# Patient Record
Sex: Female | Born: 1945 | Race: White | Hispanic: No | Marital: Single | Smoking: Former smoker
Health system: Southern US, Community
[De-identification: ages and names within clinical notes are randomized; demographics above are authoritative.]

## PROBLEM LIST (undated history)

## (undated) DIAGNOSIS — E119 Type 2 diabetes mellitus without complications: Secondary | ICD-10-CM

## (undated) DIAGNOSIS — M1990 Unspecified osteoarthritis, unspecified site: Secondary | ICD-10-CM

## (undated) DIAGNOSIS — D259 Leiomyoma of uterus, unspecified: Secondary | ICD-10-CM

## (undated) DIAGNOSIS — Z86010 Personal history of colonic polyps: Secondary | ICD-10-CM

## (undated) DIAGNOSIS — M810 Age-related osteoporosis without current pathological fracture: Secondary | ICD-10-CM

## (undated) DIAGNOSIS — D049 Carcinoma in situ of skin, unspecified: Secondary | ICD-10-CM

## (undated) DIAGNOSIS — F329 Major depressive disorder, single episode, unspecified: Secondary | ICD-10-CM

## (undated) DIAGNOSIS — Z8742 Personal history of other diseases of the female genital tract: Secondary | ICD-10-CM

## (undated) DIAGNOSIS — Z87412 Personal history of vulvar dysplasia: Secondary | ICD-10-CM

## (undated) DIAGNOSIS — N9489 Other specified conditions associated with female genital organs and menstrual cycle: Secondary | ICD-10-CM

## (undated) DIAGNOSIS — Z86007 Personal history of in-situ neoplasm of skin: Secondary | ICD-10-CM

## (undated) DIAGNOSIS — E538 Deficiency of other specified B group vitamins: Secondary | ICD-10-CM

## (undated) DIAGNOSIS — K219 Gastro-esophageal reflux disease without esophagitis: Secondary | ICD-10-CM

## (undated) DIAGNOSIS — D509 Iron deficiency anemia, unspecified: Secondary | ICD-10-CM

## (undated) DIAGNOSIS — Z9889 Other specified postprocedural states: Secondary | ICD-10-CM

## (undated) DIAGNOSIS — F909 Attention-deficit hyperactivity disorder, unspecified type: Secondary | ICD-10-CM

## (undated) DIAGNOSIS — E785 Hyperlipidemia, unspecified: Secondary | ICD-10-CM

## (undated) DIAGNOSIS — F411 Generalized anxiety disorder: Secondary | ICD-10-CM

## (undated) DIAGNOSIS — L719 Rosacea, unspecified: Secondary | ICD-10-CM

## (undated) DIAGNOSIS — Z87410 Personal history of cervical dysplasia: Secondary | ICD-10-CM

## (undated) DIAGNOSIS — K2270 Barrett's esophagus without dysplasia: Secondary | ICD-10-CM

## (undated) HISTORY — PX: COLONOSCOPY: SHX174

## (undated) HISTORY — PX: CATARACT EXTRACTION W/ INTRAOCULAR LENS  IMPLANT, BILATERAL: SHX1307

## (undated) HISTORY — DX: Carcinoma in situ of skin, unspecified: D04.9

## (undated) HISTORY — DX: Generalized anxiety disorder: F41.1

## (undated) HISTORY — PX: OVARIAN CYST REMOVAL: SHX89

## (undated) HISTORY — PX: VULVECTOMY PARTIAL: SHX6187

## (undated) HISTORY — DX: Hyperlipidemia, unspecified: E78.5

## (undated) HISTORY — PX: BREAST REDUCTION SURGERY: SHX8

## (undated) HISTORY — DX: Gastro-esophageal reflux disease without esophagitis: K21.9

## (undated) HISTORY — DX: Barrett's esophagus without dysplasia: K22.70

## (undated) HISTORY — PX: ANTERIOR CERVICAL DECOMP/DISCECTOMY FUSION: SHX1161

## (undated) HISTORY — DX: Attention-deficit hyperactivity disorder, unspecified type: F90.9

## (undated) HISTORY — DX: Major depressive disorder, single episode, unspecified: F32.9

## (undated) HISTORY — PX: LEEP: SHX91

---

## 1998-03-07 ENCOUNTER — Other Ambulatory Visit: Admission: RE | Admit: 1998-03-07 | Discharge: 1998-03-07 | Payer: Self-pay | Admitting: *Deleted

## 1999-03-14 ENCOUNTER — Other Ambulatory Visit: Admission: RE | Admit: 1999-03-14 | Discharge: 1999-03-14 | Payer: Self-pay | Admitting: *Deleted

## 1999-04-07 ENCOUNTER — Encounter (INDEPENDENT_AMBULATORY_CARE_PROVIDER_SITE_OTHER): Payer: Self-pay | Admitting: Specialist

## 1999-04-07 ENCOUNTER — Other Ambulatory Visit: Admission: RE | Admit: 1999-04-07 | Discharge: 1999-04-07 | Payer: Self-pay | Admitting: *Deleted

## 1999-05-16 ENCOUNTER — Encounter: Admission: RE | Admit: 1999-05-16 | Discharge: 1999-05-16 | Payer: Self-pay | Admitting: Family Medicine

## 1999-05-16 ENCOUNTER — Encounter: Payer: Self-pay | Admitting: Family Medicine

## 1999-09-12 ENCOUNTER — Other Ambulatory Visit: Admission: RE | Admit: 1999-09-12 | Discharge: 1999-09-12 | Payer: Self-pay | Admitting: *Deleted

## 1999-10-28 ENCOUNTER — Ambulatory Visit (HOSPITAL_COMMUNITY): Admission: RE | Admit: 1999-10-28 | Discharge: 1999-10-28 | Payer: Self-pay | Admitting: Gastroenterology

## 1999-10-28 ENCOUNTER — Encounter (INDEPENDENT_AMBULATORY_CARE_PROVIDER_SITE_OTHER): Payer: Self-pay | Admitting: *Deleted

## 2000-03-16 ENCOUNTER — Other Ambulatory Visit: Admission: RE | Admit: 2000-03-16 | Discharge: 2000-03-16 | Payer: Self-pay | Admitting: *Deleted

## 2000-03-18 ENCOUNTER — Other Ambulatory Visit: Admission: RE | Admit: 2000-03-18 | Discharge: 2000-03-18 | Payer: Self-pay | Admitting: *Deleted

## 2000-03-18 ENCOUNTER — Encounter (INDEPENDENT_AMBULATORY_CARE_PROVIDER_SITE_OTHER): Payer: Self-pay

## 2000-08-20 ENCOUNTER — Other Ambulatory Visit: Admission: RE | Admit: 2000-08-20 | Discharge: 2000-08-20 | Payer: Self-pay | Admitting: *Deleted

## 2000-08-20 ENCOUNTER — Encounter (INDEPENDENT_AMBULATORY_CARE_PROVIDER_SITE_OTHER): Payer: Self-pay | Admitting: Specialist

## 2001-03-01 ENCOUNTER — Other Ambulatory Visit: Admission: RE | Admit: 2001-03-01 | Discharge: 2001-03-01 | Payer: Self-pay | Admitting: *Deleted

## 2001-11-26 ENCOUNTER — Encounter: Admission: RE | Admit: 2001-11-26 | Discharge: 2001-11-26 | Payer: Self-pay

## 2002-03-29 ENCOUNTER — Other Ambulatory Visit: Admission: RE | Admit: 2002-03-29 | Discharge: 2002-03-29 | Payer: Self-pay | Admitting: *Deleted

## 2002-09-05 ENCOUNTER — Encounter: Admission: RE | Admit: 2002-09-05 | Discharge: 2002-09-05 | Payer: Self-pay | Admitting: Family Medicine

## 2002-09-05 ENCOUNTER — Encounter: Payer: Self-pay | Admitting: Family Medicine

## 2003-01-12 ENCOUNTER — Encounter: Admission: RE | Admit: 2003-01-12 | Discharge: 2003-01-12 | Payer: Self-pay | Admitting: Neurology

## 2003-05-29 ENCOUNTER — Other Ambulatory Visit: Admission: RE | Admit: 2003-05-29 | Discharge: 2003-05-29 | Payer: Self-pay | Admitting: *Deleted

## 2003-06-03 ENCOUNTER — Encounter: Admission: RE | Admit: 2003-06-03 | Discharge: 2003-06-03 | Payer: Self-pay | Admitting: Neurology

## 2003-06-28 ENCOUNTER — Encounter: Admission: RE | Admit: 2003-06-28 | Discharge: 2003-06-28 | Payer: Self-pay | Admitting: Neurology

## 2003-11-08 ENCOUNTER — Encounter: Admission: RE | Admit: 2003-11-08 | Discharge: 2003-11-08 | Payer: Self-pay | Admitting: Neurology

## 2004-11-24 ENCOUNTER — Ambulatory Visit (HOSPITAL_COMMUNITY): Admission: RE | Admit: 2004-11-24 | Discharge: 2004-11-24 | Payer: Self-pay | Admitting: Specialist

## 2004-11-24 ENCOUNTER — Encounter (INDEPENDENT_AMBULATORY_CARE_PROVIDER_SITE_OTHER): Payer: Self-pay | Admitting: *Deleted

## 2004-11-24 ENCOUNTER — Ambulatory Visit (HOSPITAL_BASED_OUTPATIENT_CLINIC_OR_DEPARTMENT_OTHER): Admission: RE | Admit: 2004-11-24 | Discharge: 2004-11-25 | Payer: Self-pay | Admitting: Specialist

## 2005-02-19 ENCOUNTER — Encounter: Admission: RE | Admit: 2005-02-19 | Discharge: 2005-02-19 | Payer: Self-pay | Admitting: Neurology

## 2005-03-01 ENCOUNTER — Encounter: Admission: RE | Admit: 2005-03-01 | Discharge: 2005-03-01 | Payer: Self-pay | Admitting: Neurology

## 2005-07-10 ENCOUNTER — Ambulatory Visit (HOSPITAL_COMMUNITY): Admission: RE | Admit: 2005-07-10 | Discharge: 2005-07-10 | Payer: Self-pay | Admitting: Emergency Medicine

## 2005-07-22 ENCOUNTER — Encounter: Admission: RE | Admit: 2005-07-22 | Discharge: 2005-07-22 | Payer: Self-pay | Admitting: *Deleted

## 2005-08-20 ENCOUNTER — Encounter: Admission: RE | Admit: 2005-08-20 | Discharge: 2005-08-20 | Payer: Self-pay | Admitting: Family Medicine

## 2005-10-13 ENCOUNTER — Encounter: Admission: RE | Admit: 2005-10-13 | Discharge: 2005-10-13 | Payer: Self-pay | Admitting: Neurology

## 2005-11-12 ENCOUNTER — Ambulatory Visit (HOSPITAL_COMMUNITY): Admission: RE | Admit: 2005-11-12 | Discharge: 2005-11-13 | Payer: Self-pay | Admitting: Neurosurgery

## 2006-06-14 ENCOUNTER — Encounter (INDEPENDENT_AMBULATORY_CARE_PROVIDER_SITE_OTHER): Payer: Self-pay | Admitting: Gastroenterology

## 2006-06-14 ENCOUNTER — Ambulatory Visit (HOSPITAL_COMMUNITY): Admission: RE | Admit: 2006-06-14 | Discharge: 2006-06-14 | Payer: Self-pay | Admitting: Gastroenterology

## 2006-06-14 DIAGNOSIS — Z860100 Personal history of colon polyps, unspecified: Secondary | ICD-10-CM | POA: Insufficient documentation

## 2008-03-02 ENCOUNTER — Emergency Department (HOSPITAL_COMMUNITY): Admission: EM | Admit: 2008-03-02 | Discharge: 2008-03-02 | Payer: Self-pay | Admitting: Emergency Medicine

## 2008-04-11 ENCOUNTER — Ambulatory Visit (HOSPITAL_COMMUNITY): Admission: RE | Admit: 2008-04-11 | Discharge: 2008-04-11 | Payer: Self-pay | Admitting: Interventional Cardiology

## 2008-04-11 ENCOUNTER — Encounter (INDEPENDENT_AMBULATORY_CARE_PROVIDER_SITE_OTHER): Payer: Self-pay | Admitting: Interventional Cardiology

## 2008-04-11 HISTORY — PX: TRANSTHORACIC ECHOCARDIOGRAM: SHX275

## 2008-05-04 ENCOUNTER — Ambulatory Visit (HOSPITAL_COMMUNITY): Admission: RE | Admit: 2008-05-04 | Discharge: 2008-05-04 | Payer: Self-pay | Admitting: Family Medicine

## 2009-02-25 ENCOUNTER — Encounter: Payer: Self-pay | Admitting: Internal Medicine

## 2009-03-08 ENCOUNTER — Ambulatory Visit: Payer: Self-pay | Admitting: Internal Medicine

## 2009-03-08 DIAGNOSIS — R51 Headache: Secondary | ICD-10-CM | POA: Insufficient documentation

## 2009-03-08 DIAGNOSIS — Z860100 Personal history of colon polyps, unspecified: Secondary | ICD-10-CM | POA: Insufficient documentation

## 2009-03-08 DIAGNOSIS — D509 Iron deficiency anemia, unspecified: Secondary | ICD-10-CM | POA: Insufficient documentation

## 2009-03-08 DIAGNOSIS — E785 Hyperlipidemia, unspecified: Secondary | ICD-10-CM

## 2009-03-08 DIAGNOSIS — R519 Headache, unspecified: Secondary | ICD-10-CM | POA: Insufficient documentation

## 2009-03-08 DIAGNOSIS — K219 Gastro-esophageal reflux disease without esophagitis: Secondary | ICD-10-CM | POA: Insufficient documentation

## 2009-03-08 DIAGNOSIS — F329 Major depressive disorder, single episode, unspecified: Secondary | ICD-10-CM

## 2009-03-08 HISTORY — DX: Hyperlipidemia, unspecified: E78.5

## 2009-03-08 LAB — CONVERTED CEMR LAB
ALT: 24 units/L (ref 0–35)
AST: 24 units/L (ref 0–37)
Albumin: 3.9 g/dL (ref 3.5–5.2)
Alkaline Phosphatase: 74 units/L (ref 39–117)
BUN: 9 mg/dL (ref 6–23)
Basophils Absolute: 0 10*3/uL (ref 0.0–0.1)
Basophils Relative: 0.8 % (ref 0.0–3.0)
CO2: 29 meq/L (ref 19–32)
Cholesterol, target level: 200 mg/dL
Creatinine, Ser: 0.8 mg/dL (ref 0.4–1.2)
Eosinophils Absolute: 0.2 10*3/uL (ref 0.0–0.7)
Folate: >20 ng/mL
HCT: 37.3 % (ref 36.0–46.0)
HDL goal, serum: 40 mg/dL
Hemoglobin: 12 g/dL (ref 12.0–15.0)
Iron: 36 ug/dL — ABNORMAL LOW (ref 42–145)
Ketones, ur: NEGATIVE mg/dL
Lymphocytes Relative: 29.6 % (ref 12.0–46.0)
Neutro Abs: 2.7 10*3/uL (ref 1.4–7.7)
Nitrite: NEGATIVE
Platelets: 241 10*3/uL (ref 150.0–400.0)
Potassium: 4 meq/L (ref 3.5–5.1)
RBC: 4.9 M/uL (ref 3.87–5.11)
Saturation Ratios: 8.7 % — ABNORMAL LOW (ref 20.0–50.0)
Sodium: 139 meq/L (ref 135–145)
Specific Gravity, Urine: 1.025 (ref 1.000–1.030)
Total Bilirubin: 0.5 mg/dL (ref 0.3–1.2)
Total Protein: 6.6 g/dL (ref 6.0–8.3)
Urine Glucose: NEGATIVE mg/dL
Urobilinogen, UA: 0.2 (ref 0.0–1.0)
Vitamin B-12: 828 pg/mL (ref 211–911)
pH: 5.5 (ref 5.0–8.0)

## 2009-10-07 ENCOUNTER — Encounter: Payer: Self-pay | Admitting: Internal Medicine

## 2009-10-10 ENCOUNTER — Encounter: Payer: Self-pay | Admitting: Internal Medicine

## 2009-10-10 ENCOUNTER — Ambulatory Visit: Payer: Self-pay | Admitting: Internal Medicine

## 2009-10-10 DIAGNOSIS — B029 Zoster without complications: Secondary | ICD-10-CM | POA: Insufficient documentation

## 2009-10-10 DIAGNOSIS — R1012 Left upper quadrant pain: Secondary | ICD-10-CM | POA: Insufficient documentation

## 2009-10-10 DIAGNOSIS — R079 Chest pain, unspecified: Secondary | ICD-10-CM | POA: Insufficient documentation

## 2009-10-10 LAB — CONVERTED CEMR LAB
ALT: 35 units/L (ref 0–35)
AST: 30 units/L (ref 0–37)
Albumin: 4.3 g/dL (ref 3.5–5.2)
Alkaline Phosphatase: 76 units/L (ref 39–117)
Basophils Relative: 0.4 % (ref 0.0–3.0)
Bilirubin Urine: NEGATIVE
CO2: 32 meq/L (ref 19–32)
Calcium: 10 mg/dL (ref 8.4–10.5)
Eosinophils Absolute: 0.1 10*3/uL (ref 0.0–0.7)
Eosinophils Relative: 1.3 % (ref 0.0–5.0)
GFR calc non Af Amer: 83.2 mL/min (ref >60)
Glucose, Bld: 96 mg/dL (ref 70–99)
Hemoglobin, Urine: NEGATIVE
Leukocytes, UA: NEGATIVE
Lymphocytes Relative: 28.9 % (ref 12.0–46.0)
Monocytes Relative: 9.1 % (ref 3.0–12.0)
Neutrophils Relative %: 60.3 % (ref 43.0–77.0)
Platelets: 275 10*3/uL (ref 150.0–400.0)
RDW: 13.4 % (ref 11.5–14.6)
Total Protein, Urine: NEGATIVE mg/dL
Total Protein: 7 g/dL (ref 6.0–8.3)
Urobilinogen, UA: 0.2 (ref 0.0–1.0)
WBC: 6.3 10*3/uL (ref 4.5–10.5)
pH: 8 (ref 5.0–8.0)

## 2009-11-09 ENCOUNTER — Encounter: Payer: Self-pay | Admitting: Internal Medicine

## 2009-11-09 LAB — HM MAMMOGRAPHY: HM Mammogram: NORMAL

## 2009-11-11 ENCOUNTER — Telehealth: Payer: Self-pay | Admitting: Internal Medicine

## 2009-11-20 ENCOUNTER — Encounter: Payer: Self-pay | Admitting: Internal Medicine

## 2009-12-25 ENCOUNTER — Telehealth: Payer: Self-pay | Admitting: Internal Medicine

## 2010-01-03 ENCOUNTER — Telehealth: Payer: Self-pay | Admitting: Internal Medicine

## 2010-01-03 ENCOUNTER — Ambulatory Visit: Payer: Self-pay | Admitting: Internal Medicine

## 2010-02-16 ENCOUNTER — Encounter: Payer: Self-pay | Admitting: Neurosurgery

## 2010-02-16 ENCOUNTER — Encounter: Payer: Self-pay | Admitting: Family Medicine

## 2010-02-27 NOTE — Progress Notes (Signed)
Summary: SHINGLES  Phone Note Call from Patient Call back at Home Phone [phone]   Summary of Call: Patient's insurance requires rx to get at pharm foro zostavax. Ok to provide?  Initial call taken by: Lamar Sprinkles, CMA,  December 25, 2009 2:24 PM  Follow-up for Phone Call        yes Follow-up by: Etta Grandchild MD,  December 25, 2009 2:31 PM  Additional Follow-up for Phone Call Additional follow up Details #1::        Left detailed vm for pt Additional Follow-up by: Lamar Sprinkles, CMA,  December 25, 2009 3:17 PM    New/Updated Medications: ZOSTAVAX 09811 UNT/0.65ML SOLR (ZOSTER VACCINE LIVE) as directed - administered by pharmacy or Md's office Prescriptions: ZOSTAVAX 91478 UNT/0.65ML SOLR (ZOSTER VACCINE LIVE) as directed - administered by pharmacy or Md's office  #1 x 0   Entered by:   Lamar Sprinkles, CMA   Authorized by:   Etta Grandchild MD   Signed by:   Lamar Sprinkles, CMA on 12/25/2009   Method used:   Electronically to        [facility]* (retail)       [address]       [address] Shipping/mailing       [place], [place]  [address]       Ph: [phone]       Fax: [phone]   RxID:   [id number]

## 2010-02-27 NOTE — Progress Notes (Signed)
  Phone Note Refill Request Message from:  Fax from Pharmacy on January 03, 2010 11:10 AM  Refills Requested: Medication #1:  SIMVASTATIN 40 MG TABS Take 1 tablet by mouth once a day    Prescriptions: SIMVASTATIN 40 MG TABS (SIMVASTATIN) Take 1 tablet by mouth once a day  #90 x 3   Entered by:   Rock Nephew CMA   Authorized by:   Etta Grandchild MD   Signed by:   Rock Nephew CMA on 01/03/2010   Method used:   Electronically to        [facility]* (retail)       [address]       [address] Shipping/mailing       [place], [place]  [address]       Ph: [phone]       Fax: [phone]   RxID:   [id number]

## 2010-02-27 NOTE — Letter (Signed)
Summary: Results Follow-up Letter  [facility]  [address] [place], [place] [address]   Phone: [phone]  Fax: [phone]    03/08/2009  [address] [place], [place]  [address]  Dear Ms. Fauble,   The following are the results of your recent test(s):  Test     Result     B12       normal Iron       slightly low CBC       normal Urine       normal Blood sugar     high Liver/kidney   normal Thyroid     normal   _________________________________________________________  Please call for an appointment soon for further blood sugar testing _________________________________________________________ _________________________________________________________ _________________________________________________________  Sincerely,  Sanda Linger MD [facility]

## 2010-02-27 NOTE — Progress Notes (Signed)
    PAP Screening:    Last PAP smear:  10/02/2009  Mammogram Screening:    Last Mammogram:  11/09/2009  Mammogram Results:    Date of Exam:  11/09/2009    Results:  Normal Bilateral  Osteoporosis Risk Assessment:  Risk Factors for Fracture or Low Bone Density:   Race (White or Asian):     yes   Hx of Fractures:       no   FH of Osteoporosis:     no   Hx of falls:       no   Physically inactive:     no   Smoking status:       never   High alcohol use:     no   Low calcium/Vit. D intake:   no   Corticosteroid use:     no   Heparin use:       no   Thyroid disease:     no   Rheumatoid Arthritis:     no  Immunization & Chemoprophylaxis:    Tetanus vaccine: Tdap  (10/02/2009)

## 2010-02-27 NOTE — Assessment & Plan Note (Signed)
Summary: ZOSTERVAX/INS WILL COVER PER PT-LB  Nurse Visit   Allergies: No Known Drug Allergies  Immunizations Administered:  Zostavax # 1:    Vaccine Type: Zostavax    Site: Left arm     Mfr: Merck    Dose: 0.5 ml    Route: IM    Given by: Lamar Sprinkles, CMA    Exp. Date: 08/30/2010    Lot #: [id number]    VIS given: 11/07/04 given January 03, 2010.  Orders Added: 1)  Zoster (Shingles) Vaccine Live [90736] 2)  Admin 1st Vaccine [id number]

## 2010-02-27 NOTE — Assessment & Plan Note (Signed)
Summary: NEW/ UMR / LOW IRON / NWS #   Vital Signs:  Patient profile:   65 year old female Height:      62 inches Weight:      139.50 pounds BMI:     25.61 O2 Sat:      97 % on Room air Temp:     97.9 degrees F oral Pulse rate:   88 / minute Pulse rhythm:   regular Resp:     16 per minute BP sitting:   134 / 80  (left arm) Cuff size:   large  Vitals Entered By: Rock Nephew CMA (March 08, 2009 9:16 AM)  O2 Flow:  Room air  Primary Care Provider:  Etta Grandchild MD   History of Present Illness: New to me she complains of a one year history of fatigue, anemia, and tingling in her fingers. She is a vegetarian. Her previous PCP Dr. Uvaldo Rising has done an extensive cardiac work-up but those records are not available to me. 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 She feels like it takes a long time for minor cuts to stop bleeding.  Dyspepsia History:      She has no alarm features of dyspepsia including no history of melena, hematochezia, dysphagia, persistent vomiting, or involuntary weight loss > 5%.  There is a prior history of GERD.  The patient does not have a prior history of documented ulcer disease.  The dominant symptom is heartburn or acid reflux.  An H-2 blocker medication is currently being taken.  She notes that the symptoms have improved with the H-2 blocker therapy.  Symptoms have not persisted after 4 weeks of H-2 blocker treatment.    Lipid Management History:      Positive NCEP/ATP III risk factors include female age 58 years old or older.  Negative NCEP/ATP III risk factors include no history of early menopause without estrogen hormone replacement, non-diabetic, no family history for ischemic heart disease, non-tobacco-user status, non-hypertensive, no ASHD (atherosclerotic heart disease), no prior stroke/TIA, no peripheral vascular disease, and no history of aortic aneurysm.        The patient states that she knows about the  "Therapeutic Lifestyle Change" diet.  Her compliance with the TLC diet is excellent.  The patient expresses understanding of adjunctive measures for cholesterol lowering.  Adjunctive measures started by the patient include aerobic exercise, fiber, limit alcohol consumpton, and weight reduction.  She expresses no side effects from her lipid-lowering medication.  The patient denies any symptoms to suggest myopathy or liver disease.      Preventive Screening-Counseling & Management  Alcohol-Tobacco     Smoking Status: never  Caffeine-Diet-Exercise     Does Patient Exercise: yes      Drug Use:  no.    Current Medications (verified): 1)  Simvastatin 40 Mg Tabs (Simvastatin) .... Take 1 Tablet By Mouth Once A Day 2)  Trazodone Hcl 100 Mg Tabs (Trazodone Hcl) .Marland Kitchen.. 1-1/2 Tabs 3)  Etodolac 500 Mg Tabs (Etodolac) .... As Needed 4)  Fluoxetine Hcl 40 Mg Caps (Fluoxetine Hcl) 5)  Deplin 7.5 Mg Tabs (L-Methylfolate) .... Take 1 Tablet By Mouth Once A Day 6)  Adderall 20 Mg Tabs (Amphetamine-Dextroamphetamine) .... 2 Once Daily 7)  Calcium D 8)  Vitamin D 9)  Fish Oil 10)  Methocarbamol 750 Mg Tabs (Methocarbamol) .... As Needed  Allergies (verified): No Known Drug Allergies  Past History:  Past Medical History: Anemia-NOS Colonic polyps, hx of  Depression GERD Hyperlipidemia ADHD Headache  Past Surgical History: Cervical fusion Cervical laminectomy  Family History: Family History of Alcoholism/Addiction Family History High cholesterol Family History Hypertension  Social History: Occupation: Airline pilot Divorced Never Smoked Alcohol use-no Drug use-no Regular exercise-yes Smoking Status:  never Drug Use:  no Does Patient Exercise:  yes  Review of Systems  The patient denies anorexia, fever, weight loss, weight gain, chest pain, dyspnea on exertion, peripheral edema, prolonged cough, headaches, hemoptysis, abdominal pain, melena, hematochezia, severe  indigestion/heartburn, hematuria, suspicious skin lesions, depression, unusual weight change, abnormal bleeding, enlarged lymph nodes, angioedema, and breast masses.   Heme:  Complains of bleeding; denies abnormal bruising, enlarge lymph nodes, fevers, pallor, and skin discoloration.  Physical Exam  General:  Well-developed,well-nourished,in no acute distress; alert,appropriate and cooperative throughout examination Eyes:  No corneal or conjunctival inflammation noted. EOMI. Perrla. Funduscopic exam benign, without hemorrhages, exudates or papilledema. Vision grossly normal. Mouth:  Oral mucosa and oropharynx without lesions or exudates.  Teeth in good repair. Neck:  No deformities, masses, or tenderness noted. Lungs:  Normal respiratory effort, chest expands symmetrically. Lungs are clear to auscultation, no crackles or wheezes. Heart:  Normal rate and regular rhythm. S1 and S2 normal without gallop, murmur, click, rub or other extra sounds. Abdomen:  Bowel sounds positive,abdomen soft and non-tender without masses, organomegaly or hernias noted. Rectal:  No external abnormalities noted. Normal sphincter tone. No rectal masses or tenderness. heme negative stool. Msk:  normal ROM, no joint tenderness, no joint swelling, no joint warmth, no redness over joints, no joint deformities, no joint instability, and no crepitation.   Pulses:  R and L carotid,radial,femoral,dorsalis pedis and posterior tibial pulses are full and equal bilaterally Extremities:  No clubbing, cyanosis, edema, or deformity noted with normal full range of motion of all joints.   Neurologic:  No cranial nerve deficits noted. Station and gait are normal. Plantar reflexes are down-going bilaterally. DTRs are symmetrical throughout. Sensory, motor and coordinative functions appear intact. Skin:  Intact without suspicious lesions or rashes Cervical Nodes:  no anterior cervical adenopathy and no posterior cervical adenopathy.     Axillary Nodes:  no R axillary adenopathy and no L axillary adenopathy.   Inguinal Nodes:  no R inguinal adenopathy and no L inguinal adenopathy.   Psych:  Cognition and judgment appear intact. Alert and cooperative with normal attention span and concentration. No apparent delusions, illusions, hallucinations   Impression & Recommendations:  Problem # 1:  ANEMIA-NOS (ICD-285.9) Assessment New  Orders: Venipuncture ([address]) TLB-B12 + Folate Pnl (60454_09811-B14/NWG) TLB-IBC Pnl (Iron/FE;Transferrin) (83550-IBC) TLB-BMP (Basic Metabolic Panel-BMET) (80048-METABOL) TLB-CBC Platelet - w/Differential (85025-CBCD) TLB-Hepatic/Liver Function Pnl (80076-HEPATIC) TLB-TSH (Thyroid Stimulating Hormone) (84443-TSH) TLB-Udip w/ Micro (81001-URINE) Hemoccult Guaiac-1 spec.(in office) ([address]70)  Problem # 2:  GERD (ICD-530.81) Assessment: Improved  Her updated medication list for this problem includes:    Prilosec 20 Mg Cpdr (Omeprazole) ..... Once daily  Orders: Venipuncture ([address]) TLB-B12 + Folate Pnl (30865_78469-G29/BMW) TLB-IBC Pnl (Iron/FE;Transferrin) (83550-IBC) TLB-BMP (Basic Metabolic Panel-BMET) (80048-METABOL) TLB-CBC Platelet - w/Differential (85025-CBCD) TLB-Hepatic/Liver Function Pnl (80076-HEPATIC) TLB-TSH (Thyroid Stimulating Hormone) (84443-TSH) TLB-Udip w/ Micro (81001-URINE) Hemoccult Guaiac-1 spec.(in office) (82270)  Complete Medication List: 1)  Simvastatin 40 Mg Tabs (Simvastatin) .... Take 1 tablet by mouth once a day 2)  Trazodone Hcl 100 Mg Tabs (Trazodone hcl) .Marland Kitchen.. 1-1/2 tabs 3)  Etodolac 500 Mg Tabs (Etodolac) .... As needed 4)  Fluoxetine Hcl 40 Mg Caps (Fluoxetine hcl) 5)  Deplin 7.5 Mg Tabs (L-methylfolate) .... Take 1 tablet  by mouth once a day 6)  Adderall 20 Mg Tabs (Amphetamine-dextroamphetamine) .... 2 once daily 7)  Calcium D  8)  Vitamin D  9)  Fish Oil  10)  Methocarbamol 750 Mg Tabs (Methocarbamol) .... As needed 11)  Prilosec 20 Mg Cpdr  (Omeprazole) .... Once daily  Lipid Assessment/Plan:      Based on NCEP/ATP III, the patient's risk factor category is "0-1 risk factors".  The patient's lipid goals are as follows: Total cholesterol goal is 200; LDL cholesterol goal is 160; HDL cholesterol goal is 40; Triglyceride goal is 150.    Colorectal Screening:  Colonoscopy Results:    Date of Exam: 05/09/2008    Results: Adenomatous Polyp  PAP Screening:    Hx Cervical Dysplasia in last 5 yrs? No    3 normal PAP smears in last 5 yrs? Yes    Last PAP smear:  07/11/2008  PAP Smear Results:    Date of Exam:  07/11/2008    Results:  Normal  Mammogram Screening:    Last Mammogram:  06/06/2008  Mammogram Results:    Date of Exam:  06/06/2008    Results:  Normal Bilateral  Osteoporosis Risk Assessment:  Risk Factors for Fracture or Low Bone Density:   Race (White or Asian):     yes   Hx of Fractures:       no   FH of Osteoporosis:     no   Hx of falls:       no   Physically inactive:     no   Smoking status:       never   High alcohol use:     no   High caffeine use:     no   Low calcium/Vit. D intake:   no   Corticosteroid use:     no   Thyroid replacement:     no   Dilantin use:       no   Heparin use:       no   Thyroid disease:     no   Rheumatoid Arthritis:     no   Parathyroid disease:     no  Patient Instructions: 1)  Please schedule a follow-up appointment in 2 months. 2)  Schedule your mammogram. 3)  Schedule a colonoscopy/sigmoidoscopy to help detect colon cancer. 4)  You need to have a Pap Smear to prevent cervical cancer.

## 2010-02-27 NOTE — Letter (Signed)
Summary: New Patient letter  [facility]  [address] [place], [place] [address]   Phone: [phone]  Fax: [phone]       10/07/2009 MRN: [id number]  [facility] [address] [place], [place]  [address]  Dear Ms. Brooke Dare,  Welcome to the Gastroenterology Division at [facility].    You are scheduled to see Dr.  Stan Head on Sept 19, 2011 at 1:45pm on the 3rd floor at [facility], [address]  We ask that you try to arrive at our office 15 minutes prior to your appointment time to allow for check-in.  We would like you to complete the enclosed self-administered evaluation form prior to your visit and bring it with you on the day of your appointment.  We will review it with you.  Also, please bring a complete list of all your medications or, if you prefer, bring the medication bottles and we will list them.  Please bring your insurance card so that we may make a copy of it.  If your insurance requires a referral to see a specialist, please bring your referral form from your primary care physician.  Co-payments are due at the time of your visit and may be paid by cash, check or credit card.     Your office visit will consist of a consult with your physician (includes a physical exam), any laboratory testing he/she may order, scheduling of any necessary diagnostic testing (e.g. x-ray, ultrasound, CT-scan), and scheduling of a procedure (e.g. Endoscopy, Colonoscopy) if required.  Please allow enough time on your schedule to allow for any/all of these possibilities.    If you cannot keep your appointment, please call [phone] to cancel or reschedule prior to your appointment date.  This allows [place] the opportunity to schedule an appointment for another patient in need of care.  If you do not cancel or reschedule by 5 p.m. the business day prior to your appointment date, you will be charged a $50.00 late cancellation/no-show fee.    Thank you for choosing  [facility] for your medical needs.  We appreciate the opportunity to care for you.  Please visit [place] at our website  to learn more about our practice.                     Sincerely,                                                             The Gastroenterology Division

## 2010-02-27 NOTE — Assessment & Plan Note (Signed)
Summary: stomach issues,offered sda requested 9/15/cd   Vital Signs:  Patient profile:   65 year old female Height:      62 inches Weight:      137.50 pounds O2 Sat:      96 % on Room air Temp:     98.1 degrees F oral Pulse rate:   94 / minute Pulse rhythm:   regular Resp:     16 per minute BP sitting:   118 / 82  (left arm) Cuff size:   large  Vitals Entered By: Rock Nephew CMA (October 10, 2009 9:54 AM)  O2 Flow:  Room air CC: Pt c/o Left side rib pain, Preventive Care   Primary Care Provider:  Etta Grandchild MD  CC:  Pt c/o Left side rib pain and Preventive Care.  History of Present Illness: She returns c/o a 2 week hx of left lower rib cage pain with rash and itching in that area.  Preventive Screening-Counseling & Management  Alcohol-Tobacco     Alcohol drinks/day: 0     Alcohol Counseling: not indicated; patient does not drink     Smoking Status: never     Tobacco Counseling: not indicated; no tobacco use  Hep-HIV-STD-Contraception     Hepatitis Risk: no risk noted     HIV Risk: no risk noted     STD Risk: no risk noted      Sexual History:  currently monogamous.        Drug Use:  no.        Blood Transfusions:  no.    Medications Prior to Update: 1)  Simvastatin 40 Mg Tabs (Simvastatin) .... Take 1 Tablet By Mouth Once A Day 2)  Trazodone Hcl 100 Mg Tabs (Trazodone Hcl) .Marland Kitchen.. 1-1/2 Tabs 3)  Etodolac 500 Mg Tabs (Etodolac) .... As Needed 4)  Fluoxetine Hcl 40 Mg Caps (Fluoxetine Hcl) 5)  Deplin 7.5 Mg Tabs (L-Methylfolate) .... Take 1 Tablet By Mouth Once A Day 6)  Adderall 20 Mg Tabs (Amphetamine-Dextroamphetamine) .... 2 Once Daily 7)  Calcium D 8)  Vitamin D 9)  Fish Oil 10)  Methocarbamol 750 Mg Tabs (Methocarbamol) .... As Needed 11)  Prilosec 20 Mg Cpdr (Omeprazole) .... Once Daily  Current Medications (verified): 1)  Simvastatin 40 Mg Tabs (Simvastatin) .... Take 1 Tablet By Mouth Once A Day 2)  Trazodone Hcl 100 Mg Tabs (Trazodone Hcl)  .Marland Kitchen.. 1-1/2 Tabs 3)  Etodolac 500 Mg Tabs (Etodolac) .... As Needed 4)  Fluoxetine Hcl 40 Mg Caps (Fluoxetine Hcl) 5)  Deplin 7.5 Mg Tabs (L-Methylfolate) .... Take 1 Tablet By Mouth Once A Day 6)  Adderall 20 Mg Tabs (Amphetamine-Dextroamphetamine) .... 2 Once Daily 7)  Calcium D 8)  Vitamin D 9)  Fish Oil 10)  Methocarbamol 750 Mg Tabs (Methocarbamol) .... As Needed 11)  Prilosec 20 Mg Cpdr (Omeprazole) .... Once Daily 12)  Acyclovir 800 Mg Tabs (Acyclovir) .... One By Mouth Three Times A Day For 10 Days  Allergies (verified): No Known Drug Allergies  Past History:  Past Medical History: Last updated: 03/08/2009 Anemia-NOS Colonic polyps, hx of Depression GERD Hyperlipidemia ADHD Headache  Past Surgical History: Last updated: 03/08/2009 Cervical fusion Cervical laminectomy  Family History: Last updated: 03/08/2009 Family History of Alcoholism/Addiction Family History High cholesterol Family History Hypertension  Social History: Last updated: 03/08/2009 Occupation: Psychotherapist Divorced Never Smoked Alcohol use-no Drug use-no Regular exercise-yes  Risk Factors: Alcohol Use: 0 (10/10/2009) Exercise: yes (03/08/2009)  Risk Factors: Smoking Status: never (  10/10/2009)  Family History: Reviewed history from 03/08/2009 and no changes required. Family History of Alcoholism/Addiction Family History High cholesterol Family History Hypertension  Social History: Reviewed history from 03/08/2009 and no changes required. Occupation: Psychotherapist Divorced Never Smoked Alcohol use-no Drug use-no Regular exercise-yes Hepatitis Risk:  no risk noted HIV Risk:  no risk noted STD Risk:  no risk noted Sexual History:  currently monogamous Blood Transfusions:  no  Review of Systems       The patient complains of chest pain and suspicious skin lesions.  The patient denies anorexia, fever, weight loss, weight gain, syncope, dyspnea on exertion, peripheral  edema, prolonged cough, headaches, hemoptysis, abdominal pain, melena, hematochezia, severe indigestion/heartburn, hematuria, genital sores, muscle weakness, enlarged lymph nodes, angioedema, and breast masses.   CV:  Complains of chest pain or discomfort; denies difficulty breathing at night, fainting, fatigue, leg cramps with exertion, lightheadness, near fainting, palpitations, shortness of breath with exertion, swelling of feet, swelling of hands, and weight gain. Derm:  Complains of itching and rash; denies changes in nail beds, dryness, excessive perspiration, flushing, hair loss, insect bite(s), lesion(s), and poor wound healing.  Physical Exam  General:  alert, well-developed, well-nourished, well-hydrated, appropriate dress, normal appearance, healthy-appearing, cooperative to examination, and good hygiene.   Head:  normocephalic and atraumatic.   Eyes:  vision grossly intact, pupils equal, pupils round, and pupils reactive to light.   Ears:  R ear normal and L ear normal.   Mouth:  Oral mucosa and oropharynx without lesions or exudates.  Teeth in good repair. Neck:  supple, full ROM, no masses, no thyromegaly, no thyroid nodules or tenderness, no JVD, normal carotid upstroke, no carotid bruits, no cervical lymphadenopathy, and no neck tenderness.   Chest Wall:  no deformities, no tenderness, and no mass.   Lungs:  normal respiratory effort, no intercostal retractions, no accessory muscle use, normal breath sounds, no dullness, no fremitus, no crackles, and no wheezes.   Heart:  normal rate, regular rhythm, no murmur, no gallop, no rub, and no JVD.   Abdomen:  soft, non-tender, normal bowel sounds, no distention, no masses, no guarding, no rigidity, no rebound tenderness, no abdominal hernia, no inguinal hernia, no hepatomegaly, and no splenomegaly.   Rectal:  No external abnormalities noted. Normal sphincter tone. No rectal masses or tenderness. Msk:  normal ROM, no joint tenderness, no  joint swelling, no joint warmth, no redness over joints, no joint deformities, no joint instability, no crepitation, and no muscle atrophy.   Pulses:  R and L carotid,radial,femoral,dorsalis pedis and posterior tibial pulses are full and equal bilaterally Extremities:  No clubbing, cyanosis, edema, or deformity noted with normal full range of motion of all joints.   Neurologic:  No cranial nerve deficits noted. Station and gait are normal. Plantar reflexes are down-going bilaterally. DTRs are symmetrical throughout. Sensory, motor and coordinative functions appear intact. Skin:  excessive tan.  over the left lower rib cage in a dermatomal pattern there are faint/rare/scattered excoriations and tiny scabbed papules. there are no vesicles, pustules, induration, erythema, Cervical Nodes:  no anterior cervical adenopathy and no posterior cervical adenopathy.   Axillary Nodes:  no R axillary adenopathy and no L axillary adenopathy.   Inguinal Nodes:  no R inguinal adenopathy and no L inguinal adenopathy.   Psych:  Oriented X3, memory intact for recent and remote, normally interactive, good eye contact, not depressed appearing, not agitated, not suicidal, not homicidal, and slightly anxious.   Additional Exam:  EKG is  normal.   Impression & Recommendations:  Problem # 1:  SHINGLES (ICD-053.9) Assessment New will start Acyclovir  Problem # 2:  CHEST PAIN (ICD-786.50) Assessment: New will check a Chest Xray and screen for PE. Orders: T-D-Dimer Fibrin Derivatives Quantitive [id number]) T-2 View CXR (71020TC) EKG w/ Interpretation (93000)  Problem # 3:  ABDOMINAL PAIN, LEFT UPPER QUADRANT (ICD-789.02) Assessment: New  Orders: Venipuncture (09811) TLB-BMP (Basic Metabolic Panel-BMET) (80048-METABOL) TLB-CBC Platelet - w/Differential (85025-CBCD) TLB-Hepatic/Liver Function Pnl (80076-HEPATIC) TLB-TSH (Thyroid Stimulating Hormone) (84443-TSH) TLB-Udip w/ Micro (81001-URINE) Hemoccult  Guaiac-1 spec.(in office) (82270)  Complete Medication List: 1)  Simvastatin 40 Mg Tabs (Simvastatin) .... Take 1 tablet by mouth once a day 2)  Trazodone Hcl 100 Mg Tabs (Trazodone hcl) .Marland Kitchen.. 1-1/2 tabs 3)  Etodolac 500 Mg Tabs (Etodolac) .... As needed 4)  Fluoxetine Hcl 40 Mg Caps (Fluoxetine hcl) 5)  Deplin 7.5 Mg Tabs (L-methylfolate) .... Take 1 tablet by mouth once a day 6)  Adderall 20 Mg Tabs (Amphetamine-dextroamphetamine) .... 2 once daily 7)  Calcium D  8)  Vitamin D  9)  Fish Oil  10)  Methocarbamol 750 Mg Tabs (Methocarbamol) .... As needed 11)  Prilosec 20 Mg Cpdr (Omeprazole) .... Once daily 12)  Acyclovir 800 Mg Tabs (Acyclovir) .... One by mouth three times a day for 10 days  Colorectal Screening:  Current Recommendations:    Hemoccult: NEG X 1 today  PAP Screening:    Hx Cervical Dysplasia in last 5 yrs? No    3 normal PAP smears in last 5 yrs? Yes    Last PAP smear:  10/02/2009    Reviewed PAP smear recommendations:  patient defers to GYN provider  PAP Smear Results:    Date of Exam:  10/02/2009    Results:  Normal  Mammogram Screening:    Last Mammogram:  10/01/2009    Reviewed Mammogram recommendations:  mammogram not due yet  Mammogram Results:    Date of Exam:  10/01/2009    Results:  Normal Bilateral  Osteoporosis Risk Assessment:  Risk Factors for Fracture or Low Bone Density:   Race (White or Asian):     yes   Hx of Fractures:       no   FH of Osteoporosis:     no   Hx of falls:       no   Physically inactive:     no   Smoking status:       never   High alcohol use:     no   Low calcium/Vit. D intake:   no   Corticosteroid use:     no   Heparin use:       no   Thyroid disease:     no   Rheumatoid Arthritis:     no  Immunization & Chemoprophylaxis:    Tetanus vaccine: Tdap  (10/02/2009)  Patient Instructions: 1)  Please schedule a follow-up appointment in 2 weeks. 2)  Take 650-1000mg  of Tylenol every 4-6 hours as needed for relief  of pain or comfort of fever AVOID taking more than 4000mg   in a 24 hour period (can cause liver damage in higher doses). Prescriptions: ACYCLOVIR 800 MG TABS (ACYCLOVIR) One by mouth three times a day for 10 days  #30 x 1   Entered and Authorized by:   Etta Grandchild MD   Signed by:   Etta Grandchild MD on 10/10/2009   Method used:   Electronically to  [facility]* (retail)       [address]       [address] Shipping/mailing       [place], [place]  [address]       Ph: [phone]       Fax: [phone]   RxID:   [id number]    Tetanus/Td Immunization History:    Tetanus/Td # 1:  Tdap (10/02/2009)

## 2010-02-27 NOTE — Letter (Signed)
Summary: [facility] No Show Letter  [facility]  [address] [place], [place] [address]   Phone: [phone]  Fax: [phone]      November 20, 2009 MRN: [id number] [facility] [address] [place], [place]  [address]   [facility] [address] [place], [place] [address]     You were scheduled for an Consultation appointment with Dr.Gessner on 10-14-09 at  [facility] but you did not keep the appointment.    Your provider recommended this procedure for the benefit of your health.  It is very important that you reschedule it.  Failure to do so may be to the detriment of your health.  Please call us at [phone] and we will be happy to assist you with rescheduling.    If you were referred for this procedure by another physician/provider, we will notify him/her that you did not keep your appointment.   Sincerely,  [facility]

## 2010-06-10 NOTE — Op Note (Signed)
NAME:  Jill Chandler, Jill Chandler                  ACCOUNT NO.:  [id number]   MEDICAL RECORD NO.:  [id number]          PATIENT TYPE:  AMB   LOCATION:  ENDO                         FACILITY:  [facility]   PHYSICIAN:  Anselmo Rod, M.D.  DATE OF BIRTH:  15-Nov-1945   DATE OF PROCEDURE:  06/14/2006  DATE OF DISCHARGE:                               OPERATIVE REPORT   PROCEDURE PERFORMED:  Colonoscopy with cold biopsies x3.   ENDOSCOPIST:  Anselmo Rod, M.D.   INSTRUMENT USED:  Pentax video colonoscope.   INDICATIONS FOR PROCEDURE:  A 65 year old white female undergoing  screening colonoscopy to rule out colonic polyps, mass, etc.   PREPROCEDURE PREPARATION:  Informed consent was procured from the  patient.  The patient fasted for 4 hours prior to the procedure and  prepped with 20 Osmoprep pills the night of and 12 Osmoprep pills the  morning of the procedure.  Risks and benefits of the procedure including  a 10% miss rate of cancer and polyps were discussed with the patient as  well.   PREPROCEDURE PHYSICAL:  VITAL SIGNS:  The patient had stable vital  signs.  NECK:  Supple.  CHEST:  Clear to auscultation.  HEART:  S1 and S2 regular.  ABDOMEN:  Soft with normal bowel sounds.   DESCRIPTION OF PROCEDURE:  The patient was placed in the left lateral  decubitus position and sedated with a 125 mcg of Fentanyl and 10 mg of  Versed given intravenously in slow incremental doses.  Once the patient  was adequately sedated and maintained on low-flow oxygen and continuous  cardiac monitoring, the Pentax video colonoscope was advanced from the  rectum to the cecum.  There was evidence of diverticulosis with more  prominent changes in the left colon and small internal hemorrhoids were  seen on retroflexion.  Two polyps were biopsied from the proximal right  colon, one close to the cecum and one from the mid right colon.  The  terminal ileum appeared healthy without lesions.  Retroflexion in the  rectum  revealed internal hemorrhoids as mentioned above.  The patient  tolerated the procedure well without immediate complications.   IMPRESSION:  1. Scattered diverticulosis, more prominent changes in the sigmoid      colon.  2. Two the small sessile polyps biopsied from the proximal right colon      and one from the mid right colon.  3. Small internal hemorrhoids seen on retroflexion.  4. Normal terminal ileum.   RECOMMENDATIONS:  1. Await pathology results.  2. Avoid all nonsteroidals including aspirin for the next 2 weeks.  3. Repeat colonoscopy depending on pathology results.  4. Continue high fiber diet with liberal fluid intake.  5. Outpatient follow-up as need arises in the future.      Anselmo Rod, M.D.  Electronically Signed     JNM/MEDQ  D:  06/14/2006  T:  06/15/2006  Job:  [id number]   cc:   Pam Drown, M.D.

## 2010-06-13 NOTE — Op Note (Signed)
NAME:  Jill Chandler, Jill Chandler                  ACCOUNT NO.:  [id number]   MEDICAL RECORD NO.:  [id number]          PATIENT TYPE:  OIB   LOCATION:  3016                         FACILITY:  [facility]   PHYSICIAN:  Danae Orleans. Venetia Maxon, M.D.  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   PROCEDURE:  Anterior cervical decompression and fusion, C3-4, with PEEK  interbody cage, morcellized bone autograft and anterior cervical plate.   SURGEON:  Danae Orleans. Venetia Maxon, M.D.   ASSISTANTS:  Cristi Loron, M.D.; Poteat, RN.   ANESTHESIA:  General endotracheal anesthesia.   ESTIMATED BLOOD LOSS:  Minimal.   COMPLICATIONS:  None.   DISPOSITION:  To recovery.   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  She has a mobile spondylolisthesis of C3 on C4, with  significant right foraminal stenosis.  We have elected to take her to  surgery for anterior cervical decompression and fusion at the C3-4 level.   DESCRIPTION OF PROCEDURE:  Jill Chandler was brought to the operating room.  Following the satisfactory and uncomplicated induction of general  endotracheal anesthesia and placement of an intravenous line, she was placed  in the supine position on the operating room table.  Her neck was placed in  slight extension.  She was placed in 5 pounds of halter traction and her  anterior neck was then prepped and draped in the usual sterile fashion.  The  area of the planned incision was infiltrated with 0.25% Marcaine and 0.5%  lidocaine with 1:200,000 epinephrine.  Incision was made from the midline to  the anterior border of the sternocleidomastoid on the  left side of the  midline.  This was carried through the platysma layer, and then, using blunt  dissection, the carotid sheath was kept lateral, and the trachea and  esophagus kept medial, exposing the anterior cervical spine.  A bent spinal  needle was placed at what was felt to be the C3-4 level, and this was  confirmed on intraoperative x-ray.  Subsequently, the longus colli muscles  were taken down from the anterior cervical spine using the electrocautery,  key elevator and the self-retaining Shadowline retractors, placed to  facilitate exposure.  The interspace at C3-4 was then incised with a 15  blade and disk material was removed in a piecemeal fashion.  Endplates were  stripped of residual disk material.  Disk space spreaders were placed, and  under loupe magnification using a high-speed drill, the endplates were  decorticated a C3 and C4 and uncinate spurs were drilled down, particularly  on the right side of the midline.  This bone was saved for later use in bone  grafting.  Subsequently, a microscope was brought into the field.  The  posterior longitudinal ligament was then incised with an arachnoid  knife and  removed in piecemeal fashion, and the spinal cord dura and both C4 nerve  roots were decompressed as they extended through the neural foramina.  Hemostasis was assured with Gelfoam soaked in thrombin.  After trial sizing,  a 7-mm PEEK interbody cage was selected, packed with morcellized bone  autograft, inserted in the interspace and countersunk appropriately.  Subsequently, the traction weight was removed and a 12-mm Trestle anterior  cervical plate was affixed to the anterior cervical spine using 12-mm  variable-angle screws, 2 at C3 and 2 at C4.  All screws had excellent  purchase.  Locking mechanisms were engaged.  Final x-ray demonstrated a well-  positioned interbody graft and anterior cervical plate.  The soft tissues  were inspected and found to be in good repair,  and the wound was irrigated  with Bacitracin and saline.  The platysma layer was consulted with 3-0  Vicryl suture.  The skin edges were approximated with 3-0 Vicryl  interrupted, inverted sutures.  The wound was dressed with Dermabond.  The  patient was extubated in the operating room and taken to the recovery room  in stable, satisfactory condition, having tolerated the operation well.  All  counts were correct at the end of the case.      Danae Orleans. Venetia Maxon, M.D.  Electronically Signed     JDS/MEDQ  D:  11/12/2005  T:  11/13/2005  Job:  [id number]   cc:   Cristi Loron, M.D.

## 2010-06-13 NOTE — Op Note (Signed)
[place]  Patient:    Jill Chandler, Jill Chandler                         MRN: [id number] Proc. Date: 10/29/99 Adm. Date:  [id number] Attending:  Charna Elizabeth CC:         Pershing Cox, M.D.   Operative Report  DATE OF BIRTH:  [birth year].  PROCEDURE PERFORMED:  Colonoscopy with hot biopsy x 1.  ENDOSCOPIST:  Anselmo Rod, M.D.  INSTRUMENT:  Olympus video colonoscope.  INDICATION FOR PROCEDURE:  Blood in stool and left lower quadrant pain in a 65 year old female.  Rule out colonic polyps, masses, hemorrhoids, etc.  PREPROCEDURE PREPARATION:  Informed consent was procured from the patient. The patient was fasting for eight hours prior to the procedure and prepped with a bottle of magnesium citrate and a gallon of NuLYTELY the night prior to the procedure.  PREPROCEDURE PHYSICAL:  VITAL SIGNS:  Stable.  NECK:  Supple.  CHEST:  Clear to auscultation.  HEART:  S1, S2 regular.  ABDOMEN:  Soft with normal abdominal bowel sounds.  DESCRIPTION OF THE PROCEDURE:  The patient was placed in the left lateral decubitus position and sedated with 75 mg of Demerol and 5 mg of Versed intravenously.  Once the patient was adequately sedated and maintained on low-flow oxygen and continuous cardiac monitoring, the Olympus video colonoscope was advanced from the rectum to the cecum without difficulty.  A small sessile polyp was removed by hot biopsy at 10 cm.  There were a few left-sided diverticula present.  No other abnormalities were seen up to the cecum.  IMPRESSION: 1. One small sessile polyp removed by hot biopsy at 10 cm. 2. Few left-sided diverticulum. 3. Normal-appearing transverse colon, right colon, and cecum.  RECOMMENDATIONS: 1. Await pathology results. 1. Increase the fluid and fiber in diet. 2. Outpatient followup in the next two weeks. DD:  10/29/99 TD:  10/29/99 Job: [id number]

## 2010-06-13 NOTE — Op Note (Signed)
NAME:  LYVONNE, CASSELL                  ACCOUNT NO.:  [id number]   MEDICAL RECORD NO.:  [id number]          PATIENT TYPE:  AMB   LOCATION:  DSC                          FACILITY:  [facility]   PHYSICIAN:  Earvin Hansen L. Shon Hough, M.D.DATE OF BIRTH:  Oct 06, 1945   DATE OF PROCEDURE:  11/24/2004  DATE OF DISCHARGE:                                 OPERATIVE REPORT   INDICATIONS FOR PROCEDURE:  This is a 65 year old lady who has severe  macromastia, back and shoulder pain secondary to large pendulous breasts,  history of intertriginous changes as well as pain increased on the neck and  back areas.  All alternative conservative treatments have been attempted and  failed.   PROCEDURE:  Bilateral breast reductions using the inferior pedicle  technique.   ANESTHESIA:  General.   DESCRIPTION OF PROCEDURE:  Preoperatively, the patient was sat up and drawn  for the inferior pedicle reduction mammoplasty.  She will be marked at 20 cm  from the suprasternal notch.  She then underwent general anesthesia and was  intubated orally.  Prep was done to the chest, breasts, neck, and abdominal  areas using Hibiclens soap and solution and walled off with sterile towels  and drapes so as to make a sterile field.   Xylocaine 4% with epinephrine was injected locally for vasoconstriction,  1:400,000 concentration, a total of 150 cc per side.  We allowed this to  vasoconstrict.  The incisions were the made with #15 blades.  The skin over  the inferior pedicle was deepithelialized with #20 blades.  Medial and  lateral fatty dermal pedicles were excised down the underlying fascia.  After hemostasis, the new keyhole area was also debulked out laterally.  More accessory breast tissue was taken to improve symmetry.  After  hemostasis and irrigation, the flaps were transposed and stayed with 3-0  Prolene.  Subcutaneous closure was done with 3-0 Monocryl x2 layers and then  a running subcuticular stitch of 3-0 Monocryl and 5-0  Monocryl throughout  the inverted T.  The wounds were drained with a #10 fully fluted JP drain  which was placed in the depths of the wound and brought out through the  lateral-most portion of the incision and secured with 3-0 Prolene.  The  wounds were cleansed.  Steri-Strips and soft dressings were applied,  including Xeroform, 4 x 4s, ABD, and Hypafix tape which was taped and cut.  At the end of the procedure, the nipple-areolar complexes were examined,  showing excellent blood supply with pink coloration.   She was then taken to recovery in excellent condition.      Yaakov Guthrie. Shon Hough, M.D.  Electronically Signed     GLT/MEDQ  D:  11/24/2004  T:  11/24/2004  Job:  [id number]

## 2010-12-16 LAB — HM DIABETES EYE EXAM

## 2011-01-13 ENCOUNTER — Encounter: Payer: Self-pay | Admitting: Internal Medicine

## 2011-01-13 ENCOUNTER — Other Ambulatory Visit (INDEPENDENT_AMBULATORY_CARE_PROVIDER_SITE_OTHER): Payer: PRIVATE HEALTH INSURANCE

## 2011-01-13 ENCOUNTER — Ambulatory Visit (INDEPENDENT_AMBULATORY_CARE_PROVIDER_SITE_OTHER): Payer: PRIVATE HEALTH INSURANCE | Admitting: Internal Medicine

## 2011-01-13 ENCOUNTER — Ambulatory Visit (INDEPENDENT_AMBULATORY_CARE_PROVIDER_SITE_OTHER)
Admission: RE | Admit: 2011-01-13 | Discharge: 2011-01-13 | Disposition: A | Discharge status: Home / Self Care | Payer: PRIVATE HEALTH INSURANCE | Source: Ambulatory Visit | Attending: Internal Medicine | Admitting: Internal Medicine

## 2011-01-13 VITALS — BP 138/82 | HR 90 | Temp 98.7°F | Resp 16 | Wt 137.0 lb

## 2011-01-13 DIAGNOSIS — R059 Cough, unspecified: Secondary | ICD-10-CM | POA: Insufficient documentation

## 2011-01-13 DIAGNOSIS — D539 Nutritional anemia, unspecified: Secondary | ICD-10-CM | POA: Insufficient documentation

## 2011-01-13 DIAGNOSIS — R05 Cough: Secondary | ICD-10-CM

## 2011-01-13 DIAGNOSIS — Z23 Encounter for immunization: Secondary | ICD-10-CM

## 2011-01-13 DIAGNOSIS — D649 Anemia, unspecified: Secondary | ICD-10-CM

## 2011-01-13 DIAGNOSIS — E785 Hyperlipidemia, unspecified: Secondary | ICD-10-CM

## 2011-01-13 DIAGNOSIS — J209 Acute bronchitis, unspecified: Secondary | ICD-10-CM | POA: Insufficient documentation

## 2011-01-13 LAB — IBC PANEL
Iron: 31 ug/dL — ABNORMAL LOW (ref 42–145)
Saturation Ratios: 8.5 % — ABNORMAL LOW (ref 20.0–50.0)
Transferrin: 259.5 mg/dL (ref 212.0–360.0)

## 2011-01-13 LAB — CBC WITH DIFFERENTIAL/PLATELET
Basophils Absolute: 0 10*3/uL (ref 0.0–0.1)
Eosinophils Absolute: 0.1 10*3/uL (ref 0.0–0.7)
HCT: 42.3 % (ref 36.0–46.0)
Hemoglobin: 14.5 g/dL (ref 12.0–15.0)
Lymphs Abs: 0.9 10*3/uL (ref 0.7–4.0)
MCHC: 34.3 g/dL (ref 30.0–36.0)
Monocytes Relative: 12.4 % — ABNORMAL HIGH (ref 3.0–12.0)
Neutro Abs: 4 10*3/uL (ref 1.4–7.7)
RDW: 13.5 % (ref 11.5–14.6)

## 2011-01-13 LAB — COMPREHENSIVE METABOLIC PANEL
ALT: 33 U/L (ref 0–35)
BUN: 7 mg/dL (ref 6–23)
CO2: 28 mEq/L (ref 19–32)
Creatinine, Ser: 0.8 mg/dL (ref 0.4–1.2)
GFR: 79.2 mL/min (ref >60.00)
Total Bilirubin: 0.5 mg/dL (ref 0.3–1.2)

## 2011-01-13 LAB — TSH: TSH: 1.57 u[IU]/mL (ref 0.35–5.50)

## 2011-01-13 MED ORDER — PSEUDOEPH-CHLORPHEN-HYDROCOD 60-4-5 MG/5ML PO SOLN: 5.0000 mL | Freq: Four times a day (QID) | ORAL | Status: DC | PRN

## 2011-01-13 MED ORDER — MOXIFLOXACIN HCL 400 MG PO TABS: 400.0000 mg | ORAL_TABLET | Freq: Every day | ORAL | Status: AC

## 2011-01-13 NOTE — Progress Notes (Signed)
  Subjective:    Patient ID: Jill Chandler, female    DOB: 1945-04-27, 65 y.o.   MRN: [id number]  Anemia Presents for follow-up visit. Symptoms include malaise/fatigue. There has been no abdominal pain, anorexia, bruising/bleeding easily, confusion, fever, leg swelling, light-headedness, pallor, palpitations, paresthesias, pica or weight loss. Signs of blood loss that are not present include hematemesis, hematochezia, melena and vaginal bleeding. There are no compliance problems.   Cough This is a new problem. Episode onset: for 4 weeks. The problem has been gradually worsening. The problem occurs every few minutes. The cough is productive of purulent sputum. Associated symptoms include chills, myalgias, shortness of breath and sweats. Pertinent negatives include no chest pain, ear congestion, ear pain, fever, headaches, heartburn, hemoptysis, nasal congestion, postnasal drip, rash, rhinorrhea, sore throat, weight loss or wheezing. The symptoms are aggravated by nothing. She has tried OTC cough suppressant for the symptoms. The treatment provided no relief.      Review of Systems  Constitutional: Positive for chills, malaise/fatigue and fatigue. Negative for fever, weight loss, diaphoresis, activity change, appetite change and unexpected weight change.  HENT: Negative for ear pain, congestion, sore throat, facial swelling, rhinorrhea, sneezing, trouble swallowing, neck pain, neck stiffness, voice change, postnasal drip and sinus pressure.   Eyes: Negative.   Respiratory: Positive for cough and shortness of breath. Negative for hemoptysis and wheezing.   Cardiovascular: Negative for chest pain, palpitations and leg swelling.  Gastrointestinal: Negative for heartburn, nausea, vomiting, abdominal pain, diarrhea, constipation, melena, hematochezia, anorexia and hematemesis.  Genitourinary: Negative for dysuria, urgency, frequency, hematuria, decreased urine volume, vaginal bleeding, enuresis, difficulty  urinating and dyspareunia.  Musculoskeletal: Positive for myalgias. Negative for back pain, joint swelling, arthralgias and gait problem.  Skin: Negative for color change, pallor, rash and wound.  Neurological: Negative for dizziness, tremors, seizures, syncope, facial asymmetry, speech difficulty, weakness, light-headedness, numbness, headaches and paresthesias.  Hematological: Negative for adenopathy. Does not bruise/bleed easily.  Psychiatric/Behavioral: Negative.  Negative for confusion.       Objective:   Physical Exam  Vitals reviewed. Constitutional: She is oriented to person, place, and time. She appears well-developed and well-nourished. No distress.  HENT:  Head: Normocephalic and atraumatic.  Mouth/Throat: Oropharynx is clear and moist. No oropharyngeal exudate.  Eyes: Conjunctivae are normal. Right eye exhibits no discharge. Left eye exhibits no discharge. No scleral icterus.  Neck: Normal range of motion. Neck supple. No JVD present. No tracheal deviation present. No thyromegaly present.  Cardiovascular: Normal rate, regular rhythm, normal heart sounds and intact distal pulses.  Exam reveals no gallop and no friction rub.   No murmur heard. Pulmonary/Chest: Effort normal and breath sounds normal. No stridor. No respiratory distress. She has no wheezes. She has no rales. She exhibits no tenderness.  Abdominal: Soft. Bowel sounds are normal. She exhibits no distension and no mass. There is no tenderness. There is no rebound and no guarding.  Musculoskeletal: Normal range of motion. She exhibits no edema and no tenderness.  Lymphadenopathy:    She has no cervical adenopathy.  Neurological: She is oriented to person, place, and time.  Skin: Skin is warm and dry. No rash noted. She is not diaphoretic. No erythema. No pallor.  Psychiatric: She has a normal mood and affect. Her behavior is normal. Judgment and thought content normal.          Assessment & Plan:

## 2011-01-13 NOTE — Assessment & Plan Note (Signed)
Start avelox for the infection and zutripro for the cough

## 2011-01-13 NOTE — Patient Instructions (Signed)
Acute Bronchitis You have acute bronchitis. This means you have a chest cold. The airways in your lungs are red and sore (inflamed). Acute means it is sudden onset.  CAUSES Bronchitis is most often caused by the same virus that causes a cold. SYMPTOMS   Body aches.   Chest congestion.   Chills.   Cough.   Fever.   Shortness of breath.   Sore throat.  TREATMENT  Acute bronchitis is usually treated with rest, fluids, and medicines for relief of fever or cough. Most symptoms should go away after a few days or a week. Increased fluids may help thin your secretions and will prevent dehydration. Your caregiver may give you an inhaler to improve your symptoms. The inhaler reduces shortness of breath and helps control cough. You can take over-the-counter pain relievers or cough medicine to decrease coughing, pain, or fever. A cool-air vaporizer may help thin bronchial secretions and make it easier to clear your chest. Antibiotics are usually not needed but can be prescribed if you smoke, are seriously ill, have chronic lung problems, are elderly, or you are at higher risk for developing complications.Allergies and asthma can make bronchitis worse. Repeated episodes of bronchitis may cause longstanding lung problems. Avoid smoking and secondhand smoke.Exposure to cigarette smoke or irritating chemicals will make bronchitis worse. If you are a cigarette smoker, consider using nicotine gum or skin patches to help control withdrawal symptoms. Quitting smoking will help your lungs heal faster. Recovery from bronchitis is often slow, but you should start feeling better after 2 to 3 days. Cough from bronchitis frequently lasts for 3 to 4 weeks. To prevent another bout of acute bronchitis:  Quit smoking.   Wash your hands frequently to get rid of viruses or use a hand sanitizer.   Avoid other people with cold or virus symptoms.   Try not to touch your hands to your mouth, nose, or eyes.  SEEK  IMMEDIATE MEDICAL CARE IF:  You develop increased fever, chills, or chest pain.   You have severe shortness of breath or bloody sputum.   You develop dehydration, fainting, repeated vomiting, or a severe headache.   You have no improvement after 1 week of treatment or you get worse.  MAKE SURE YOU:   Understand these instructions.   Will watch your condition.   Will get help right away if you are not doing well or get worse.  Document Released: 02/20/2004 Document Revised: 09/24/2010 Document Reviewed: 05/07/2010 [facility] Patient Information 2012 [place], [place].Anemia, Nonspecific Your exam and blood tests show you are anemic. This means your blood (hemoglobin) level is low. Normal hemoglobin values are 12 to 15 g/dL for females and 14 to 17 g/dL for males. Make a note of your hemoglobin level today. The hematocrit percent is also used to measure anemia. A normal hematocrit is 38% to 46% in females and 42% to 49% in males. Make a note of your hematocrit level today. CAUSES  Anemia can be due to many different causes.  Excessive bleeding from periods (in women).   Intestinal bleeding.   Poor nutrition.   Kidney, thyroid, liver, and bone marrow diseases.  SYMPTOMS  Anemia can come on suddenly (acute). It can also come on slowly. Symptoms can include:  Minor weakness.   Dizziness.   Palpitations.   Shortness of breath.  Symptoms may be absent until half your hemoglobin is missing if it comes on slowly. Anemia due to acute blood loss from an injury or internal bleeding may require  blood transfusion if the loss is severe. Hospital care is needed if you are anemic and there is significant continual blood loss. TREATMENT   Stool tests for blood (Hemoccult) and additional lab tests are often needed. This determines the best treatment.   Further checking on your condition and your response to treatment is very important. It often takes many weeks to correct anemia.  Depending on  the cause, treatment can include:  Supplements of iron.   Vitamins B12 and folic acid.   Hormone medicines.If your anemia is due to bleeding, finding the cause of the blood loss is very important. This will help avoid further problems.  SEEK IMMEDIATE MEDICAL CARE IF:   You develop fainting, extreme weakness, shortness of breath, or chest pain.   You develop heavy vaginal bleeding.   You develop bloody or black, tarry stools or vomit up blood.   You develop a high fever, rash, repeated vomiting, or dehydration.  Document Released: 02/20/2004 Document Revised: 09/24/2010 Document Reviewed: 11/27/2008 [facility] Patient Information 2012 [place], [place].

## 2011-01-13 NOTE — Assessment & Plan Note (Signed)
I will check a CXR to look for pna, mass, edema, etc. 

## 2011-01-13 NOTE — Assessment & Plan Note (Signed)
She is doing well on simvastatin 

## 2011-01-13 NOTE — Assessment & Plan Note (Signed)
I will check her CBC and will look at her vitamin levels as well

## 2011-03-12 ENCOUNTER — Ambulatory Visit (INDEPENDENT_AMBULATORY_CARE_PROVIDER_SITE_OTHER): Payer: PRIVATE HEALTH INSURANCE | Admitting: Internal Medicine

## 2011-03-12 ENCOUNTER — Other Ambulatory Visit (INDEPENDENT_AMBULATORY_CARE_PROVIDER_SITE_OTHER): Payer: PRIVATE HEALTH INSURANCE

## 2011-03-12 ENCOUNTER — Encounter: Payer: Self-pay | Admitting: Internal Medicine

## 2011-03-12 VITALS — BP 148/90 | HR 94 | Temp 98.2°F | Resp 16 | Wt 138.5 lb

## 2011-03-12 DIAGNOSIS — R9431 Abnormal electrocardiogram [ECG] [EKG]: Secondary | ICD-10-CM | POA: Insufficient documentation

## 2011-03-12 DIAGNOSIS — D649 Anemia, unspecified: Secondary | ICD-10-CM

## 2011-03-12 DIAGNOSIS — R079 Chest pain, unspecified: Secondary | ICD-10-CM

## 2011-03-12 LAB — COMPREHENSIVE METABOLIC PANEL
AST: 25 U/L (ref 0–37)
Alkaline Phosphatase: 72 U/L (ref 39–117)
BUN: 15 mg/dL (ref 6–23)
Creatinine, Ser: 0.8 mg/dL (ref 0.4–1.2)
Total Bilirubin: 0.5 mg/dL (ref 0.3–1.2)

## 2011-03-12 LAB — CBC WITH DIFFERENTIAL/PLATELET
Basophils Relative: 0.4 % (ref 0.0–3.0)
Eosinophils Absolute: 0.1 10*3/uL (ref 0.0–0.7)
Hemoglobin: 14.6 g/dL (ref 12.0–15.0)
MCHC: 33.6 g/dL (ref 30.0–36.0)
MCV: 83 fl (ref 78.0–100.0)
Monocytes Absolute: 0.6 10*3/uL (ref 0.1–1.0)
Neutro Abs: 4.5 10*3/uL (ref 1.4–7.7)
RBC: 5.23 Mil/uL — ABNORMAL HIGH (ref 3.87–5.11)
RDW: 14.6 % (ref 11.5–14.6)

## 2011-03-12 LAB — CARDIAC PANEL
CK-MB: 1.2 ng/mL (ref 0.3–4.0)
Relative Index: 1.5 calc (ref 0.0–2.5)
Total CK: 82 U/L (ref 7–177)

## 2011-03-12 LAB — BRAIN NATRIURETIC PEPTIDE: Pro B Natriuretic peptide (BNP): 7 pg/mL (ref 0.0–100.0)

## 2011-03-12 LAB — LIPID PANEL
Cholesterol: 174 mg/dL (ref 0–200)
HDL: 51.6 mg/dL (ref >39.00)

## 2011-03-12 NOTE — Assessment & Plan Note (Signed)
I will recheck her CBC 

## 2011-03-12 NOTE — Patient Instructions (Signed)
Chest Pain (Nonspecific) It is often hard to give a specific diagnosis for the cause of chest pain. There is always a chance that your pain could be related to something serious, such as a heart attack or a blood clot in the lungs. You need to follow up with your caregiver for further evaluation. CAUSES   Heartburn.   Pneumonia or bronchitis.   Anxiety and stress.   Inflammation around your heart (pericarditis) or lung (pleuritis or pleurisy).   A blood clot in the lung.   A collapsed lung (pneumothorax). It can develop suddenly on its own (spontaneous pneumothorax) or from injury (trauma) to the chest.  The chest wall is composed of bones, muscles, and cartilage. Any of these can be the source of the pain.  The bones can be bruised by injury.   The muscles or cartilage can be strained by coughing or overwork.   The cartilage can be affected by inflammation and become sore (costochondritis).  DIAGNOSIS  Lab tests or other studies, such as X-rays, an EKG, stress testing, or cardiac imaging, may be needed to find the cause of your pain.  TREATMENT   Treatment depends on what may be causing your chest pain. Treatment may include:   Acid blockers for heartburn.   Anti-inflammatory medicine.   Pain medicine for inflammatory conditions.   Antibiotics if an infection is present.   You may be advised to change lifestyle habits. This includes stopping smoking and avoiding caffeine and chocolate.   You may be advised to keep your head raised (elevated) when sleeping. This reduces the chance of acid going backward from your stomach into your esophagus.   Most of the time, nonspecific chest pain will improve within 2 to 3 days with rest and mild pain medicine.  HOME CARE INSTRUCTIONS   If antibiotics were prescribed, take the full amount even if you start to feel better.   For the next few days, avoid physical activities that bring on chest pain. Continue physical activities as  directed.   Do not smoke cigarettes or drink alcohol until your symptoms are gone.   Only take over-the-counter or prescription medicine for pain, discomfort, or fever as directed by your caregiver.   Follow your caregiver's suggestions for further testing if your chest pain does not go away.   Keep any follow-up appointments you made. If you do not go to an appointment, you could develop lasting (chronic) problems with pain. If there is any problem keeping an appointment, you must call to reschedule.  SEEK MEDICAL CARE IF:   You think you are having problems from the medicine you are taking. Read your medicine instructions carefully.   Your chest pain does not go away, even after treatment.   You develop a rash with blisters on your chest.  SEEK IMMEDIATE MEDICAL CARE IF:   You have increased chest pain or pain that spreads to your arm, neck, jaw, back, or belly (abdomen).   You develop shortness of breath, an increasing cough, or you are coughing up blood.   You have severe back or abdominal pain, feel sick to your stomach (nauseous) or throw up (vomit).   You develop severe weakness, fainting, or chills.   You have an oral temperature above 102 F (38.9 C), not controlled by medicine.  THIS IS AN EMERGENCY. Do not wait to see if the pain will go away. Get medical help at once. Call your local emergency services (911 in [place] Do not drive yourself to   the hospital. MAKE SURE YOU:   Understand these instructions.   Will watch your condition.   Will get help right away if you are not doing well or get worse.  Document Released: 10/22/2004 Document Revised: 09/24/2010 Document Reviewed: 08/18/2007 [employer] Patient Information 2012 [employer]. 

## 2011-03-12 NOTE — Progress Notes (Signed)
  Subjective:    Patient ID: Jill Chandler, female    DOB: [birth year], 66 y.o.   MRN: [id number]  Chest Pain  This is a chronic problem. The current episode started more than 1 year ago. The onset quality is gradual. The problem occurs intermittently. The problem has been unchanged. The pain is present in the lateral region (under her breastbone and to the right side of her chest). The pain is at a severity of 1/10. The pain is mild. The quality of the pain is described as squeezing. The pain radiates to the right jaw. Pertinent negatives include no abdominal pain, back pain, claudication, cough, diaphoresis, dizziness, exertional chest pressure, fever, headaches, hemoptysis, irregular heartbeat, leg pain, lower extremity edema, malaise/fatigue, nausea, near-syncope, numbness, orthopnea, palpitations, PND, shortness of breath, sputum production, syncope, vomiting or weakness. She has tried antacids for the symptoms. The treatment provided mild relief.  Pertinent negatives for past medical history include no seizures.      Review of Systems  Constitutional: Negative for fever, chills, malaise/fatigue, diaphoresis, activity change, appetite change, fatigue and unexpected weight change.  HENT: Negative.   Eyes: Negative.   Respiratory: Negative for apnea, cough, hemoptysis, sputum production, choking, chest tightness, shortness of breath, wheezing and stridor.   Cardiovascular: Positive for chest pain. Negative for palpitations, orthopnea, claudication, leg swelling, syncope, PND and near-syncope.  Gastrointestinal: Negative for nausea, vomiting, abdominal pain, diarrhea, constipation, blood in stool, abdominal distention, anal bleeding and rectal pain.  Genitourinary: Negative.   Musculoskeletal: Negative for myalgias, back pain, joint swelling, arthralgias and gait problem.  Skin: Negative for color change, pallor, rash and wound.  Neurological: Negative for dizziness, tremors, seizures, syncope,  facial asymmetry, speech difficulty, weakness, light-headedness, numbness and headaches.  Hematological: Negative for adenopathy. Does not bruise/bleed easily.  Psychiatric/Behavioral: Negative.        Objective:   Physical Exam  Vitals reviewed. Constitutional: She is oriented to person, place, and time. She appears well-developed and well-nourished. No distress.  HENT:  Head: Normocephalic and atraumatic.  Mouth/Throat: Oropharynx is clear and moist. No oropharyngeal exudate.  Eyes: Conjunctivae are normal. Right eye exhibits no discharge. Left eye exhibits no discharge. No scleral icterus.  Neck: Normal range of motion. Neck supple. No JVD present. No tracheal deviation present. No thyromegaly present.  Cardiovascular: Normal rate, regular rhythm, normal heart sounds and intact distal pulses.  Exam reveals no gallop and no friction rub.   No murmur heard. Pulmonary/Chest: Effort normal and breath sounds normal. No stridor. No respiratory distress. She has no wheezes. She has no rales. She exhibits no tenderness.  Abdominal: Soft. Bowel sounds are normal. She exhibits no distension and no mass. There is no tenderness. There is no rebound and no guarding.  Musculoskeletal: Normal range of motion. She exhibits no edema and no tenderness.  Lymphadenopathy:    She has no cervical adenopathy.  Neurological: She is oriented to person, place, and time.  Skin: Skin is warm and dry. No rash noted. She is not diaphoretic. No erythema. No pallor.  Psychiatric: She has a normal mood and affect. Her behavior is normal. Judgment and thought content normal.          Assessment & Plan:

## 2011-03-12 NOTE — Assessment & Plan Note (Addendum)
[facility] CMP TSH today 

## 2011-03-12 NOTE — Assessment & Plan Note (Signed)
EKG shows some artifact but there is also a loss of voltage in V1 and V2 but there is no evidence of acute ischemis, her pain is atypical so I will check labs today to look for ischemia, PE, CHF, abnormal lytes. Also will ask her to see cardiology

## 2011-03-13 ENCOUNTER — Institutional Professional Consult (permissible substitution): Payer: PRIVATE HEALTH INSURANCE | Admitting: Cardiology

## 2011-03-13 LAB — D-DIMER, QUANTITATIVE: D-Dimer, Quant: 0.28 ug/mL-FEU (ref 0.00–0.48)

## 2011-03-18 ENCOUNTER — Encounter: Payer: Self-pay | Admitting: Internal Medicine

## 2011-04-02 ENCOUNTER — Telehealth: Payer: Self-pay | Admitting: *Deleted

## 2011-04-02 NOTE — Telephone Encounter (Signed)
I agree, I asked her to return for further testing in a letter after her recent labs

## 2011-04-02 NOTE — Telephone Encounter (Signed)
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 They suggest she has A1C. Please advise.

## 2011-04-06 NOTE — Telephone Encounter (Signed)
Called pt, she stated she would call back when she knew her schedule for the next little while.  Thanks!

## 2011-04-06 NOTE — Telephone Encounter (Signed)
Please set up appt for pt

## 2011-04-10 ENCOUNTER — Encounter: Payer: Self-pay | Admitting: Internal Medicine

## 2011-04-10 ENCOUNTER — Other Ambulatory Visit (INDEPENDENT_AMBULATORY_CARE_PROVIDER_SITE_OTHER): Payer: PRIVATE HEALTH INSURANCE

## 2011-04-10 ENCOUNTER — Ambulatory Visit (INDEPENDENT_AMBULATORY_CARE_PROVIDER_SITE_OTHER): Payer: PRIVATE HEALTH INSURANCE | Admitting: Internal Medicine

## 2011-04-10 VITALS — BP 120/84 | HR 85 | Temp 97.6°F | Resp 16 | Wt 139.0 lb

## 2011-04-10 DIAGNOSIS — E118 Type 2 diabetes mellitus with unspecified complications: Secondary | ICD-10-CM

## 2011-04-10 DIAGNOSIS — R7309 Other abnormal glucose: Secondary | ICD-10-CM

## 2011-04-10 DIAGNOSIS — D649 Anemia, unspecified: Secondary | ICD-10-CM

## 2011-04-10 DIAGNOSIS — E785 Hyperlipidemia, unspecified: Secondary | ICD-10-CM

## 2011-04-10 HISTORY — DX: Type 2 diabetes mellitus with unspecified complications: E11.8

## 2011-04-10 LAB — TRIGLYCERIDES: Triglycerides: 255 mg/dL — ABNORMAL HIGH (ref 0.0–149.0)

## 2011-04-10 LAB — BASIC METABOLIC PANEL
BUN: 10 mg/dL (ref 6–23)
Calcium: 9.8 mg/dL (ref 8.4–10.5)
Creatinine, Ser: 0.8 mg/dL (ref 0.4–1.2)
GFR: 75.76 mL/min (ref >60.00)

## 2011-04-10 LAB — HM DIABETES FOOT EXAM: HM Diabetic Foot Exam: NORMAL

## 2011-04-10 NOTE — Assessment & Plan Note (Signed)
This has improved with her iron replacement therapy

## 2011-04-10 NOTE — Assessment & Plan Note (Signed)
Diabetic/nutrition referral. a1c today, pt ed material today

## 2011-04-10 NOTE — Patient Instructions (Signed)
Hypertriglyceridemia  Diet for High blood levels of Triglycerides Most fats in food are triglycerides. Triglycerides in your blood are stored as fat in your body. High levels of triglycerides in your blood may put you at a greater risk for heart disease and stroke.  Normal triglyceride levels are less than 150 mg/dL. Borderline high levels are 150-199 mg/dl. High levels are 200 - 499 mg/dL, and very high triglyceride levels are greater than 500 mg/dL. The decision to treat high triglycerides is generally based on the level. For people with borderline or high triglyceride levels, treatment includes weight loss and exercise. Drugs are recommended for people with very high triglyceride levels. Many people who need treatment for high triglyceride levels have metabolic syndrome. This syndrome is a collection of disorders that often include: insulin resistance, high blood pressure, blood clotting problems, high cholesterol and triglycerides. TESTING PROCEDURE FOR TRIGLYCERIDES  You should not eat 4 hours before getting your triglycerides measured. The normal range of triglycerides is between 10 and 250 milligrams per deciliter (mg/dl). Some people may have extreme levels (1000 or above), but your triglyceride level may be too high if it is above 150 mg/dl, depending on what other risk factors you have for heart disease.   People with high blood triglycerides may also have high blood cholesterol levels. If you have high blood cholesterol as well as high blood triglycerides, your risk for heart disease is probably greater than if you only had high triglycerides. High blood cholesterol is one of the main risk factors for heart disease.  CHANGING YOUR DIET  Your weight can affect your blood triglyceride level. If you are more than 20% above your ideal body weight, you may be able to lower your blood triglycerides by losing weight. Eating less and exercising regularly is the best way to combat this. Fat provides  more calories than any other food. The best way to lose weight is to eat less fat. Only 30% of your total calories should come from fat. Less than 7% of your diet should come from saturated fat. A diet low in fat and saturated fat is the same as a diet to decrease blood cholesterol. By eating a diet lower in fat, you may lose weight, lower your blood cholesterol, and lower your blood triglyceride level.  Eating a diet low in fat, especially saturated fat, may also help you lower your blood triglyceride level. Ask your dietitian to help you figure how much fat you can eat based on the number of calories your caregiver has prescribed for you.  Exercise, in addition to helping with weight loss may also help lower triglyceride levels.   Alcohol can increase blood triglycerides. You may need to stop drinking alcoholic beverages.   Too much carbohydrate in your diet may also increase your blood triglycerides. Some complex carbohydrates are necessary in your diet. These may include bread, rice, potatoes, other starchy vegetables and cereals.   Reduce "simple" carbohydrates. These may include pure sugars, candy, honey, and jelly without losing other nutrients. If you have the kind of high blood triglycerides that is affected by the amount of carbohydrates in your diet, you will need to eat less sugar and less high-sugar foods. Your caregiver can help you with this.   Adding 2-4 grams of fish oil (EPA+ DHA) may also help lower triglycerides. Speak with your caregiver before adding any supplements to your regimen.  Following the Diet  Maintain your ideal weight. Your caregivers can help you with a diet. Generally,   eating less food and getting more exercise will help you lose weight. Joining a weight control group may also help. Ask your caregivers for a good weight control group in your area.  Eat low-fat foods instead of high-fat foods. This can help you lose weight too.  These foods are lower in fat. Eat MORE  of these:   Dried beans, peas, and lentils.   Egg whites.   Low-fat cottage cheese.   Fish.   Lean cuts of meat, such as round, sirloin, rump, and flank (cut extra fat off meat you fix).   Whole grain breads, cereals and pasta.   Skim and nonfat dry milk.   Low-fat yogurt.   Poultry without the skin.   Cheese made with skim or part-skim milk, such as mozzarella, parmesan, farmers', ricotta, or pot cheese.  These are higher fat foods. Eat LESS of these:   Whole milk and foods made from whole milk, such as American, blue, cheddar, monterey jack, and swiss cheese   High-fat meats, such as luncheon meats, sausages, knockwurst, bratwurst, hot dogs, ribs, corned beef, ground pork, and regular ground beef.   Fried foods.  Limit saturated fats in your diet. Substituting unsaturated fat for saturated fat may decrease your blood triglyceride level. You will need to read package labels to know which products contain saturated fats.  These foods are high in saturated fat. Eat LESS of these:   Fried pork skins.   Whole milk.   Skin and fat from poultry.   Palm oil.   Butter.   Shortening.   Cream cheese.   Bacon.   Margarines and baked goods made from listed oils.   Vegetable shortenings.   Chitterlings.   Fat from meats.   Coconut oil.   Palm kernel oil.   Lard.   Cream.   Sour cream.   Fatback.   Coffee whiteners and non-dairy creamers made with these oils.   Cheese made from whole milk.  Use unsaturated fats (both polyunsaturated and monounsaturated) moderately. Remember, even though unsaturated fats are better than saturated fats; you still want a diet low in total fat.  These foods are high in unsaturated fat:   Canola oil.   Sunflower oil.   Mayonnaise.   Almonds.   Peanuts.   Pine nuts.   Margarines made with these oils.   Safflower oil.   Olive oil.   Avocados.   Cashews.   Peanut butter.   Sunflower seeds.   Soybean oil.     Peanut oil.   Olives.   Pecans.   Walnuts.   Pumpkin seeds.  Avoid sugar and other high-sugar foods. This will decrease carbohydrates without decreasing other nutrients. Sugar in your food goes rapidly to your blood. When there is excess sugar in your blood, your liver may use it to make more triglycerides. Sugar also contains calories without other important nutrients.  Eat LESS of these:   Sugar, brown sugar, powdered sugar, jam, jelly, preserves, honey, syrup, molasses, pies, candy, cakes, cookies, frosting, pastries, colas, soft drinks, punches, fruit drinks, and regular gelatin.   Avoid alcohol. Alcohol, even more than sugar, may increase blood triglycerides. In addition, alcohol is high in calories and low in nutrients. Ask for sparkling water, or a diet soft drink instead of an alcoholic beverage.  Suggestions for planning and preparing meals   Bake, broil, grill or roast meats instead of frying.   Remove fat from meats and skin from poultry before cooking.   Add spices,   herbs, lemon juice or vinegar to vegetables instead of salt, rich sauces or gravies.   Use a non-stick skillet without fat or use no-stick sprays.   Cool and refrigerate stews and broth. Then remove the hardened fat floating on the surface before serving.   Refrigerate meat drippings and skim off fat to make low-fat gravies.   Serve more fish.   Use less butter, margarine and other high-fat spreads on bread or vegetables.   Use skim or reconstituted non-fat dry milk for cooking.   Cook with low-fat cheeses.   Substitute low-fat yogurt or cottage cheese for all or part of the sour cream in recipes for sauces, dips or congealed salads.   Use half yogurt/half mayonnaise in salad recipes.   Substitute evaporated skim milk for cream. Evaporated skim milk or reconstituted non-fat dry milk can be whipped and substituted for whipped cream in certain recipes.   Choose fresh fruits for dessert instead of  high-fat foods such as pies or cakes. Fruits are naturally low in fat.  When Dining Out   Order low-fat appetizers such as fruit or vegetable juice, pasta with vegetables or tomato sauce.   Select clear, rather than cream soups.   Ask that dressings and gravies be served on the side. Then use less of them.   Order foods that are baked, broiled, poached, steamed, stir-fried, or roasted.   Ask for margarine instead of butter, and use only a small amount.   Drink sparkling water, unsweetened tea or coffee, or diet soft drinks instead of alcohol or other sweet beverages.  QUESTIONS AND ANSWERS ABOUT OTHER FATS IN THE BLOOD: SATURATED FAT, TRANS FAT, AND CHOLESTEROL What is trans fat? Trans fat is a type of fat that is formed when vegetable oil is hardened through a process called hydrogenation. This process helps makes foods more solid, gives them shape, and prolongs their shelf life. Trans fats are also called hydrogenated or partially hydrogenated oils.  What do saturated fat, trans fat, and cholesterol in foods have to do with heart disease? Saturated fat, trans fat, and cholesterol in the diet all raise the level of LDL "bad" cholesterol in the blood. The higher the LDL cholesterol, the greater the risk for coronary heart disease (CHD). Saturated fat and trans fat raise LDL similarly.  What foods contain saturated fat, trans fat, and cholesterol? High amounts of saturated fat are found in animal products, such as fatty cuts of meat, chicken skin, and full-fat dairy products like butter, whole milk, cream, and cheese, and in tropical vegetable oils such as palm, palm kernel, and coconut oil. Trans fat is found in some of the same foods as saturated fat, such as vegetable shortening, some margarines (especially hard or stick margarine), crackers, cookies, baked goods, fried foods, salad dressings, and other processed foods made with partially hydrogenated vegetable oils. Small amounts of trans fat  also occur naturally in some animal products, such as milk products, beef, and lamb. Foods high in cholesterol include liver, other organ meats, egg yolks, shrimp, and full-fat dairy products. How can I use the new food label to make heart-healthy food choices? Check the Nutrition Facts panel of the food label. Choose foods lower in saturated fat, trans fat, and cholesterol. For saturated fat and cholesterol, you can also use the Percent Daily Value (%DV): 5% DV or less is low, and 20% DV or more is high. (There is no %DV for trans fat.) Use the Nutrition Facts panel to choose foods low in   saturated fat and cholesterol, and if the trans fat is not listed, read the ingredients and limit products that list shortening or hydrogenated or partially hydrogenated vegetable oil, which tend to be high in trans fat. POINTS TO REMEMBER: YOU NEED A LITTLE TLC (THERAPEUTIC LIFESTYLE CHANGES)  Discuss your risk for heart disease with your caregivers, and take steps to reduce risk factors.   Change your diet. Choose foods that are low in saturated fat, trans fat, and cholesterol.   Add exercise to your daily routine if it is not already being done. Participate in physical activity of moderate intensity, like brisk walking, for at least 30 minutes on most, and preferably all days of the week. No time? Break the 30 minutes into three, 10-minute segments during the day.   Stop smoking. If you do smoke, contact your caregiver to discuss ways in which they can help you quit.   Do not use street drugs.   Maintain a normal weight.   Maintain a healthy blood pressure.   Keep up with your blood work for checking the fats in your blood as directed by your caregiver.  Document Released: 10/31/2003 Document Revised: 01/01/2011 Document Reviewed: 05/28/2008 [employer] Patient Information 2012 [place], [place].A1c, Hemoglobin A1c The A1c (hemoglobin A1c, glycosylated hemoglobin) test checks the average amount of sugar  (glucose) in the blood over the last 2 to 3 months. It does this by measuring the concentration of glycosylated hemoglobin. As glucose circulates in the blood, some of it binds to hemoglobin A. This is the main form of hemoglobin in adults. Hemoglobin is a red protein that carries oxygen in the red blood cells (RBCs). Once the glucose is bound to the hemoglobin A, it remains there for the life of the red blood cell (about 120 days). This combination of glucose and hemoglobin A is called A1c. Increased glucose in the blood, increases the hemoglobin A1c. A1c levels do not change quickly but will shift as RBCs are replaced. A1c is a valuable test because it enables you to know how your glucose has been controlled over the past 3 months.  5% A1c   Estimated Average Glucose mg/dL: 97   6% Z6X   Estimated Average Glucose mg/dL: 096   7% E4V   Estimated Average Glucose mg/dL: 409   8% W1X   Estimated Average Glucose mg/dL: 914   9% N8G   Estimated Average Glucose mg/dL: 956   21% H0Q   Estimated Average Glucose mg/dL: 657   84% O9G   Estimated Average Glucose mg/dL: 295   28% U1L   Estimated Average Glucose mg/dL: 244  The American Diabetes Association (ADA) recommends testing your A1c level 4 times each year if you have type 1 or type 2 diabetes and use insulin; or 2 times each year if you have type 2 diabetes and do not use insulin. When someone is first diagnosed with diabetes or if control is not good, A1c may be ordered more frequently. PREPARATION FOR TEST No preparation or fasting is necessary for this blood sample. NORMAL FINDINGS   Adults without diabetes: 2.2 to 4.8%   Children without diabetes: 1.8 to 4.0%   Good diabetic control: 2.5 to 5.9%   Fair diabetic control: 6 to 8%   Poor diabetic control: greater than 8%  The values of A1c may be falsely low in pregnancy, in disorders with shortened red blood cell lives, or in sickle cell disease or trait (carrier). The  values may be falsely high in disorders  with longer red cell lives, or in people with Thallassemia, kidney failure, or iron deficiency anemia. Ranges for normal findings may vary among different laboratories and hospitals. You should always check with your doctor after having lab work or other tests done to discuss the meaning of your test results and whether your values are considered within normal limits. MEANING OF TEST  Your caregiver will go over the test results with you and discuss the importance and meaning of your results, as well as treatment options and the need for additional tests if necessary. If your A1c is greater than 7%, discuss treatment options with your caregiver. OBTAINING THE TEST RESULTS  It is your responsibility to obtain your test results. Ask the lab or department performing the test when and how you will get your results. Document Released: 02/04/2004 Document Revised: 01/01/2011 Document Reviewed: 12/17/2007 [facility] Patient Information 2012 [place], [place].Diabetes, Type 2 Diabetes is a long-lasting (chronic) disease. In type 2 diabetes, the pancreas does not make enough insulin (a hormone), and the body does not respond normally to the insulin that is made. This type of diabetes was also previously called adult-onset diabetes. It usually occurs after the age of 72, but it can occur at any age.  CAUSES  Type 2 diabetes happens because the pancreasis not making enough insulin or your body has trouble using the insulin that your pancreas does make properly. SYMPTOMS   Drinking more than usual.   Urinating more than usual.   Blurred vision.   Dry, itchy skin.   Frequent infections.   Feeling more tired than usual (fatigue).  DIAGNOSIS The diagnosis of type 2 diabetes is usually made by one of the following tests:  Fasting blood glucose test. You will not eat for at least 8 hours and then take a blood test.   Random blood glucose test. Your blood glucose  (sugar) is checked at any time of the day regardless of when you ate.   Oral glucose tolerance test (OGTT). Your blood glucose is measured after you have not eaten (fasted) and then after you drink a glucose containing beverage.  TREATMENT   Healthy eating.   Exercise.   Medicine, if needed.   Monitoring blood glucose.   Seeing your caregiver regularly.  HOME CARE INSTRUCTIONS   Check your blood glucose at least once a day. More frequent monitoring may be necessary, depending on your medicines and on how well your diabetes is controlled. Your caregiver will advise you.   Take your medicine as directed by your caregiver.   Do not smoke.   Make wise food choices. Ask your caregiver for information. Weight loss can improve your diabetes.   Learn about low blood glucose (hypoglycemia) and how to treat it.   Get your eyes checked regularly.   Have a yearly physical exam. Have your blood pressure checked and your blood and urine tested.   Wear a pendant or bracelet saying that you have diabetes.   Check your feet every night for cuts, sores, blisters, and redness. Let your caregiver know if you have any problems.  SEEK MEDICAL CARE IF:   You have problems keeping your blood glucose in target range.   You have problems with your medicines.   You have symptoms of an illness that do not improve after 24 hours.   You have a sore or wound that is not healing.   You notice a change in vision or a new problem with your vision.   You have  a fever.  MAKE SURE YOU:  Understand these instructions.   Will watch your condition.   Will get help right away if you are not doing well or get worse.  Document Released: 01/12/2005 Document Revised: 01/01/2011 Document Reviewed: 06/30/2010 [facility] Patient Information 2012 [place], [place].

## 2011-04-10 NOTE — Assessment & Plan Note (Signed)
Nutrition referral, pt ed material today, recheck trigs today

## 2011-04-10 NOTE — Progress Notes (Signed)
  Subjective:    Patient ID: Jill Chandler, female    DOB: [birth year], 66 y.o.   MRN: [id number]  Hyperlipidemia This is a new problem. The current episode started more than 1 month ago. The problem is uncontrolled. Recent lipid tests were reviewed and are variable. Exacerbating diseases include diabetes. She has no history of chronic renal disease, hypothyroidism, liver disease, obesity or nephrotic syndrome. Factors aggravating her hyperlipidemia include fatty foods. Pertinent negatives include no chest pain, focal sensory loss, focal weakness, leg pain, myalgias or shortness of breath. Treatments tried: omega 3's. The current treatment provides moderate improvement of lipids. Compliance problems include adherence to exercise and adherence to diet.       Review of Systems  Constitutional: Positive for fatigue. Negative for fever, chills, diaphoresis, activity change, appetite change and unexpected weight change.  HENT: Negative.   Eyes: Negative.   Respiratory: Negative for apnea, cough, choking, chest tightness, shortness of breath, wheezing and stridor.   Cardiovascular: Negative for chest pain, palpitations and leg swelling.  Gastrointestinal: Negative for nausea, vomiting, abdominal pain, diarrhea, constipation, abdominal distention and rectal pain.  Genitourinary: Negative.   Musculoskeletal: Negative for myalgias, back pain, joint swelling, arthralgias and gait problem.  Skin: Negative for color change, pallor, rash and wound.  Neurological: Negative for dizziness, tremors, focal weakness, seizures, syncope, facial asymmetry, speech difficulty, weakness, light-headedness, numbness and headaches.  Hematological: Negative for adenopathy. Does not bruise/bleed easily.  Psychiatric/Behavioral: Negative.        Objective:   Physical Exam  Vitals reviewed. Constitutional: She is oriented to person, place, and time. She appears well-developed and well-nourished. No distress.  HENT:    Head: Normocephalic and atraumatic.  Mouth/Throat: Oropharynx is clear and moist. No oropharyngeal exudate.  Eyes: Conjunctivae are normal. Right eye exhibits no discharge. Left eye exhibits no discharge. No scleral icterus.  Neck: Normal range of motion. Neck supple. No JVD present. No tracheal deviation present. No thyromegaly present.  Cardiovascular: Normal rate, regular rhythm, normal heart sounds and intact distal pulses.  Exam reveals no gallop and no friction rub.   No murmur heard. Pulmonary/Chest: Effort normal and breath sounds normal. No stridor. No respiratory distress. She has no wheezes. She has no rales. She exhibits no tenderness.  Abdominal: Soft. Bowel sounds are normal. She exhibits no distension and no mass. There is no tenderness. There is no rebound and no guarding.  Musculoskeletal: Normal range of motion. She exhibits no edema and no tenderness.  Lymphadenopathy:    She has no cervical adenopathy.  Neurological: She is oriented to person, place, and time.  Skin: Skin is warm and dry. No rash noted. She is not diaphoretic. No erythema. No pallor.  Psychiatric: She has a normal mood and affect. Her behavior is normal. Judgment and thought content normal.      Lab Results  Component Value Date   WBC 7.1 03/12/2011   HGB 14.6 03/12/2011   HCT 43.4 03/12/2011   PLT 224.0 03/12/2011   GLUCOSE 138* 03/12/2011   CHOL 174 03/12/2011   TRIG 284.0* 03/12/2011   HDL 51.60 03/12/2011   LDLDIRECT 87.1 03/12/2011   ALT 29 03/12/2011   AST 25 03/12/2011   NA 140 03/12/2011   K 4.4 03/12/2011   CL 103 03/12/2011   CREATININE 0.8 03/12/2011   BUN 15 03/12/2011   CO2 29 03/12/2011   TSH 1.29 03/12/2011      Assessment & Plan:

## 2011-05-06 ENCOUNTER — Ambulatory Visit: Payer: PRIVATE HEALTH INSURANCE | Admitting: *Deleted

## 2011-07-13 ENCOUNTER — Encounter: Payer: Self-pay | Admitting: Internal Medicine

## 2011-10-29 ENCOUNTER — Ambulatory Visit (INDEPENDENT_AMBULATORY_CARE_PROVIDER_SITE_OTHER): Payer: PRIVATE HEALTH INSURANCE | Admitting: *Deleted

## 2011-10-29 DIAGNOSIS — Z23 Encounter for immunization: Secondary | ICD-10-CM

## 2011-11-26 ENCOUNTER — Encounter: Payer: Self-pay | Admitting: Internal Medicine

## 2011-11-27 ENCOUNTER — Encounter: Payer: Self-pay | Admitting: Internal Medicine

## 2012-02-08 ENCOUNTER — Other Ambulatory Visit: Payer: Self-pay

## 2012-02-08 MED ORDER — SIMVASTATIN 40 MG PO TABS: 40.0000 mg | ORAL_TABLET | Freq: Every day | ORAL | Status: DC

## 2012-02-29 ENCOUNTER — Encounter: Payer: Self-pay | Admitting: Internal Medicine

## 2012-02-29 ENCOUNTER — Ambulatory Visit (INDEPENDENT_AMBULATORY_CARE_PROVIDER_SITE_OTHER): Payer: BC Managed Care – PPO | Admitting: Internal Medicine

## 2012-02-29 ENCOUNTER — Other Ambulatory Visit (INDEPENDENT_AMBULATORY_CARE_PROVIDER_SITE_OTHER): Payer: BC Managed Care – PPO

## 2012-02-29 VITALS — BP 112/72 | HR 80 | Temp 98.4°F | Resp 16 | Ht 62.0 in | Wt 147.0 lb

## 2012-02-29 DIAGNOSIS — E785 Hyperlipidemia, unspecified: Secondary | ICD-10-CM

## 2012-02-29 DIAGNOSIS — IMO0001 Reserved for inherently not codable concepts without codable children: Secondary | ICD-10-CM

## 2012-02-29 LAB — MICROALBUMIN / CREATININE URINE RATIO
Creatinine,U: 189.4 mg/dL
Microalb, Ur: 0.3 mg/dL (ref 0.0–1.9)

## 2012-02-29 LAB — COMPREHENSIVE METABOLIC PANEL
ALT: 38 U/L — ABNORMAL HIGH (ref 0–35)
Albumin: 4 g/dL (ref 3.5–5.2)
CO2: 27 mEq/L (ref 19–32)
Calcium: 9.5 mg/dL (ref 8.4–10.5)
Chloride: 102 mEq/L (ref 96–112)
Creatinine, Ser: 0.8 mg/dL (ref 0.4–1.2)
GFR: 77.76 mL/min (ref >60.00)
Potassium: 4.1 mEq/L (ref 3.5–5.1)
Sodium: 138 mEq/L (ref 135–145)
Total Protein: 6.8 g/dL (ref 6.0–8.3)

## 2012-02-29 LAB — CBC WITH DIFFERENTIAL/PLATELET
Basophils Absolute: 0 10*3/uL (ref 0.0–0.1)
Hemoglobin: 14 g/dL (ref 12.0–15.0)
Lymphocytes Relative: 24.2 % (ref 12.0–46.0)
Monocytes Relative: 9.7 % (ref 3.0–12.0)
Neutro Abs: 4.2 10*3/uL (ref 1.4–7.7)
Neutrophils Relative %: 63.3 % (ref 43.0–77.0)
RDW: 14 % (ref 11.5–14.6)

## 2012-02-29 LAB — URINALYSIS, ROUTINE W REFLEX MICROSCOPIC
Hgb urine dipstick: NEGATIVE
Nitrite: NEGATIVE
Urobilinogen, UA: 1 (ref 0.0–1.0)

## 2012-02-29 LAB — TSH: TSH: 1.66 u[IU]/mL (ref 0.35–5.50)

## 2012-02-29 LAB — LDL CHOLESTEROL, DIRECT: Direct LDL: 93.3 mg/dL

## 2012-02-29 MED ORDER — SIMVASTATIN 40 MG PO TABS: 40.0000 mg | ORAL_TABLET | Freq: Every day | ORAL | Status: DC

## 2012-02-29 NOTE — Progress Notes (Signed)
Subjective:    Patient ID: Jill Chandler, female    DOB: 1945-03-07, 67 y.o.   MRN: [id number]  Diabetes She presents for her follow-up diabetic visit. She has type 2 diabetes mellitus. Her disease course has been stable. There are no hypoglycemic associated symptoms. Pertinent negatives for hypoglycemia include no pallor. Associated symptoms include polydipsia, polyphagia and polyuria. Pertinent negatives for diabetes include no blurred vision, no chest pain, no fatigue, no foot paresthesias, no foot ulcerations, no visual change, no weakness and no weight loss. There are no hypoglycemic complications. Symptoms are stable. There are no diabetic complications. Current diabetic treatment includes diet. She is compliant with treatment most of the time. Her weight is increasing steadily. She is following a generally unhealthy diet. When asked about meal planning, she reported none. She has not had a previous visit with a dietician. She never participates in exercise. There is no change in her home blood glucose trend. An ACE inhibitor/angiotensin II receptor blocker is not being taken. She does not see a podiatrist.Eye exam is current.      Review of Systems  Constitutional: Positive for unexpected weight change (some weight gain). Negative for fever, chills, weight loss, diaphoresis, activity change, appetite change and fatigue.  HENT: Negative.   Eyes: Negative.  Negative for blurred vision.  Respiratory: Negative for apnea, cough, choking, chest tightness, shortness of breath, wheezing and stridor.   Cardiovascular: Negative for chest pain, palpitations and leg swelling.  Gastrointestinal: Negative for vomiting, abdominal pain, diarrhea, constipation, blood in stool and rectal pain.  Genitourinary: Positive for polyuria.  Musculoskeletal: Negative for myalgias, back pain, joint swelling, arthralgias and gait problem.  Skin: Negative for color change, pallor, rash and wound.  Neurological:  Negative.  Negative for weakness.  Hematological: Positive for polydipsia and polyphagia. Negative for adenopathy. Does not bruise/bleed easily.  Psychiatric/Behavioral: Negative.        Objective:   Physical Exam  Vitals reviewed. Constitutional: She is oriented to person, place, and time. She appears well-developed and well-nourished. No distress.  HENT:  Head: Normocephalic and atraumatic.  Mouth/Throat: Oropharynx is clear and moist. No oropharyngeal exudate.  Eyes: Conjunctivae normal are normal. Right eye exhibits no discharge. Left eye exhibits no discharge. No scleral icterus.  Neck: Normal range of motion. Neck supple. No JVD present. No tracheal deviation present. No thyromegaly present.  Cardiovascular: Normal rate, regular rhythm, normal heart sounds and intact distal pulses.  Exam reveals no gallop and no friction rub.   No murmur heard. Pulmonary/Chest: Effort normal and breath sounds normal. No stridor. No respiratory distress. She has no wheezes. She has no rales. She exhibits no tenderness.  Abdominal: Soft. Bowel sounds are normal. She exhibits no distension and no mass. There is no tenderness. There is no rebound and no guarding.  Musculoskeletal: Normal range of motion. She exhibits no edema and no tenderness.  Lymphadenopathy:    She has no cervical adenopathy.  Neurological: She is oriented to person, place, and time.  Skin: Skin is warm and dry. No rash noted. She is not diaphoretic. No erythema. No pallor.  Psychiatric: She has a normal mood and affect. Her behavior is normal. Judgment and thought content normal.     Lab Results  Component Value Date   WBC 7.1 03/12/2011   HGB 14.6 03/12/2011   HCT 43.4 03/12/2011   PLT 224.0 03/12/2011   GLUCOSE 111* 04/10/2011   CHOL 174 03/12/2011   TRIG 255.0* 04/10/2011   HDL 51.60 03/12/2011  LDLDIRECT 87.1 03/12/2011   ALT 29 03/12/2011   AST 25 03/12/2011   NA 139 04/10/2011   K 4.4 04/10/2011   CL 104 04/10/2011    CREATININE 0.8 04/10/2011   BUN 10 04/10/2011   CO2 29 04/10/2011   TSH 1.29 03/12/2011   HGBA1C 6.7* 04/10/2011       Assessment & Plan:

## 2012-02-29 NOTE — Assessment & Plan Note (Signed)
She is doing well on simvastatin FLP TSH CMP today

## 2012-02-29 NOTE — Patient Instructions (Addendum)

## 2012-02-29 NOTE — Assessment & Plan Note (Signed)
I will recheck her A1C today and will monitor her renal function 

## 2012-03-01 ENCOUNTER — Encounter: Payer: Self-pay | Admitting: Internal Medicine

## 2012-03-02 ENCOUNTER — Other Ambulatory Visit: Payer: Self-pay

## 2012-03-02 DIAGNOSIS — E785 Hyperlipidemia, unspecified: Secondary | ICD-10-CM

## 2012-03-02 DIAGNOSIS — IMO0001 Reserved for inherently not codable concepts without codable children: Secondary | ICD-10-CM

## 2012-03-02 MED ORDER — SIMVASTATIN 40 MG PO TABS: 40.0000 mg | ORAL_TABLET | Freq: Every day | ORAL | Status: DC

## 2012-03-07 ENCOUNTER — Ambulatory Visit (INDEPENDENT_AMBULATORY_CARE_PROVIDER_SITE_OTHER): Payer: BC Managed Care – PPO | Admitting: Internal Medicine

## 2012-03-07 ENCOUNTER — Encounter: Payer: Self-pay | Admitting: Internal Medicine

## 2012-03-07 VITALS — BP 130/80 | HR 86 | Temp 97.4°F | Resp 16 | Wt 140.0 lb

## 2012-03-07 DIAGNOSIS — IMO0001 Reserved for inherently not codable concepts without codable children: Secondary | ICD-10-CM

## 2012-03-07 MED ORDER — SITAGLIP PHOS-METFORMIN HCL ER 50-1000 MG PO TB24: 1.0000 | ORAL_TABLET | Freq: Every day | ORAL | Status: DC

## 2012-03-07 NOTE — Progress Notes (Signed)
Subjective:    Jill Chandler is a 67 y.o. female who presents for an initial evaluation of Type 2 diabetes mellitus.  Current symptoms/problems include none and have been worsening. Symptoms have been present for 1 month.  The patient was initially diagnosed with Type 2 diabetes mellitus based on the following criteria:  today.  Known diabetic complications: nephropathy Cardiovascular risk factors: none Current diabetic medications include none.   Eye exam current (within one year): no Weight trend: increasing steadily Prior visit with dietician: no Current diet: in general, an "unhealthy" diet Current exercise: none  Current monitoring regimen: none Home blood sugar records: n/a Any episodes of hypoglycemia? no  Is She on ACE inhibitor or angiotensin II receptor blocker?  No   none  The following portions of the patient's history were reviewed and updated as appropriate: allergies, current medications, past family history, past medical history, past social history, past surgical history and problem list.  Review of Systems Pertinent items are noted in HPI.    Objective:    BP 130/80  Pulse 86  Temp(Src) 97.4 F (36.3 C) (Oral)  Resp 16  Wt 140 lb (63.504 kg)  BMI 25.6 kg/m2  SpO2 97%  General:  alert, cooperative and appears stated age  Oropharynx: lips, mucosa, and tongue normal; teeth and gums normal   Eyes:  conjunctivae/corneas clear. PERRL, EOM's intact. Fundi benign.   Ears:  normal TM's and external ear canals both ears  Neck: no adenopathy, no carotid bruit, no JVD, supple, symmetrical, trachea midline and thyroid not enlarged, symmetric, no tenderness/mass/nodules  Thyroid:  no palpable nodule  Lung: clear to auscultation bilaterally  Heart:  regular rate and rhythm, S1, S2 normal, no murmur, click, rub or gallop  Abdomen: soft, non-tender; bowel sounds normal; no masses,  no organomegaly  Extremities: extremities normal, atraumatic, no cyanosis or edema   Skin: warm and dry, no hyperpigmentation, vitiligo, or suspicious lesions  Pulses: 2+ and symmetric  Neuro: normal without focal findings, mental status, speech normal, alert and oriented x3, PERLA and reflexes normal and symmetric   Lab Results  Component Value Date   WBC 6.7 02/29/2012   HGB 14.0 02/29/2012   HCT 41.8 02/29/2012   PLT 225.0 02/29/2012   GLUCOSE 189* 02/29/2012   CHOL 187 02/29/2012   TRIG 301.0* 02/29/2012   HDL 43.90 02/29/2012   LDLDIRECT 93.3 02/29/2012   ALT 38* 02/29/2012   AST 30 02/29/2012   NA 138 02/29/2012   K 4.1 02/29/2012   CL 102 02/29/2012   CREATININE 0.8 02/29/2012   BUN 15 02/29/2012   CO2 27 02/29/2012   TSH 1.66 02/29/2012   HGBA1C 8.6* 02/29/2012   MICROALBUR 0.3 02/29/2012   Lab Review Glucose, Bld (mg/dL)  Date Value  08/26/1912 189*  04/10/2011 111*  03/12/2011 138*     CO2 (mEq/L)  Date Value  02/29/2012 27   04/10/2011 29   03/12/2011 29      BUN (mg/dL)  Date Value  08/03/2954 15   04/10/2011 10   03/12/2011 15      Creatinine, Ser (mg/dL)  Date Value  02/27/3084 0.8   04/10/2011 0.8   03/12/2011 0.8    none    Assessment:    Diabetes Mellitus type II, under poor control.    Plan:    1.  Rx changes: start janumet-xr 2.  Education: Reviewed 'ABCs' of diabetes management (respective goals in parentheses):  A1C (<7), blood pressure (<130/80), and cholesterol (LDL <100). 3.  Compliance at present is estimated to be fair. Efforts to improve compliance (if necessary) will be directed at dietary modifications: reduce carbs, increased exercise and regular blood sugar monitoring: daily. 4. Follow up: 4 months

## 2012-03-07 NOTE — Patient Instructions (Signed)

## 2012-03-12 ENCOUNTER — Ambulatory Visit: Payer: BC Managed Care – PPO | Admitting: *Deleted

## 2012-03-23 ENCOUNTER — Encounter: Payer: Self-pay | Admitting: Internal Medicine

## 2012-03-28 ENCOUNTER — Ambulatory Visit: Payer: BC Managed Care – PPO | Admitting: *Deleted

## 2012-04-15 ENCOUNTER — Other Ambulatory Visit: Payer: Self-pay | Admitting: Internal Medicine

## 2012-04-15 ENCOUNTER — Encounter: Payer: Self-pay | Admitting: Internal Medicine

## 2012-04-15 DIAGNOSIS — IMO0001 Reserved for inherently not codable concepts without codable children: Secondary | ICD-10-CM

## 2012-04-15 MED ORDER — GLUCOSE BLOOD VI STRP: ORAL_STRIP | Status: DC

## 2012-04-15 MED ORDER — ONETOUCH VERIO SYNC SYSTEM W/DEVICE KIT: 1.0000 | PACK | Freq: Two times a day (BID) | Status: DC

## 2012-06-02 ENCOUNTER — Other Ambulatory Visit: Payer: Self-pay

## 2012-06-02 DIAGNOSIS — IMO0001 Reserved for inherently not codable concepts without codable children: Secondary | ICD-10-CM

## 2012-06-02 DIAGNOSIS — E785 Hyperlipidemia, unspecified: Secondary | ICD-10-CM

## 2012-06-02 MED ORDER — SIMVASTATIN 40 MG PO TABS: 40.0000 mg | ORAL_TABLET | Freq: Every day | ORAL | Status: DC

## 2012-06-09 ENCOUNTER — Encounter: Payer: Self-pay | Admitting: Internal Medicine

## 2012-06-09 ENCOUNTER — Other Ambulatory Visit (INDEPENDENT_AMBULATORY_CARE_PROVIDER_SITE_OTHER): Payer: BC Managed Care – PPO

## 2012-06-09 ENCOUNTER — Ambulatory Visit (INDEPENDENT_AMBULATORY_CARE_PROVIDER_SITE_OTHER): Payer: BC Managed Care – PPO | Admitting: Internal Medicine

## 2012-06-09 VITALS — BP 124/76 | HR 80 | Temp 98.1°F | Resp 16 | Wt 132.0 lb

## 2012-06-09 DIAGNOSIS — R9431 Abnormal electrocardiogram [ECG] [EKG]: Secondary | ICD-10-CM

## 2012-06-09 DIAGNOSIS — IMO0001 Reserved for inherently not codable concepts without codable children: Secondary | ICD-10-CM

## 2012-06-09 DIAGNOSIS — E785 Hyperlipidemia, unspecified: Secondary | ICD-10-CM

## 2012-06-09 LAB — COMPREHENSIVE METABOLIC PANEL
ALT: 20 U/L (ref 0–35)
Albumin: 3.9 g/dL (ref 3.5–5.2)
CO2: 25 mEq/L (ref 19–32)
GFR: 83.78 mL/min (ref >60.00)
Glucose, Bld: 104 mg/dL — ABNORMAL HIGH (ref 70–99)
Potassium: 4.3 mEq/L (ref 3.5–5.1)
Sodium: 135 mEq/L (ref 135–145)
Total Protein: 6.8 g/dL (ref 6.0–8.3)

## 2012-06-09 LAB — LIPID PANEL: Total CHOL/HDL Ratio: 3

## 2012-06-09 LAB — HEMOGLOBIN A1C: Hgb A1c MFr Bld: 6.4 % (ref 4.6–6.5)

## 2012-06-09 NOTE — Progress Notes (Signed)
Subjective:    Patient ID: Jill Chandler, female    DOB: April 21, 1945, 67 y.o.   MRN: [id number]  Diabetes She presents for her follow-up diabetic visit. She has type 2 diabetes mellitus. Her disease course has been stable. There are no hypoglycemic associated symptoms. Pertinent negatives for hypoglycemia include no dizziness. Associated symptoms include fatigue. Pertinent negatives for diabetes include no blurred vision, no chest pain, no foot paresthesias, no foot ulcerations, no polydipsia, no polyphagia, no polyuria, no visual change, no weakness and no weight loss. There are no hypoglycemic complications. There are no diabetic complications. Current diabetic treatment includes oral agent (dual therapy). She is compliant with treatment all of the time. Her weight is decreasing steadily. She is following a generally healthy diet. Meal planning includes avoidance of concentrated sweets. She participates in exercise intermittently. There is no change in her home blood glucose trend. Her breakfast blood glucose range is generally 90-110 mg/dl. Her lunch blood glucose range is generally 110-130 mg/dl. Her dinner blood glucose range is generally 130-140 mg/dl. Her highest blood glucose is 130-140 mg/dl. Her overall blood glucose range is 110-130 mg/dl. An ACE inhibitor/angiotensin II receptor blocker is not being taken. She does not see a podiatrist.Eye exam is current.      Review of Systems  Constitutional: Positive for fatigue. Negative for fever, chills, weight loss, diaphoresis, activity change, appetite change and unexpected weight change.  HENT: Negative.   Eyes: Negative.  Negative for blurred vision.  Respiratory: Negative.  Negative for cough, chest tightness, shortness of breath, wheezing and stridor.   Cardiovascular: Negative.  Negative for chest pain, palpitations and leg swelling.  Gastrointestinal: Negative.  Negative for nausea, vomiting, abdominal pain and constipation.  Endocrine:  Negative.  Negative for polydipsia, polyphagia and polyuria.  Genitourinary: Negative.   Musculoskeletal: Negative.  Negative for myalgias, back pain, joint swelling and gait problem.  Skin: Negative.   Allergic/Immunologic: Negative.   Neurological: Negative.  Negative for dizziness, syncope, weakness and light-headedness.  Hematological: Negative.   Psychiatric/Behavioral: Negative.        Objective:   Physical Exam  Vitals reviewed. Constitutional: She is oriented to person, place, and time. She appears well-developed and well-nourished. No distress.  HENT:  Head: Normocephalic and atraumatic.  Mouth/Throat: Oropharynx is clear and moist. No oropharyngeal exudate.  Eyes: Conjunctivae are normal. Right eye exhibits no discharge. Left eye exhibits no discharge. No scleral icterus.  Neck: Normal range of motion. Neck supple. No JVD present. No tracheal deviation present. No thyromegaly present.  Cardiovascular: Normal rate, regular rhythm, normal heart sounds and intact distal pulses.  Exam reveals no gallop and no friction rub.   No murmur heard. Pulmonary/Chest: Effort normal and breath sounds normal. No stridor. No respiratory distress. She has no wheezes. She has no rales. She exhibits no tenderness.  Abdominal: Soft. Bowel sounds are normal. She exhibits no distension and no mass. There is no tenderness. There is no rebound and no guarding.  Musculoskeletal: Normal range of motion. She exhibits no edema and no tenderness.  Lymphadenopathy:    She has no cervical adenopathy.  Neurological: She is oriented to person, place, and time.  Skin: Skin is warm and dry. No rash noted. She is not diaphoretic. No erythema. No pallor.  Psychiatric: She has a normal mood and affect. Her behavior is normal. Judgment and thought content normal.      Lab Results  Component Value Date   WBC 6.7 02/29/2012   HGB 14.0 02/29/2012  HCT 41.8 02/29/2012   PLT 225.0 02/29/2012   GLUCOSE 189* 02/29/2012    CHOL 187 02/29/2012   TRIG 301.0* 02/29/2012   HDL 43.90 02/29/2012   LDLDIRECT 93.3 02/29/2012   ALT 38* 02/29/2012   AST 30 02/29/2012   NA 138 02/29/2012   K 4.1 02/29/2012   CL 102 02/29/2012   CREATININE 0.8 02/29/2012   BUN 15 02/29/2012   CO2 27 02/29/2012   TSH 1.66 02/29/2012   HGBA1C 8.6* 02/29/2012   MICROALBUR 0.3 02/29/2012      Assessment & Plan:

## 2012-06-09 NOTE — Assessment & Plan Note (Signed)
I will check her A1C and will monitor her renal function 

## 2012-06-09 NOTE — Assessment & Plan Note (Signed)
Her pr interval is just below the normal range but otherwise her EKG appears stable

## 2012-06-09 NOTE — Assessment & Plan Note (Signed)
She is doing well on zocor 

## 2012-06-09 NOTE — Patient Instructions (Signed)

## 2012-06-20 ENCOUNTER — Other Ambulatory Visit: Payer: Self-pay | Admitting: Internal Medicine

## 2012-06-20 ENCOUNTER — Encounter: Payer: Self-pay | Admitting: Internal Medicine

## 2012-06-20 DIAGNOSIS — IMO0001 Reserved for inherently not codable concepts without codable children: Secondary | ICD-10-CM

## 2012-06-21 ENCOUNTER — Encounter: Payer: Self-pay | Admitting: Internal Medicine

## 2012-06-21 DIAGNOSIS — IMO0001 Reserved for inherently not codable concepts without codable children: Secondary | ICD-10-CM

## 2012-06-21 MED ORDER — ONETOUCH VERIO SYNC SYSTEM W/DEVICE KIT: 1.0000 | PACK | Freq: Two times a day (BID) | Status: DC

## 2012-06-21 MED ORDER — SITAGLIP PHOS-METFORMIN HCL ER 50-1000 MG PO TB24: 1.0000 | ORAL_TABLET | Freq: Every day | ORAL | Status: DC

## 2012-06-22 ENCOUNTER — Encounter: Payer: Self-pay | Admitting: Internal Medicine

## 2012-06-22 MED ORDER — GLUCOSE BLOOD VI STRP: ORAL_STRIP | Status: DC

## 2012-07-12 ENCOUNTER — Ambulatory Visit (INDEPENDENT_AMBULATORY_CARE_PROVIDER_SITE_OTHER): Payer: BC Managed Care – PPO | Admitting: Internal Medicine

## 2012-07-12 ENCOUNTER — Encounter: Payer: Self-pay | Admitting: Internal Medicine

## 2012-07-12 VITALS — BP 128/84 | HR 105 | Temp 97.7°F

## 2012-07-12 DIAGNOSIS — L501 Idiopathic urticaria: Secondary | ICD-10-CM

## 2012-07-12 MED ORDER — METHYLPREDNISOLONE ACETATE 80 MG/ML IJ SUSP
80.0000 mg | Freq: Once | INTRAMUSCULAR | Status: AC
  Administered 2012-07-12: 80 mg via INTRAMUSCULAR

## 2012-07-12 MED ORDER — DIPHENHYDRAMINE HCL 50 MG/ML IJ SOLN
25.0000 mg | Freq: Once | INTRAMUSCULAR | Status: AC
  Administered 2012-07-12: 25 mg via INTRAMUSCULAR

## 2012-07-12 NOTE — Progress Notes (Signed)
Subjective:    Patient ID: Jill Chandler, female    DOB: [birth year], 67 y.o.   MRN: [id number]  HPI  Pt presents to the clinic today with c/o hives. This started last night. She was not exposed to anything that she is aware of. She did not eat anything out of the ordinary. She has not started any new medications. She has not used any new detergents or soaps. It is very itchy. She has been taking benadryl without relief. She has never had any hives in the past. She denies difficulty breathing.  Review of Systems      Past Medical History  Diagnosis Date  . Hyperlipidemia   . GERD (gastroesophageal reflux disease)   . Depression   . Anemia   . ADHD (attention deficit hyperactivity disorder)   . Headache(784.0)   . Hx of colonic polyps     Current Outpatient Prescriptions  Medication Sig Dispense Refill  . amphetamine-dextroamphetamine (ADDERALL) 20 MG tablet Take 20 mg by mouth 2 (two) times daily.        . Blood Glucose Monitoring Suppl (ONETOUCH VERIO [facility] SYSTEM) W/DEVICE KIT 1 Act by Does not apply route 2 (two) times daily.  1 kit  1  . Calcium Carbonate-Vitamin D (CALCIUM + D PO) Take by mouth.        . Cholecalciferol (VITAMIN D PO) Take by mouth.        . etodolac (LODINE) 500 MG tablet Take 500 mg by mouth as needed.        . fish oil-omega-3 fatty acids 1000 MG capsule Take 2 g by mouth daily.        Marland Kitchen FLUoxetine (PROZAC) 40 MG capsule Take 40 mg by mouth 2 (two) times daily.       Marland Kitchen glucose blood (ONETOUCH VERIO) test strip Test twice daily  200 each  4  . METHOCARBAMOL PO Take by mouth as needed.      . simvastatin (ZOCOR) 40 MG tablet Take 1 tablet (40 mg total) by mouth at bedtime.  90 tablet  3  . SitaGLIPtin-MetFORMIN HCl (JANUMET XR) 50-1000 MG TB24 Take 1 tablet by mouth daily.  90 tablet  2  . traZODone (DESYREL) 100 MG tablet Take 150 mg by mouth.        No current facility-administered medications for this visit.    No Known Allergies  Family History   Problem Relation Age of Onset  . Alcohol abuse Other   . Drug abuse Other   . Hypertension Other   . Hyperlipidemia Other   . Cancer Neg Hx   . Diabetes Neg Hx   . Early death Neg Hx   . Hearing loss Neg Hx   . Heart disease Neg Hx   . Kidney disease Neg Hx   . Learning disabilities Neg Hx   . Stroke Neg Hx     History   Social History  . Marital Status: Single    Spouse Name: N/A    Number of Children: N/A  . Years of Education: N/A   Occupational History  . Not on file.   Social History Main Topics  . Smoking status: Never Smoker   . Smokeless tobacco: Never Used  . Alcohol Use: No  . Drug Use: No  . Sexually Active: Not Currently    Birth Control/ Protection: Post-menopausal   Other Topics Concern  . Not on file   Social History Narrative  . No narrative on file  Constitutional: Denies fever, malaise, fatigue, headache or abrupt weight changes. Marland Kitchen Respiratory: Denies difficulty breathing, shortness of breath, cough or sputum production.   Cardiovascular: Denies chest pain, chest tightness, palpitations or swelling in the hands or feet.   Skin: Pt reports hives. Denies redness, lesions or ulcercations.    No other specific complaints in a complete review of systems (except as listed in HPI above).  Objective:   Physical Exam  BP 128/84  Pulse 105  Temp(Src) 97.7 F (36.5 C) (Oral)  SpO2 95% Wt Readings from Last 3 Encounters:  06/09/12 132 lb (59.875 kg)  03/07/12 140 lb (63.504 kg)  02/29/12 147 lb (66.679 kg)    General: Appears her stated age, well developed, well nourished in NAD. Skin: Warm, dry and intact. No rashes, lesions or ulcerations noted. Generalized hives noted.  Cardiovascular: Normal rate and rhythm. S1,S2 noted.  No murmur, rubs or gallops noted. No JVD or BLE edema. No carotid bruits noted. Pulmonary/Chest: Normal effort and positive vesicular breath sounds. No respiratory distress. No wheezes, rales or ronchi noted.     BMET    Component Value Date/Time   NA 135 06/09/2012 1037   K 4.3 06/09/2012 1037   CL 101 06/09/2012 1037   CO2 25 06/09/2012 1037   GLUCOSE 104* 06/09/2012 1037   BUN 12 06/09/2012 1037   CREATININE 0.7 06/09/2012 1037   CALCIUM 9.7 06/09/2012 1037   GFRNONAA 83.20 10/10/2009 1007    Lipid Panel     Component Value Date/Time   CHOL 130 06/09/2012 1037   TRIG 209.0* 06/09/2012 1037   HDL 42.70 06/09/2012 1037   CHOLHDL 3 06/09/2012 1037   VLDL 41.8* 06/09/2012 1037    CBC    Component Value Date/Time   WBC 6.7 02/29/2012 1331   RBC 5.18* 02/29/2012 1331   HGB 14.0 02/29/2012 1331   HCT 41.8 02/29/2012 1331   PLT 225.0 02/29/2012 1331   MCV 80.7 02/29/2012 1331   MCHC 33.5 02/29/2012 1331   RDW 14.0 02/29/2012 1331   LYMPHSABS 1.6 02/29/2012 1331   MONOABS 0.7 02/29/2012 1331   EOSABS 0.1 02/29/2012 1331   BASOSABS 0.0 02/29/2012 1331    Hgb A1C Lab Results  Component Value Date   HGBA1C 6.4 06/09/2012         Assessment & Plan:   Hives, unknown origin, new onset:  80 mg Depo IM today 25 mg Benadryl IM today  Continue benadryl as needed

## 2012-07-12 NOTE — Patient Instructions (Signed)

## 2012-07-12 NOTE — Addendum Note (Signed)
Addended by: Carin Primrose on: 07/12/2012 04:18 PM   Modules accepted: Orders

## 2012-09-04 ENCOUNTER — Encounter: Payer: Self-pay | Admitting: Internal Medicine

## 2012-09-04 DIAGNOSIS — E785 Hyperlipidemia, unspecified: Secondary | ICD-10-CM

## 2012-09-04 DIAGNOSIS — IMO0001 Reserved for inherently not codable concepts without codable children: Secondary | ICD-10-CM

## 2012-09-05 MED ORDER — SIMVASTATIN 40 MG PO TABS: 40.0000 mg | ORAL_TABLET | Freq: Every day | ORAL | Status: DC

## 2012-10-31 ENCOUNTER — Other Ambulatory Visit (INDEPENDENT_AMBULATORY_CARE_PROVIDER_SITE_OTHER): Payer: Medicare Other

## 2012-10-31 ENCOUNTER — Encounter: Payer: Self-pay | Admitting: Internal Medicine

## 2012-10-31 ENCOUNTER — Ambulatory Visit (INDEPENDENT_AMBULATORY_CARE_PROVIDER_SITE_OTHER): Payer: Medicare Other | Admitting: Internal Medicine

## 2012-10-31 VITALS — BP 132/82 | HR 93 | Temp 96.8°F | Resp 16 | Ht 62.0 in | Wt 124.0 lb

## 2012-10-31 DIAGNOSIS — F909 Attention-deficit hyperactivity disorder, unspecified type: Secondary | ICD-10-CM | POA: Insufficient documentation

## 2012-10-31 DIAGNOSIS — Z23 Encounter for immunization: Secondary | ICD-10-CM

## 2012-10-31 DIAGNOSIS — F329 Major depressive disorder, single episode, unspecified: Secondary | ICD-10-CM | POA: Insufficient documentation

## 2012-10-31 DIAGNOSIS — K219 Gastro-esophageal reflux disease without esophagitis: Secondary | ICD-10-CM

## 2012-10-31 DIAGNOSIS — E785 Hyperlipidemia, unspecified: Secondary | ICD-10-CM

## 2012-10-31 DIAGNOSIS — IMO0001 Reserved for inherently not codable concepts without codable children: Secondary | ICD-10-CM

## 2012-10-31 DIAGNOSIS — F988 Other specified behavioral and emotional disorders with onset usually occurring in childhood and adolescence: Secondary | ICD-10-CM

## 2012-10-31 DIAGNOSIS — F32A Depression, unspecified: Secondary | ICD-10-CM

## 2012-10-31 HISTORY — DX: Major depressive disorder, single episode, unspecified: F32.9

## 2012-10-31 LAB — BASIC METABOLIC PANEL
BUN: 11 mg/dL (ref 6–23)
CO2: 30 mEq/L (ref 19–32)
Chloride: 99 mEq/L (ref 96–112)
GFR: 93.85 mL/min (ref >60.00)
Glucose, Bld: 138 mg/dL — ABNORMAL HIGH (ref 70–99)
Potassium: 3.8 mEq/L (ref 3.5–5.1)
Sodium: 135 mEq/L (ref 135–145)

## 2012-10-31 MED ORDER — OMEPRAZOLE 20 MG PO CPDR: 20.0000 mg | DELAYED_RELEASE_CAPSULE | Freq: Every day | ORAL | Status: DC

## 2012-10-31 NOTE — Patient Instructions (Signed)
Gastroesophageal Reflux Disease, Adult  Gastroesophageal reflux disease (GERD) happens when acid from your stomach flows up into the esophagus. When acid comes in contact with the esophagus, the acid causes soreness (inflammation) in the esophagus. Over time, GERD may create small holes (ulcers) in the lining of the esophagus.  CAUSES   · Increased body weight. This puts pressure on the stomach, making acid rise from the stomach into the esophagus.  · Smoking. This increases acid production in the stomach.  · Drinking alcohol. This causes decreased pressure in the lower esophageal sphincter (valve or ring of muscle between the esophagus and stomach), allowing acid from the stomach into the esophagus.  · Late evening meals and a full stomach. This increases pressure and acid production in the stomach.  · A malformed lower esophageal sphincter.  Sometimes, no cause is found.  SYMPTOMS   · Burning pain in the lower part of the mid-chest behind the breastbone and in the mid-stomach area. This may occur twice a week or more often.  · Trouble swallowing.  · Sore throat.  · Dry cough.  · Asthma-like symptoms including chest tightness, shortness of breath, or wheezing.  DIAGNOSIS   Your caregiver may be able to diagnose GERD based on your symptoms. In some cases, X-rays and other tests may be done to check for complications or to check the condition of your stomach and esophagus.  TREATMENT   Your caregiver may recommend over-the-counter or prescription medicines to help decrease acid production. Ask your caregiver before starting or adding any new medicines.   HOME CARE INSTRUCTIONS   · Change the factors that you can control. Ask your caregiver for guidance concerning weight loss, quitting smoking, and alcohol consumption.  · Avoid foods and drinks that make your symptoms worse, such as:  · Caffeine or alcoholic drinks.  · Chocolate.  · Peppermint or mint flavorings.  · Garlic and onions.  · Spicy foods.  · Citrus fruits,  such as oranges, lemons, or limes.  · Tomato-based foods such as sauce, chili, salsa, and pizza.  · Fried and fatty foods.  · Avoid lying down for the 3 hours prior to your bedtime or prior to taking a nap.  · Eat small, frequent meals instead of large meals.  · Wear loose-fitting clothing. Do not wear anything tight around your waist that causes pressure on your stomach.  · Raise the head of your bed 6 to 8 inches with wood blocks to help you sleep. Extra pillows will not help.  · Only take over-the-counter or prescription medicines for pain, discomfort, or fever as directed by your caregiver.  · Do not take aspirin, ibuprofen, or other nonsteroidal anti-inflammatory drugs (NSAIDs).  SEEK IMMEDIATE MEDICAL CARE IF:   · You have pain in your arms, neck, jaw, teeth, or back.  · Your pain increases or changes in intensity or duration.  · You develop nausea, vomiting, or sweating (diaphoresis).  · You develop shortness of breath, or you faint.  · Your vomit is green, yellow, black, or looks like coffee grounds or blood.  · Your stool is red, bloody, or black.  These symptoms could be signs of other problems, such as heart disease, gastric bleeding, or esophageal bleeding.  MAKE SURE YOU:   · Understand these instructions.  · Will watch your condition.  · Will get help right away if you are not doing well or get worse.  Document Released: 10/22/2004 Document Revised: 04/06/2011 Document Reviewed: 08/01/2010  [employer]® Patient   Information ©2014 [employer].

## 2012-10-31 NOTE — Assessment & Plan Note (Signed)
She has some atypical s/s so I have asked her to see GI to consider getting an upper endoscopy done For now, she will cont prilosec

## 2012-10-31 NOTE — Progress Notes (Signed)
Subjective:    Patient ID: Jill Chandler, female    DOB: 24-Jan-1946, 67 y.o.   MRN: [id number]  Gastrophageal Reflux She complains of belching, early satiety and heartburn. She reports no abdominal pain, no chest pain, no choking, no coughing, no dysphagia, no globus sensation, no hoarse voice, no nausea, no sore throat, no stridor, no tooth decay, no water brash or no wheezing. This is a chronic problem. The current episode started more than 1 year ago. The problem has been gradually worsening. The heartburn does not wake her from sleep. The heartburn does not limit her activity. The heartburn doesn't change with position. Nothing aggravates the symptoms. Associated symptoms include weight loss. Pertinent negatives include no anemia, fatigue, melena, muscle weakness or orthopnea. Risk factors include NSAIDs. She has tried a PPI for the symptoms. The treatment provided moderate relief. Past procedures do not include an abdominal ultrasound, an EGD, esophageal manometry, esophageal pH monitoring, H. pylori antibody titer or a UGI.      Review of Systems  Constitutional: Positive for weight loss and unexpected weight change. Negative for fever, chills, diaphoresis, appetite change and fatigue.  HENT: Negative.  Negative for sore throat and hoarse voice.   Eyes: Negative.   Respiratory: Negative.  Negative for cough, choking, shortness of breath, wheezing and stridor.   Cardiovascular: Negative.  Negative for chest pain, palpitations and leg swelling.  Gastrointestinal: Positive for heartburn. Negative for dysphagia, nausea, vomiting, abdominal pain, diarrhea, constipation, blood in stool, melena, abdominal distention, anal bleeding and rectal pain.  Endocrine: Negative.  Negative for polydipsia, polyphagia and polyuria.  Genitourinary: Negative.   Musculoskeletal: Negative.  Negative for myalgias, arthralgias and muscle weakness.  Skin: Negative.   Allergic/Immunologic: Negative.   Neurological:  Negative.   Hematological: Negative.  Negative for adenopathy. Does not bruise/bleed easily.  Psychiatric/Behavioral: Negative.        Objective:   Physical Exam  Vitals reviewed. Constitutional: She is oriented to person, place, and time. She appears well-developed and well-nourished. No distress.  HENT:  Head: Normocephalic and atraumatic.  Mouth/Throat: Oropharynx is clear and moist.  Eyes: Conjunctivae are normal. Right eye exhibits no discharge. Left eye exhibits no discharge. No scleral icterus.  Neck: Normal range of motion. Neck supple. No JVD present. No tracheal deviation present. No thyromegaly present.  Cardiovascular: Normal rate, regular rhythm, normal heart sounds and intact distal pulses.  Exam reveals no gallop and no friction rub.   No murmur heard. Pulmonary/Chest: Effort normal and breath sounds normal. No stridor. No respiratory distress. She has no wheezes. She has no rales. She exhibits no tenderness.  Abdominal: Soft. Bowel sounds are normal. She exhibits no distension and no mass. There is no tenderness. There is no rebound and no guarding.  Musculoskeletal: Normal range of motion. She exhibits no edema and no tenderness.  Lymphadenopathy:    She has no cervical adenopathy.  Neurological: She is oriented to person, place, and time.  Skin: Skin is warm and dry. No rash noted. She is not diaphoretic. No erythema. No pallor.  Psychiatric: She has a normal mood and affect. Her behavior is normal. Judgment and thought content normal.     Lab Results  Component Value Date   WBC 6.7 02/29/2012   HGB 14.0 02/29/2012   HCT 41.8 02/29/2012   PLT 225.0 02/29/2012   GLUCOSE 104* 06/09/2012   CHOL 130 06/09/2012   TRIG 209.0* 06/09/2012   HDL 42.70 06/09/2012   LDLDIRECT 54.1 06/09/2012   ALT 20  06/09/2012   AST 22 06/09/2012   NA 135 06/09/2012   K 4.3 06/09/2012   CL 101 06/09/2012   CREATININE 0.7 06/09/2012   BUN 12 06/09/2012   CO2 25 06/09/2012   TSH 1.66 02/29/2012    HGBA1C 6.4 06/09/2012   MICROALBUR 0.3 02/29/2012       Assessment & Plan:

## 2012-10-31 NOTE — Assessment & Plan Note (Signed)
She is doing well on zocor 

## 2012-10-31 NOTE — Assessment & Plan Note (Addendum)
I will check her A1C and will monitor her renal function  Late note, her A1C is down to 6.3 so I have asked her to stop taking janumet

## 2012-11-01 ENCOUNTER — Encounter: Payer: Self-pay | Admitting: Internal Medicine

## 2012-11-01 NOTE — Addendum Note (Signed)
Addended by: Etta Grandchild on: 11/01/2012 07:52 AM   Modules accepted: Orders, Medications

## 2012-11-02 ENCOUNTER — Encounter: Payer: Self-pay | Admitting: Internal Medicine

## 2012-11-03 ENCOUNTER — Encounter: Payer: Self-pay | Admitting: Internal Medicine

## 2012-11-03 DIAGNOSIS — IMO0001 Reserved for inherently not codable concepts without codable children: Secondary | ICD-10-CM

## 2012-11-03 MED ORDER — GLUCOSE BLOOD VI STRP: ORAL_STRIP | Status: DC

## 2012-11-22 ENCOUNTER — Ambulatory Visit: Payer: Medicare Other | Admitting: Internal Medicine

## 2012-11-30 ENCOUNTER — Encounter: Payer: Self-pay | Admitting: Internal Medicine

## 2012-11-30 ENCOUNTER — Other Ambulatory Visit: Payer: Self-pay | Admitting: Internal Medicine

## 2012-11-30 DIAGNOSIS — M810 Age-related osteoporosis without current pathological fracture: Secondary | ICD-10-CM | POA: Insufficient documentation

## 2012-11-30 HISTORY — DX: Age-related osteoporosis without current pathological fracture: M81.0

## 2012-12-15 ENCOUNTER — Encounter: Payer: Self-pay | Admitting: Internal Medicine

## 2013-01-06 ENCOUNTER — Ambulatory Visit (INDEPENDENT_AMBULATORY_CARE_PROVIDER_SITE_OTHER): Payer: Medicare Other | Admitting: Internal Medicine

## 2013-01-06 ENCOUNTER — Encounter: Payer: Self-pay | Admitting: Internal Medicine

## 2013-01-06 VITALS — BP 112/70 | HR 102 | Ht 62.0 in | Wt 122.4 lb

## 2013-01-06 DIAGNOSIS — K219 Gastro-esophageal reflux disease without esophagitis: Secondary | ICD-10-CM

## 2013-01-06 DIAGNOSIS — Z86010 Personal history of colonic polyps: Secondary | ICD-10-CM

## 2013-01-06 DIAGNOSIS — R634 Abnormal weight loss: Secondary | ICD-10-CM | POA: Insufficient documentation

## 2013-01-06 MED ORDER — SOD PICOSULFATE-MAG OX-CIT ACD 10-3.5-12 MG-GM-GM PO PACK: 1.0000 | PACK | Freq: Once | ORAL | Status: DC

## 2013-01-06 MED ORDER — OMEPRAZOLE 20 MG PO CPDR: 20.0000 mg | DELAYED_RELEASE_CAPSULE | Freq: Every day | ORAL | Status: DC | PRN

## 2013-01-06 NOTE — Patient Instructions (Signed)
You have been scheduled for an endoscopy and colonoscopy with propofol. Please follow the written instructions given to you at your visit today. Please pick up your prep at the pharmacy within the next 1-3 days. If you use inhalers (even only as needed), please bring them with you on the day of your procedure. Your physician has requested that you go to www.startemmi.com and enter the access code given to you at your visit today. This web site gives a general overview about your procedure. However, you should still follow specific instructions given to you by our office regarding your preparation for the procedure.  I appreciate the opportunity to care for you.  

## 2013-01-06 NOTE — Progress Notes (Signed)
Subjective:    Patient ID: Jill Chandler, female    DOB: August 09, 1945, 67 y.o.   MRN: [id number]  HPI The patient is a very pleasant 67 year old divorced white woman who works as a Print production planner and addiction therapist. She was started on Janumet earlier this year and after that starting having epigastric discomfort anorexia problems and a lot of belching and burping and reflux symptoms. She was started on Prilosec which seemed to help. She says she lost 25 pounds, partially by intent it seems and her hemoglobin A1c normalized this issue is able to stop Janumet. Since then she is much better.  Other pertinent history is that of a colonoscopy in 2008 by Dr. Loreta Ave, with 2 polyps one of which was hyperplastic be other diminutive adenoma. No colonoscopy since. GI review of systems is otherwise negative. Wt Readings from Last 3 Encounters:  01/06/13 122 lb 6.4 oz (55.52 kg)  10/31/12 124 lb (56.247 kg)  06/09/12 132 lb (59.875 kg)   No Known Allergies Outpatient Prescriptions Prior to Visit  Medication Sig Dispense Refill  . amphetamine-dextroamphetamine (ADDERALL) 20 MG tablet Take 20 mg by mouth 2 (two) times daily.        . Calcium Carbonate-Vitamin D (CALCIUM + D PO) Take by mouth.        . Cholecalciferol (VITAMIN D PO) Take by mouth.        . etodolac (LODINE) 500 MG tablet Take 500 mg by mouth as needed.        . fish oil-omega-3 fatty acids 1000 MG capsule Take 2 g by mouth daily.        Marland Kitchen FLUoxetine (PROZAC) 40 MG capsule Take 40 mg by mouth 2 (two) times daily.       Marland Kitchen METHOCARBAMOL PO Take by mouth as needed.      . simvastatin (ZOCOR) 40 MG tablet Take 1 tablet (40 mg total) by mouth at bedtime.  90 tablet  3  . traZODone (DESYREL) 100 MG tablet Take 150 mg by mouth.       Marland Kitchen omeprazole (PRILOSEC) 20 MG capsule Take 1 capsule (20 mg total) by mouth daily.  90 capsule  3  . Blood Glucose Monitoring Suppl (ONETOUCH VERIO SYNC SYSTEM) W/DEVICE KIT 1 Act by Does not apply route 2  (two) times daily.  1 kit  1  . glucose blood (ONETOUCH VERIO) test strip Test twice daily  200 each  4   No facility-administered medications prior to visit.   Past Medical History  Diagnosis Date  . Hyperlipidemia   . GERD (gastroesophageal reflux disease)   . Depression   . Anemia   . ADHD (attention deficit hyperactivity disorder)   . Headache(784.0)   . Tubular adenoma 2008    Dr. Loreta Ave  . Personal history of colonic adenoma 06/14/2006   Past Surgical History  Procedure Laterality Date  . Cervical fusion    . Cervical laminectomy    . Colonoscopy     History   Social History  . Marital Status: Single    Spouse Name: N/A    Number of Children: N/A  . Years of Education: N/A   Social History Main Topics  . Smoking status: Never Smoker   . Smokeless tobacco: Never Used  . Alcohol Use: No  . Drug Use: No  . Sexual Activity: Not Currently    Birth Control/ Protection: Post-menopausal   The patient is divorced, she is a mental health and addiction therapist. No  children. 3 caffeinated beverages daily. Family History  Problem Relation Age of Onset  . Alcohol abuse Other   . Drug abuse Other   . Hypertension Other   . Hyperlipidemia Other   . Cancer Neg Hx   . Diabetes Neg Hx   . Early death Neg Hx   . Hearing loss Neg Hx   . Heart disease Neg Hx   . Kidney disease Neg Hx   . Learning disabilities Neg Hx   . Stroke Neg Hx        Review of Systems This is positive for those things mentioned in the HPI. All other review of systems are negative.      Objective:   Physical Exam General:  Well-developed, well-nourished and in no acute distress Eyes:  anicteric. ENT:   Mouth and posterior pharynx free of lesions.  Neck:   supple w/o thyromegaly or mass.  Lungs: Clear to auscultation bilaterally. Heart:  S1S2, no rubs, murmurs, gallops. Abdomen:  soft, non-tender, no hepatosplenomegaly, hernia, or mass and BS+.  Rectal: deferred Lymph:  no cervical or  supraclavicular adenopathy. Extremities:   no edema Skin   no rash. Neuro:  A&O x 3.  Psych:  appropriate mood and  Affect.   Data Reviewed: 2008 colonoscopy and pathology Labs PCP notes     Assessment & Plan:   1. Loss of weight   2. Personal history of colonic adenoma   3. GERD

## 2013-01-06 NOTE — Assessment & Plan Note (Signed)
6 years since last and had diminutive adenoma so have recommended colonoscopy for surveillance Wants low vol prep The risks and benefits as well as alternatives of endoscopic procedure(s) have been discussed and reviewed. All questions answered. The patient agrees to proceed.

## 2013-01-06 NOTE — Assessment & Plan Note (Signed)
Will use Prilosec prn for now

## 2013-01-06 NOTE — Assessment & Plan Note (Signed)
Probably multifactorial with hyperglycemia, diet changes but think EGD is an appropriate step. ADD meds x years so do notthink from that. Has some anorexia which could be depressive in origin. The risks and benefits as well as alternatives of endoscopic procedure(s) have been discussed and reviewed. All questions answered. The patient agrees to proceed.

## 2013-02-06 ENCOUNTER — Ambulatory Visit (INDEPENDENT_AMBULATORY_CARE_PROVIDER_SITE_OTHER): Payer: Medicare Other | Admitting: Internal Medicine

## 2013-02-06 ENCOUNTER — Other Ambulatory Visit (INDEPENDENT_AMBULATORY_CARE_PROVIDER_SITE_OTHER): Payer: Medicare Other

## 2013-02-06 ENCOUNTER — Encounter: Payer: Self-pay | Admitting: Internal Medicine

## 2013-02-06 VITALS — BP 116/70 | HR 82 | Temp 97.6°F | Resp 16 | Ht 62.0 in | Wt 126.0 lb

## 2013-02-06 DIAGNOSIS — F988 Other specified behavioral and emotional disorders with onset usually occurring in childhood and adolescence: Secondary | ICD-10-CM

## 2013-02-06 DIAGNOSIS — IMO0001 Reserved for inherently not codable concepts without codable children: Secondary | ICD-10-CM

## 2013-02-06 DIAGNOSIS — E1165 Type 2 diabetes mellitus with hyperglycemia: Principal | ICD-10-CM

## 2013-02-06 LAB — BASIC METABOLIC PANEL
BUN: 13 mg/dL (ref 6–23)
CO2: 29 mEq/L (ref 19–32)
Calcium: 9.2 mg/dL (ref 8.4–10.5)
Chloride: 103 mEq/L (ref 96–112)
Creatinine, Ser: 0.7 mg/dL (ref 0.4–1.2)
GFR: 93.77 mL/min (ref >60.00)
Glucose, Bld: 98 mg/dL (ref 70–99)
POTASSIUM: 3.7 meq/L (ref 3.5–5.1)
SODIUM: 138 meq/L (ref 135–145)

## 2013-02-06 LAB — HEMOGLOBIN A1C: Hgb A1c MFr Bld: 6.6 % — ABNORMAL HIGH (ref 4.6–6.5)

## 2013-02-06 MED ORDER — AMPHETAMINE-DEXTROAMPHETAMINE 20 MG PO TABS: 20.0000 mg | ORAL_TABLET | Freq: Two times a day (BID) | ORAL | Status: DC

## 2013-02-06 NOTE — Progress Notes (Signed)
Subjective:    Patient ID: Jill Chandler, female    DOB: [birth year], 68 y.o.   MRN: [id number]  Diabetes She presents for her follow-up diabetic visit. She has type 2 diabetes mellitus. Her disease course has been stable. There are no hypoglycemic associated symptoms. Pertinent negatives for hypoglycemia include no confusion or nervousness/anxiousness. Associated symptoms include fatigue. Pertinent negatives for diabetes include no blurred vision, no chest pain, no foot paresthesias, no foot ulcerations, no polydipsia, no polyphagia, no polyuria, no visual change, no weakness and no weight loss. There are no hypoglycemic complications. Symptoms are stable. There are no diabetic complications. Current diabetic treatment includes diet. She is compliant with treatment most of the time. Her weight is stable. She is following a generally healthy diet. Meal planning includes avoidance of concentrated sweets. She has not had a previous visit with a dietician. She participates in exercise intermittently. There is no change in her home blood glucose trend. Her breakfast blood glucose range is generally 110-130 mg/dl. Her lunch blood glucose range is generally 130-140 mg/dl. Her dinner blood glucose range is generally 140-180 mg/dl. Her highest blood glucose is 110-130 mg/dl. Her overall blood glucose range is 110-130 mg/dl. An ACE inhibitor/angiotensin II receptor blocker is not being taken. She does not see a podiatrist.Eye exam is current.      Review of Systems  Constitutional: Positive for fatigue. Negative for fever, chills, weight loss, diaphoresis, appetite change and unexpected weight change.  HENT: Negative.   Eyes: Negative.  Negative for blurred vision.  Respiratory: Negative.  Negative for cough, chest tightness, shortness of breath, wheezing and stridor.   Cardiovascular: Negative.  Negative for chest pain, palpitations and leg swelling.  Gastrointestinal: Negative.  Negative for nausea,  vomiting, abdominal pain, diarrhea and constipation.  Endocrine: Negative.  Negative for polydipsia, polyphagia and polyuria.  Genitourinary: Negative.   Musculoskeletal: Negative.   Skin: Negative.   Allergic/Immunologic: Negative.   Neurological: Negative.  Negative for weakness.  Hematological: Negative.  Negative for adenopathy. Does not bruise/bleed easily.  Psychiatric/Behavioral: Positive for decreased concentration. Negative for suicidal ideas, hallucinations, behavioral problems, confusion, sleep disturbance, self-injury, dysphoric mood and agitation. The patient is not nervous/anxious and is not hyperactive.        Objective:   Physical Exam  Vitals reviewed. Constitutional: She is oriented to person, place, and time. She appears well-developed and well-nourished. No distress.  HENT:  Head: Normocephalic and atraumatic.  Mouth/Throat: Oropharynx is clear and moist. No oropharyngeal exudate.  Eyes: Conjunctivae are normal. Right eye exhibits no discharge. Left eye exhibits no discharge. No scleral icterus.  Neck: Normal range of motion. Neck supple. No JVD present. No tracheal deviation present. No thyromegaly present.  Cardiovascular: Normal rate, regular rhythm, normal heart sounds and intact distal pulses.  Exam reveals no gallop and no friction rub.   No murmur heard. Pulmonary/Chest: Effort normal and breath sounds normal. No stridor. No respiratory distress. She has no wheezes. She has no rales. She exhibits no tenderness.  Abdominal: Soft. Bowel sounds are normal. She exhibits no distension and no mass. There is no tenderness. There is no rebound and no guarding.  Musculoskeletal: Normal range of motion. She exhibits no edema and no tenderness.  Lymphadenopathy:    She has no cervical adenopathy.  Neurological: She is oriented to person, place, and time.  Skin: Skin is warm and dry. No rash noted. She is not diaphoretic. No erythema. No pallor.  Psychiatric: She has a  normal mood and affect.  Her behavior is normal. Judgment and thought content normal.      Lab Results  Component Value Date   WBC 6.7 02/29/2012   HGB 14.0 02/29/2012   HCT 41.8 02/29/2012   PLT 225.0 02/29/2012   GLUCOSE 138* 10/31/2012   CHOL 130 06/09/2012   TRIG 209.0* 06/09/2012   HDL 42.70 06/09/2012   LDLDIRECT 54.1 06/09/2012   ALT 20 06/09/2012   AST 22 06/09/2012   NA 135 10/31/2012   K 3.8 10/31/2012   CL 99 10/31/2012   CREATININE 0.7 10/31/2012   BUN 11 10/31/2012   CO2 30 10/31/2012   TSH 1.66 02/29/2012   HGBA1C 6.3 10/31/2012   MICROALBUR 0.3 02/29/2012      Assessment & Plan:

## 2013-02-06 NOTE — Progress Notes (Signed)
Pre visit review using our clinic review tool, if applicable. No additional management support is needed unless otherwise documented below in the visit note. 

## 2013-02-07 ENCOUNTER — Encounter: Payer: Self-pay | Admitting: Internal Medicine

## 2013-02-08 ENCOUNTER — Encounter: Payer: Self-pay | Admitting: Internal Medicine

## 2013-02-08 NOTE — Assessment & Plan Note (Signed)
At her request, I refilled the adderall for her

## 2013-02-08 NOTE — Assessment & Plan Note (Signed)
Her blood sugar is well controlled She will continue with lifestyle modifications

## 2013-02-21 LAB — HM DIABETES EYE EXAM

## 2013-02-24 ENCOUNTER — Encounter: Payer: Self-pay | Admitting: Internal Medicine

## 2013-02-24 ENCOUNTER — Ambulatory Visit (AMBULATORY_SURGERY_CENTER): Payer: Medicare Other | Admitting: Internal Medicine

## 2013-02-24 VITALS — BP 121/82 | HR 77 | Temp 96.2°F | Resp 18 | Ht 62.0 in | Wt 122.0 lb

## 2013-02-24 DIAGNOSIS — Z86010 Personal history of colonic polyps: Secondary | ICD-10-CM

## 2013-02-24 DIAGNOSIS — K2289 Other specified disease of esophagus: Secondary | ICD-10-CM

## 2013-02-24 DIAGNOSIS — R634 Abnormal weight loss: Secondary | ICD-10-CM

## 2013-02-24 DIAGNOSIS — D126 Benign neoplasm of colon, unspecified: Secondary | ICD-10-CM

## 2013-02-24 DIAGNOSIS — K2270 Barrett's esophagus without dysplasia: Secondary | ICD-10-CM

## 2013-02-24 DIAGNOSIS — K449 Diaphragmatic hernia without obstruction or gangrene: Secondary | ICD-10-CM

## 2013-02-24 DIAGNOSIS — K229 Disease of esophagus, unspecified: Secondary | ICD-10-CM

## 2013-02-24 LAB — GLUCOSE, CAPILLARY
GLUCOSE-CAPILLARY: 119 mg/dL — AB (ref 70–99)
GLUCOSE-CAPILLARY: 120 mg/dL — AB (ref 70–99)

## 2013-02-24 MED ORDER — SODIUM CHLORIDE 0.9 % IV SOLN: 500.0000 mL | INTRAVENOUS | Status: DC

## 2013-02-24 NOTE — Progress Notes (Signed)
Called to room to assist during endoscopic procedure.  Patient ID and intended procedure confirmed with present staff. Received instructions for my participation in the procedure from the performing physician.  

## 2013-02-24 NOTE — Patient Instructions (Addendum)
I found a hiatal hernia (stomach slips into chest cavity from abdomen) and possible Barrett's esophagus changes on the EGD. Biopsies were taken.  Two tiny polyps removed from the colon and diverticulosis seen.  I will let you know pathology results and when to have another routine colonoscopy by mail.  I appreciate the opportunity to care for you. Gatha Mayer, MD, [facility]   Hand outs given on polyps, diverticulosis, barrett's esophagus and hiatal hernia.  YOU HAD AN ENDOSCOPIC PROCEDURE TODAY AT [facility]: Refer to the procedure report that was given to you for any specific questions about what was found during the examination.  If the procedure report does not answer your questions, please call your gastroenterologist to clarify.  If you requested that your care partner not be given the details of your procedure findings, then the procedure report has been included in a sealed envelope for you to review at your convenience later.  YOU SHOULD EXPECT: Some feelings of bloating in the abdomen. Passage of more gas than usual.  Walking can help get rid of the air that was put into your GI tract during the procedure and reduce the bloating. If you had a lower endoscopy (such as a colonoscopy or flexible sigmoidoscopy) you may notice spotting of blood in your stool or on the toilet paper. If you underwent a bowel prep for your procedure, then you may not have a normal bowel movement for a few days.  DIET: Your first meal following the procedure should be a light meal and then it is ok to progress to your normal diet.  A half-sandwich or bowl of soup is an example of a good first meal.  Heavy or fried foods are harder to digest and may make you feel nauseous or bloated.  Likewise meals heavy in dairy and vegetables can cause extra gas to form and this can also increase the bloating.  Drink plenty of fluids but you should avoid alcoholic beverages for 24 hours.  ACTIVITY: Your care  partner should take you home directly after the procedure.  You should plan to take it easy, moving slowly for the rest of the day.  You can resume normal activity the day after the procedure however you should NOT DRIVE or use heavy machinery for 24 hours (because of the sedation medicines used during the test).    SYMPTOMS TO REPORT IMMEDIATELY: A gastroenterologist can be reached at any hour.  During normal business hours, 8:30 AM to 5:00 PM Monday through Friday, call [phone].  After hours and on weekends, please call the GI answering service at [phone] who will take a message and have the physician on call contact you.   Following lower endoscopy (colonoscopy or flexible sigmoidoscopy):  Excessive amounts of blood in the stool  Significant tenderness or worsening of abdominal pains  Swelling of the abdomen that is new, acute  Fever of 100F or higher  Following upper endoscopy (EGD)  Vomiting of blood or coffee ground material  New chest pain or pain under the shoulder blades  Painful or persistently difficult swallowing  New shortness of breath  Fever of 100F or higher  Black, tarry-looking stools  FOLLOW UP: If any biopsies were taken you will be contacted by phone or by letter within the next 1-3 weeks.  Call your gastroenterologist if you have not heard about the biopsies in 3 weeks.  Our staff will call the home number listed on your records the next business  day following your procedure to check on you and address any questions or concerns that you may have at that time regarding the information given to you following your procedure. This is a courtesy call and so if there is no answer at the home number and we have not heard from you through the emergency physician on call, we will assume that you have returned to your regular daily activities without incident.  SIGNATURES/CONFIDENTIALITY: You and/or your care partner have signed paperwork which will be entered  into your electronic medical record.  These signatures attest to the fact that that the information above on your After Visit Summary has been reviewed and is understood.  Full responsibility of the confidentiality of this discharge information lies with you and/or your care-partner.

## 2013-02-24 NOTE — Op Note (Signed)
[place]  [address]. [place], [id number]   COLONOSCOPY PROCEDURE REPORT  PATIENT: Jill, Chandler  MR#: [id number] BIRTHDATE: November 16, 1945 , 83  yrs. old GENDER: Female ENDOSCOPIST: Gatha Mayer, MD, [facility] REFERRED YB:[id number] Evalina Field, M.D. PROCEDURE DATE:  02/24/2013 PROCEDURE:   Colonoscopy with biopsy First Screening Colonoscopy - Avg.  risk and is 50 yrs.  old or older - No.  Prior Negative Screening - Now for repeat screening. N/A  History of Adenoma - Now for follow-up colonoscopy & has been > or = to 3 yrs.  Yes hx of adenoma.  Has been 3 or more years since last colonoscopy.  Polyps Removed Today? Yes. ASA CLASS:   Class II INDICATIONS:Patient's personal history of adenomatous colon polyps.  MEDICATIONS: propofol (Diprivan) 100mg  IV, There was residual sedation effect present from prior procedure, MAC sedation, administered by CRNA, and These medications were titrated to patient response per physician's verbal order  DESCRIPTION OF PROCEDURE:   After the risks benefits and alternatives of the procedure were thoroughly explained, informed consent was obtained.  A digital rectal exam revealed no abnormalities of the rectum.   The [id number]  endoscope was introduced through the anus and advanced to the cecum, which was identified by both the appendix and ileocecal valve. No adverse events experienced.   The quality of the prep was [place] good The instrument was then slowly withdrawn as the colon was fully examined.   COLON FINDINGS: Two diminutive sessile polyps were found in the descending colon and rectum.  A polypectomy was performed with cold forceps.  The resection was complete and the polyp tissue was completely retrieved.   Severe diverticulosis was noted in the sigmoid colon.   The colon mucosa was otherwise normal. Retroflexed views revealed no abnormalities. The time to cecum=1 minutes 52 seconds.  Withdrawal time=8 minutes 34  seconds.  The scope was withdrawn and the procedure completed. COMPLICATIONS: There were no complications.  ENDOSCOPIC IMPRESSION: 1.   Two diminutive sessile polyps were found in the descending colon and rectum; polypectomy was performed with cold forceps 2.   Severe diverticulosis was noted in the sigmoid colon 3.   The colon mucosa was otherwise normal - good prep - hx diminutive adenoma 2008  RECOMMENDATIONS: 1.  Timing of repeat colonoscopy will be determined by pathology findings. 2.   She may not want future colonoscopy surveillance/screening - per pre-colopnoscopy discussion.  Given overall findings/hx does not appear to be high risk - await current pathology.  eSigned:  Gatha Mayer, MD, [facility] 02/24/2013 10:40 AM cc: Janith Lima, MD and The Patient

## 2013-02-24 NOTE — Op Note (Signed)
[facility]  [address]. [place], [address]   ENDOSCOPY PROCEDURE REPORT  PATIENT: Jill, Chandler  MR#: [id number] BIRTHDATE: [birth year] , 69  yrs. old GENDER: Female ENDOSCOPIST: Gatha Mayer, MD, [facility] REFERRED BY:  Janith Lima, M.D. PROCEDURE DATE:  02/24/2013 PROCEDURE:  EGD w/ biopsy ASA CLASS:     Class II INDICATIONS:  Weight loss. MEDICATIONS: propofol (Diprivan) 150mg  IV, MAC sedation, administered by CRNA, and These medications were titrated to patient response per physician's verbal order TOPICAL ANESTHETIC: Cetacaine Spray  DESCRIPTION OF PROCEDURE: After the risks benefits and alternatives of the procedure were thoroughly explained, informed consent was obtained.  The LB [id number] endoscope was introduced through the mouth and advanced to the second portion of the duodenum. Without limitations.  The instrument was slowly withdrawn as the mucosa was fully examined.    ESOPHAGUS: There was evidence of suspected Barrett's esophagus at the gastroesophageal junction. 3-4 subcentimeter columnar tongues w/o nodules or ulcers at 34-35 cm.  Multiple biopsies were performed using cold forceps.  Sample sent for histology.  STOMACH: A 5 cm hiatal hernia was noted.   35-40 cm  The remainder of the upper endoscopy exam was otherwise normal. Retroflexed views revealed no abnormalities.     The scope was then withdrawn from the patient and the procedure completed.  COMPLICATIONS: There were no complications. ENDOSCOPIC IMPRESSION: 1.   There was evidence of suspected Barrett's esophagus; multiple biopsies 2.   5 cm hiatal hernia 3.   The remainder of the upper endoscopy exam was otherwise normal  RECOMMENDATIONS: 1.  Await biopsy results 2.  Proceed with a Colonoscopy.    eSigned:  Gatha Mayer, MD, [facility] 02/24/2013 10:44 AM [id number] Evalina Field, MD and The Patient

## 2013-02-27 ENCOUNTER — Telehealth: Payer: Self-pay | Admitting: *Deleted

## 2013-02-27 NOTE — Telephone Encounter (Signed)
  Follow up Call-  Call back number 02/24/2013  Post procedure Call Back phone  # [phone]  Permission to leave phone message Yes     Patient questions:  Do you have a fever, pain , or abdominal swelling? no Pain Score  0 *  Have you tolerated food without any problems? yes  Have you been able to return to your normal activities? yes  Do you have any questions about your discharge instructions: Diet   no Medications  no Follow up visit  no  Do you have questions or concerns about your Care? no  Actions: * If pain score is 4 or above: No action needed, pain <4.

## 2013-03-01 ENCOUNTER — Encounter: Payer: Self-pay | Admitting: Internal Medicine

## 2013-03-01 DIAGNOSIS — K2270 Barrett's esophagus without dysplasia: Secondary | ICD-10-CM

## 2013-03-01 HISTORY — DX: Barrett's esophagus without dysplasia: K22.70

## 2013-03-01 LAB — HM COLONOSCOPY

## 2013-03-15 ENCOUNTER — Encounter: Payer: Self-pay | Admitting: Internal Medicine

## 2013-04-20 ENCOUNTER — Other Ambulatory Visit: Payer: Self-pay

## 2013-04-20 DIAGNOSIS — K219 Gastro-esophageal reflux disease without esophagitis: Secondary | ICD-10-CM

## 2013-04-20 MED ORDER — OMEPRAZOLE 20 MG PO CPDR: 20.0000 mg | DELAYED_RELEASE_CAPSULE | Freq: Every day | ORAL | Status: DC | PRN

## 2013-04-20 NOTE — Telephone Encounter (Signed)
Please advise if ok to refill trazodone 150 mg ( 90 day supply), I do not see where we have filled this for pt in the past.

## 2013-04-26 ENCOUNTER — Telehealth: Payer: Self-pay

## 2013-04-26 MED ORDER — TRAZODONE HCL 100 MG PO TABS: 150.0000 mg | ORAL_TABLET | Freq: Every evening | ORAL | Status: DC | PRN

## 2013-04-26 NOTE — Telephone Encounter (Signed)
yes

## 2013-04-26 NOTE — Telephone Encounter (Signed)
Received a fax from [facility] requesting Trazodone 150 mg 90 day supply. Please advise. Thanks

## 2013-04-27 ENCOUNTER — Encounter: Payer: Self-pay | Admitting: Internal Medicine

## 2013-04-27 DIAGNOSIS — K219 Gastro-esophageal reflux disease without esophagitis: Secondary | ICD-10-CM

## 2013-05-04 ENCOUNTER — Encounter: Payer: Self-pay | Admitting: Internal Medicine

## 2013-05-04 DIAGNOSIS — K219 Gastro-esophageal reflux disease without esophagitis: Secondary | ICD-10-CM

## 2013-05-04 MED ORDER — OMEPRAZOLE 20 MG PO CPDR: 20.0000 mg | DELAYED_RELEASE_CAPSULE | Freq: Every day | ORAL | Status: DC

## 2013-05-04 MED ORDER — OMEPRAZOLE 20 MG PO CPDR: 20.0000 mg | DELAYED_RELEASE_CAPSULE | Freq: Every day | ORAL | Status: DC | PRN

## 2013-05-04 NOTE — Addendum Note (Signed)
Addended by: Estell Harpin T on: 05/04/2013 09:01 AM   Modules accepted: Orders

## 2013-07-25 ENCOUNTER — Telehealth: Payer: Self-pay | Admitting: *Deleted

## 2013-07-25 ENCOUNTER — Other Ambulatory Visit: Payer: Self-pay | Admitting: Internal Medicine

## 2013-07-25 DIAGNOSIS — E1165 Type 2 diabetes mellitus with hyperglycemia: Principal | ICD-10-CM

## 2013-07-25 DIAGNOSIS — IMO0001 Reserved for inherently not codable concepts without codable children: Secondary | ICD-10-CM

## 2013-07-25 NOTE — Telephone Encounter (Signed)
Patient coming in for a lipid panel and an a1c. Orders placed.

## 2013-07-26 ENCOUNTER — Encounter: Payer: Self-pay | Admitting: Internal Medicine

## 2013-07-26 ENCOUNTER — Other Ambulatory Visit (INDEPENDENT_AMBULATORY_CARE_PROVIDER_SITE_OTHER): Payer: Medicare Other

## 2013-07-26 DIAGNOSIS — IMO0001 Reserved for inherently not codable concepts without codable children: Secondary | ICD-10-CM

## 2013-07-26 DIAGNOSIS — E1165 Type 2 diabetes mellitus with hyperglycemia: Principal | ICD-10-CM

## 2013-07-26 LAB — LIPID PANEL
CHOLESTEROL: 148 mg/dL (ref 0–200)
HDL: 49.6 mg/dL (ref >39.00)
LDL Cholesterol: 70 mg/dL (ref 0–99)
NonHDL: 98.4
TRIGLYCERIDES: 143 mg/dL (ref 0.0–149.0)
Total CHOL/HDL Ratio: 3
VLDL: 28.6 mg/dL (ref 0.0–40.0)

## 2013-07-26 LAB — HEMOGLOBIN A1C: Hgb A1c MFr Bld: 6.5 % (ref 4.6–6.5)

## 2013-08-14 ENCOUNTER — Other Ambulatory Visit: Payer: Self-pay | Admitting: Internal Medicine

## 2013-08-19 ENCOUNTER — Other Ambulatory Visit: Payer: Self-pay | Admitting: Internal Medicine

## 2013-08-30 ENCOUNTER — Ambulatory Visit (INDEPENDENT_AMBULATORY_CARE_PROVIDER_SITE_OTHER): Payer: Medicare Other | Admitting: Internal Medicine

## 2013-08-30 ENCOUNTER — Encounter: Payer: Self-pay | Admitting: Internal Medicine

## 2013-08-30 VITALS — BP 118/78 | HR 90 | Temp 98.1°F | Resp 16 | Ht 62.0 in | Wt 125.0 lb

## 2013-08-30 DIAGNOSIS — L859 Epidermal thickening, unspecified: Secondary | ICD-10-CM | POA: Insufficient documentation

## 2013-08-30 DIAGNOSIS — IMO0001 Reserved for inherently not codable concepts without codable children: Secondary | ICD-10-CM

## 2013-08-30 DIAGNOSIS — L851 Acquired keratosis [keratoderma] palmaris et plantaris: Secondary | ICD-10-CM

## 2013-08-30 DIAGNOSIS — E1165 Type 2 diabetes mellitus with hyperglycemia: Secondary | ICD-10-CM

## 2013-08-30 DIAGNOSIS — F988 Other specified behavioral and emotional disorders with onset usually occurring in childhood and adolescence: Secondary | ICD-10-CM

## 2013-08-30 MED ORDER — AMPHETAMINE-DEXTROAMPHETAMINE 20 MG PO TABS: 20.0000 mg | ORAL_TABLET | Freq: Two times a day (BID) | ORAL | Status: DC

## 2013-08-30 MED ORDER — AMMONIUM LACTATE 12 % EX CREA: TOPICAL_CREAM | CUTANEOUS | Status: DC | PRN

## 2013-08-30 NOTE — Progress Notes (Signed)
Subjective:    Patient ID: Jill Chandler, female    DOB: October 21, 1945, 68 y.o.   MRN: [id number]  Diabetes She presents for her follow-up diabetic visit. She has type 2 diabetes mellitus. Her disease course has been stable. There are no hypoglycemic associated symptoms. Pertinent negatives for hypoglycemia include no confusion, nervousness/anxiousness or pallor. Pertinent negatives for diabetes include no blurred vision, no chest pain, no fatigue, no foot paresthesias, no foot ulcerations, no polydipsia, no polyphagia, no polyuria, no visual change, no weakness and no weight loss. There are no hypoglycemic complications. Symptoms are stable. There are no diabetic complications. Current diabetic treatment includes diet. She is compliant with treatment all of the time. She is following a generally healthy diet. Meal planning includes avoidance of concentrated sweets. She participates in exercise intermittently. There is no change in her home blood glucose trend. An ACE inhibitor/angiotensin II receptor blocker is not being taken. She does not see a podiatrist.Eye exam is current.      Review of Systems  Constitutional: Negative.  Negative for fever, chills, weight loss, diaphoresis, activity change, appetite change, fatigue and unexpected weight change.  HENT: Negative.   Eyes: Negative.  Negative for blurred vision.  Respiratory: Negative.  Negative for cough, choking, chest tightness, shortness of breath, wheezing and stridor.   Cardiovascular: Negative.  Negative for chest pain, palpitations and leg swelling.  Gastrointestinal: Negative.  Negative for nausea, vomiting, abdominal pain, diarrhea and constipation.  Endocrine: Negative.  Negative for polydipsia, polyphagia and polyuria.  Genitourinary: Negative.   Musculoskeletal: Negative.  Negative for arthralgias, back pain, gait problem, joint swelling, myalgias, neck pain and neck stiffness.  Skin: Positive for rash. Negative for color change,  pallor and wound.       She has a scaly spot on her left elbow for over a year, she tells me that she saw a dermatologist and tried a topical steroid cream without much relief. She says that she does not prop on that elbow and that does not pick at the area. It feels sore at times but but it does not itch or bleed.  Allergic/Immunologic: Negative.   Neurological: Negative.  Negative for weakness.  Hematological: Negative.  Negative for adenopathy. Does not bruise/bleed easily.  Psychiatric/Behavioral: Positive for decreased concentration. Negative for suicidal ideas, hallucinations, behavioral problems, confusion, sleep disturbance, self-injury, dysphoric mood and agitation. The patient is not nervous/anxious and is not hyperactive.        Objective:   Physical Exam  Vitals reviewed. Constitutional: She is oriented to person, place, and time. She appears well-developed and well-nourished. No distress.  HENT:  Head: Normocephalic and atraumatic.  Mouth/Throat: Oropharynx is clear and moist. No oropharyngeal exudate.  Eyes: Conjunctivae are normal. Right eye exhibits no discharge. Left eye exhibits no discharge. No scleral icterus.  Neck: Normal range of motion. Neck supple. No JVD present. No tracheal deviation present. No thyromegaly present.  Cardiovascular: Normal rate, regular rhythm, normal heart sounds and intact distal pulses.  Exam reveals no gallop and no friction rub.   No murmur heard. Pulmonary/Chest: Effort normal and breath sounds normal. No stridor. No respiratory distress. She has no wheezes. She has no rales. She exhibits no tenderness.  Abdominal: Soft. Bowel sounds are normal. She exhibits no distension and no mass. There is no tenderness. There is no rebound and no guarding.  Musculoskeletal: Normal range of motion. She exhibits no edema and no tenderness.  Lymphadenopathy:    She has no cervical adenopathy.  Neurological:  She is oriented to person, place, and time.    Skin: Skin is warm and dry. No rash noted. She is not diaphoretic. No erythema. No pallor.  Left elbow, over the olecranon there is a 2 cm area of excoriation with scale and lichenification and a small callous formation. There are no pigmented lesions.  Psychiatric: She has a normal mood and affect. Her behavior is normal. Judgment and thought content normal.     Lab Results  Component Value Date   WBC 6.7 02/29/2012   HGB 14.0 02/29/2012   HCT 41.8 02/29/2012   PLT 225.0 02/29/2012   GLUCOSE 98 02/06/2013   CHOL 148 07/26/2013   TRIG 143.0 07/26/2013   HDL 49.60 07/26/2013   LDLDIRECT 54.1 06/09/2012   LDLCALC 70 07/26/2013   ALT 20 06/09/2012   AST 22 06/09/2012   NA 138 02/06/2013   K 3.7 02/06/2013   CL 103 02/06/2013   CREATININE 0.7 02/06/2013   BUN 13 02/06/2013   CO2 29 02/06/2013   TSH 1.66 02/29/2012   HGBA1C 6.5 07/26/2013   MICROALBUR 0.3 02/29/2012       Assessment & Plan:

## 2013-08-30 NOTE — Patient Instructions (Signed)

## 2013-08-30 NOTE — Progress Notes (Signed)
Pre visit review using our clinic review tool, if applicable. No additional management support is needed unless otherwise documented below in the visit note. 

## 2013-08-31 ENCOUNTER — Encounter: Payer: Self-pay | Admitting: Internal Medicine

## 2013-08-31 NOTE — Assessment & Plan Note (Signed)
Her blood sugars are well controlled 

## 2013-08-31 NOTE — Assessment & Plan Note (Signed)
I have asked her to try lac-hydrin for this

## 2013-08-31 NOTE — Assessment & Plan Note (Signed)
She will cont adderall

## 2013-09-05 ENCOUNTER — Encounter: Payer: Self-pay | Admitting: Internal Medicine

## 2013-09-05 DIAGNOSIS — E785 Hyperlipidemia, unspecified: Secondary | ICD-10-CM

## 2013-09-05 DIAGNOSIS — E1165 Type 2 diabetes mellitus with hyperglycemia: Principal | ICD-10-CM

## 2013-09-05 DIAGNOSIS — IMO0001 Reserved for inherently not codable concepts without codable children: Secondary | ICD-10-CM

## 2013-09-05 MED ORDER — SIMVASTATIN 40 MG PO TABS: 40.0000 mg | ORAL_TABLET | Freq: Every day | ORAL | Status: DC

## 2013-09-06 ENCOUNTER — Ambulatory Visit (INDEPENDENT_AMBULATORY_CARE_PROVIDER_SITE_OTHER): Payer: Medicare Other | Admitting: Gynecology

## 2013-09-06 ENCOUNTER — Encounter: Payer: Self-pay | Admitting: Gynecology

## 2013-09-06 ENCOUNTER — Other Ambulatory Visit (HOSPITAL_COMMUNITY)
Admission: RE | Admit: 2013-09-06 | Discharge: 2013-09-06 | Disposition: A | Discharge status: Home / Self Care | Payer: Medicare Other | Source: Ambulatory Visit | Attending: Gynecology | Admitting: Gynecology

## 2013-09-06 VITALS — BP 142/84 | Ht 62.0 in | Wt 127.6 lb

## 2013-09-06 DIAGNOSIS — Z85828 Personal history of other malignant neoplasm of skin: Secondary | ICD-10-CM | POA: Insufficient documentation

## 2013-09-06 DIAGNOSIS — Z01419 Encounter for gynecological examination (general) (routine) without abnormal findings: Secondary | ICD-10-CM | POA: Diagnosis present

## 2013-09-06 DIAGNOSIS — C20 Malignant neoplasm of rectum: Secondary | ICD-10-CM

## 2013-09-06 DIAGNOSIS — D071 Carcinoma in situ of vulva: Secondary | ICD-10-CM

## 2013-09-06 DIAGNOSIS — Z124 Encounter for screening for malignant neoplasm of cervix: Secondary | ICD-10-CM

## 2013-09-06 DIAGNOSIS — D049 Carcinoma in situ of skin, unspecified: Secondary | ICD-10-CM

## 2013-09-06 DIAGNOSIS — Z87410 Personal history of cervical dysplasia: Secondary | ICD-10-CM

## 2013-09-06 DIAGNOSIS — Z1151 Encounter for screening for human papillomavirus (HPV): Secondary | ICD-10-CM | POA: Diagnosis present

## 2013-09-06 DIAGNOSIS — Z87412 Personal history of vulvar dysplasia: Secondary | ICD-10-CM | POA: Insufficient documentation

## 2013-09-06 NOTE — Patient Instructions (Signed)
Colposcopy Colposcopy is a procedure to examine your cervix and vagina, or the area around the outside of your vagina, for abnormalities or signs of disease. The procedure is done using a lighted microscope called a colposcope. Tissue samples may be collected during the colposcopy if your health care provider finds any unusual cells. A colposcopy may be done if a woman has:  An abnormal Pap test. A Pap test is a medical test done to evaluate cells that are on the surface of the cervix.  A Pap test result that is suggestive of human papillomavirus (HPV). This virus can cause genital warts and is linked to the development of cervical cancer.  A sore on her cervix and the results of a Pap test were normal.  Genital warts on the cervix or in or around the outside of the vagina.  A mother who took the drug diethylstilbestrol (DES) while pregnant.  Painful intercourse.  Vaginal bleeding, especially after sexual intercourse. LET YOUR HEALTH CARE PROVIDER KNOW ABOUT:  Any allergies you have.  All medicines you are taking, including vitamins, herbs, eye drops, creams, and over-the-counter medicines.  Previous problems you or members of your family have had with the use of anesthetics.  Any blood disorders you have.  Previous surgeries you have had.  Medical conditions you have. RISKS AND COMPLICATIONS Generally, a colposcopy is a safe procedure. However, as with any procedure, complications can occur. Possible complications include:  Bleeding.  Infection.  Missed lesions. BEFORE THE PROCEDURE   Tell your health care provider if you have your menstrual period. A colposcopy typically is not done during menstruation.  For 24 hours before the colposcopy, do not:  [place].  Use tampons.  Use medicines, creams, or suppositories in the vagina.  Have sexual intercourse. PROCEDURE  During the procedure, you will be lying on your back with your feet in foot rests (stirrups). A warm  metal or plastic instrument (speculum) will be placed in your vagina to keep it open and to allow the health care provider to see the cervix. The colposcope will be placed outside the vagina. It will be used to magnify and examine the cervix, vagina, and the area around the outside of the vagina. A small amount of liquid solution will be placed on the area that is to be viewed. This solution will make it easier to see the abnormal cells. Your health care provider will use tools to suck out mucus and cells from the canal of the cervix. Then he or she will record the location of the abnormal areas. If a biopsy is done during the procedure, a medicine will usually be given to numb the area (local anesthetic). You may feel mild pain or cramping while the biopsy is done. After the procedure, tissue samples collected during the biopsy will be sent to a lab for analysis. AFTER THE PROCEDURE  You will be given instructions on when to follow up with your health care provider for your test results. It is important to keep your appointment. Document Released: 04/04/2002 Document Revised: 09/14/2012 Document Reviewed: 08/11/2012 ExitCare Patient Information 2015 [employer]. This information is not intended to replace advice given to you by your health care provider. Make sure you discuss any questions you have with your health care provider.  

## 2013-09-06 NOTE — Progress Notes (Addendum)
Jill Chandler 12-05-1945 [id number]   History:    68 y.o.  who presented to the office for GYN exam who was previously been followed at [facility]. 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 Patient with history of colon polyps last colonoscopy February of this year by Dr. Carlean Purl.   Patient's Tdap and shingles vaccine are up-to-date. Last Pap smear over 3 years ago. The patient many years ago was on HRT for approximately 10 years.   Past medical history,surgical history, family history and social history were all reviewed and documented in the EPIC chart.  Gynecologic History No LMP recorded. Patient is postmenopausal. Contraception: post menopausal status Last Pap: Several years ago. Results were: See above  Last mammogram: 2014. Results were: normal  Obstetric History OB History  Gravida Para Term Preterm AB SAB TAB Ectopic Multiple Living  2 0   2     0    # Outcome Date GA Lbr Len/2nd Weight Sex Delivery Anes PTL Lv  2 ABT           1 ABT                ROS: A ROS was performed and pertinent positives and negatives are included in the history.  GENERAL: No fevers or chills. HEENT: No change in vision, no earache, sore throat or sinus congestion. NECK: No pain or stiffness. CARDIOVASCULAR: No chest pain or pressure. No palpitations. PULMONARY: No  shortness of breath, cough or wheeze. GASTROINTESTINAL: No abdominal pain, nausea, vomiting or diarrhea, melena or bright red blood per rectum. GENITOURINARY: No urinary frequency, urgency, hesitancy or dysuria. MUSCULOSKELETAL: No joint or muscle pain, no back pain, no recent trauma. DERMATOLOGIC: No rash, no itching, no lesions. ENDOCRINE: No polyuria, polydipsia, no heat or cold intolerance. No recent change in weight. HEMATOLOGICAL: No anemia or easy bruising or bleeding. NEUROLOGIC: No headache, seizures, numbness, tingling or weakness. PSYCHIATRIC: No depression, no loss of interest in normal activity or change in sleep pattern.     Exam: chaperone present  BP 142/84  Ht 5\' 2"  (1.575 m)  Wt 127 lb 9.6 oz (57.879 kg)  BMI 23.33 kg/m2  Body mass index is 23.33 kg/(m^2).  General appearance : Well developed well nourished female. No acute distress HEENT: Neck supple, trachea midline, no carotid bruits, no thyroidmegaly Lungs: Clear to auscultation, no rhonchi or wheezes, or rib retractions  Heart: Regular rate and rhythm, no murmurs or gallops Breast:Examined in sitting and supine position were symmetrical in appearance, no palpable masses or tenderness,  no skin retraction, no nipple inversion, no nipple discharge, no skin discoloration, no axillary or supraclavicular lymphadenopathy Abdomen: no palpable masses or tenderness, no rebound or guarding Extremities: no edema or skin discoloration or tenderness  Pelvic:  Bartholin, Urethra, Skene Glands: Within normal limits, atrophic changes  Vagina: No gross lesions or discharge  Cervix: No gross lesions or discharge, flat  Uterus  axial, normal size, shape and consistency, non-tender and mobile  Adnexa  Without masses or tenderness  Anus and perineum  normal   Rectovaginal  normal sphincter tone without palpated masses or tenderness             Hemoccult colonoscopy February polyps detected     Assessment/Plan:  68 y.o.  female  for GYN exam with extensive past GYN history for cervical, vaginal and vulvar dysplasia as described above. Patient will be asked to return to the office in a few weeks since it has been several years since she has had a detail colposcopic exam. Pap smear with HPV screen was obtained today. Her PCP Dr. Eilleen Kempf has been doing her blood work. She will need a bone density study next year. We discussed importance of calcium vitamin D and regular exercise for osteoporosis prevention. We discussed importance of monthly self breast exams.  Note: This dictation was prepared with  Dragon/digital dictation along withSmart phrase technology. Any transcriptional errors that result from this process are unintentional.   Terrance Mass MD, 2:50 PM 09/06/2013

## 2013-09-11 LAB — CYTOLOGY - PAP

## 2013-09-12 ENCOUNTER — Telehealth: Payer: Self-pay | Admitting: *Deleted

## 2013-09-12 NOTE — Telephone Encounter (Signed)
Left msg on triage requesting call back on Pt rx's on her Adderral. When calling back use ref# [id number]. Called back spoke with Nadine/rep she stated the pharmacist is not in will have to call back...Johny Chess

## 2013-09-13 NOTE — Telephone Encounter (Signed)
Called [facility] back spoke with Ruben/pharmacist they received 4 rx's on pt Adderral script can only filled up to 90 day. Will keep November script on file...Johny Chess

## 2013-09-18 ENCOUNTER — Encounter: Payer: Self-pay | Admitting: Internal Medicine

## 2013-09-19 ENCOUNTER — Other Ambulatory Visit: Payer: Self-pay | Admitting: Internal Medicine

## 2013-09-19 DIAGNOSIS — F988 Other specified behavioral and emotional disorders with onset usually occurring in childhood and adolescence: Secondary | ICD-10-CM

## 2013-09-19 MED ORDER — AMPHETAMINE-DEXTROAMPHETAMINE 20 MG PO TABS: 20.0000 mg | ORAL_TABLET | Freq: Two times a day (BID) | ORAL | Status: DC

## 2013-10-02 ENCOUNTER — Encounter: Payer: Self-pay | Admitting: Internal Medicine

## 2013-10-09 ENCOUNTER — Ambulatory Visit (INDEPENDENT_AMBULATORY_CARE_PROVIDER_SITE_OTHER): Payer: Medicare Other | Admitting: Gynecology

## 2013-10-09 ENCOUNTER — Encounter: Payer: Self-pay | Admitting: Gynecology

## 2013-10-09 VITALS — BP 138/80

## 2013-10-09 DIAGNOSIS — Z87410 Personal history of cervical dysplasia: Secondary | ICD-10-CM

## 2013-10-09 DIAGNOSIS — Z23 Encounter for immunization: Secondary | ICD-10-CM

## 2013-10-09 DIAGNOSIS — Z87411 Personal history of vaginal dysplasia: Secondary | ICD-10-CM

## 2013-10-09 DIAGNOSIS — Z87412 Personal history of vulvar dysplasia: Secondary | ICD-10-CM

## 2013-10-09 NOTE — Progress Notes (Signed)
   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  Patient underwent the detail colposcopic evaluation the external genitalia, perineum and perirectal region with no lesions seen. The speculum was inserted. Acetic acid was applied [place]. Systematic inspection the vagina fornix and cervix did not demonstrate any lesions.  Of note an area of scar tissue was noted at the inferior portion of the right labia minora which patient states it has been there for many years.  Patient will return back to the office in one year for her annual exam. I recommended that we followup yearly local postop evaluation. Patient states her colonoscopy mammograms are up-to-date. Her bone density was last year.

## 2013-11-27 ENCOUNTER — Encounter: Payer: Self-pay | Admitting: Gynecology

## 2013-12-11 ENCOUNTER — Other Ambulatory Visit: Payer: Self-pay | Admitting: Internal Medicine

## 2013-12-11 ENCOUNTER — Encounter: Payer: Self-pay | Admitting: Gynecology

## 2013-12-11 DIAGNOSIS — F988 Other specified behavioral and emotional disorders with onset usually occurring in childhood and adolescence: Secondary | ICD-10-CM

## 2013-12-11 MED ORDER — AMPHETAMINE-DEXTROAMPHETAMINE 20 MG PO TABS: 20.0000 mg | ORAL_TABLET | Freq: Two times a day (BID) | ORAL | Status: DC

## 2013-12-13 ENCOUNTER — Encounter: Payer: Self-pay | Admitting: Internal Medicine

## 2013-12-13 ENCOUNTER — Encounter: Payer: Self-pay | Admitting: *Deleted

## 2013-12-13 ENCOUNTER — Other Ambulatory Visit: Payer: Self-pay | Admitting: Internal Medicine

## 2013-12-13 DIAGNOSIS — F329 Major depressive disorder, single episode, unspecified: Secondary | ICD-10-CM

## 2013-12-13 DIAGNOSIS — F32A Depression, unspecified: Secondary | ICD-10-CM

## 2013-12-13 MED ORDER — FLUOXETINE HCL 40 MG PO CAPS: 40.0000 mg | ORAL_CAPSULE | Freq: Two times a day (BID) | ORAL | Status: DC

## 2013-12-14 ENCOUNTER — Encounter: Payer: Self-pay | Admitting: Internal Medicine

## 2013-12-14 ENCOUNTER — Other Ambulatory Visit: Payer: Self-pay | Admitting: Internal Medicine

## 2013-12-14 ENCOUNTER — Encounter: Payer: Self-pay | Admitting: *Deleted

## 2013-12-14 DIAGNOSIS — F329 Major depressive disorder, single episode, unspecified: Secondary | ICD-10-CM

## 2013-12-14 DIAGNOSIS — F32A Depression, unspecified: Secondary | ICD-10-CM

## 2013-12-14 MED ORDER — FLUOXETINE HCL 40 MG PO CAPS: 40.0000 mg | ORAL_CAPSULE | Freq: Two times a day (BID) | ORAL | Status: DC

## 2014-01-04 ENCOUNTER — Other Ambulatory Visit: Payer: Self-pay

## 2014-01-04 DIAGNOSIS — F329 Major depressive disorder, single episode, unspecified: Secondary | ICD-10-CM

## 2014-01-04 DIAGNOSIS — F32A Depression, unspecified: Secondary | ICD-10-CM

## 2014-01-04 MED ORDER — TRAZODONE HCL 100 MG PO TABS: 150.0000 mg | ORAL_TABLET | Freq: Every evening | ORAL | Status: DC | PRN

## 2014-01-04 MED ORDER — FLUOXETINE HCL 40 MG PO CAPS: 40.0000 mg | ORAL_CAPSULE | Freq: Two times a day (BID) | ORAL | Status: DC

## 2014-01-10 LAB — HM DIABETES EYE EXAM

## 2014-01-12 ENCOUNTER — Encounter: Payer: Self-pay | Admitting: Internal Medicine

## 2014-01-15 ENCOUNTER — Other Ambulatory Visit: Payer: Self-pay | Admitting: Internal Medicine

## 2014-01-15 DIAGNOSIS — F329 Major depressive disorder, single episode, unspecified: Secondary | ICD-10-CM

## 2014-01-15 DIAGNOSIS — F32A Depression, unspecified: Secondary | ICD-10-CM

## 2014-01-15 MED ORDER — TRAZODONE HCL 150 MG PO TABS
150.0000 mg | ORAL_TABLET | Freq: Every evening | ORAL | Status: DC | PRN
Start: 2014-01-15 — End: 2014-12-25

## 2014-01-15 MED ORDER — TRAZODONE HCL 150 MG PO TABS: 150.0000 mg | ORAL_TABLET | Freq: Every evening | ORAL | Status: DC | PRN

## 2014-01-16 ENCOUNTER — Other Ambulatory Visit: Payer: Self-pay | Admitting: Internal Medicine

## 2014-01-22 ENCOUNTER — Ambulatory Visit: Payer: Medicare Other | Admitting: Internal Medicine

## 2014-01-29 ENCOUNTER — Ambulatory Visit (INDEPENDENT_AMBULATORY_CARE_PROVIDER_SITE_OTHER): Payer: Medicare Other | Admitting: Internal Medicine

## 2014-01-29 ENCOUNTER — Other Ambulatory Visit (INDEPENDENT_AMBULATORY_CARE_PROVIDER_SITE_OTHER): Payer: Medicare Other

## 2014-01-29 ENCOUNTER — Encounter: Payer: Self-pay | Admitting: Internal Medicine

## 2014-01-29 VITALS — BP 140/88 | HR 101 | Temp 97.0°F | Ht 62.0 in | Wt 129.0 lb

## 2014-01-29 DIAGNOSIS — E118 Type 2 diabetes mellitus with unspecified complications: Secondary | ICD-10-CM

## 2014-01-29 DIAGNOSIS — E785 Hyperlipidemia, unspecified: Secondary | ICD-10-CM

## 2014-01-29 LAB — CBC WITH DIFFERENTIAL/PLATELET
BASOS ABS: 0 10*3/uL (ref 0.0–0.1)
Basophils Relative: 0.4 % (ref 0.0–3.0)
EOS ABS: 0.1 10*3/uL (ref 0.0–0.7)
Eosinophils Relative: 1.4 % (ref 0.0–5.0)
HEMATOCRIT: 45.1 % (ref 36.0–46.0)
Hemoglobin: 14.6 g/dL (ref 12.0–15.0)
Lymphocytes Relative: 19.5 % (ref 12.0–46.0)
Lymphs Abs: 1.4 10*3/uL (ref 0.7–4.0)
MCHC: 32.4 g/dL (ref 30.0–36.0)
MCV: 81.7 fl (ref 78.0–100.0)
MONOS PCT: 6.2 % (ref 3.0–12.0)
Monocytes Absolute: 0.4 10*3/uL (ref 0.1–1.0)
NEUTROS ABS: 5.2 10*3/uL (ref 1.4–7.7)
Neutrophils Relative %: 72.5 % (ref 43.0–77.0)
Platelets: 260 10*3/uL (ref 150.0–400.0)
RBC: 5.52 Mil/uL — ABNORMAL HIGH (ref 3.87–5.11)
RDW: 14.1 % (ref 11.5–15.5)
WBC: 7.2 10*3/uL (ref 4.0–10.5)

## 2014-01-29 LAB — COMPREHENSIVE METABOLIC PANEL
ALK PHOS: 76 U/L (ref 39–117)
ALT: 18 U/L (ref 0–35)
AST: 21 U/L (ref 0–37)
Albumin: 4.2 g/dL (ref 3.5–5.2)
BILIRUBIN TOTAL: 0.5 mg/dL (ref 0.2–1.2)
BUN: 15 mg/dL (ref 6–23)
CO2: 29 meq/L (ref 19–32)
Calcium: 9.7 mg/dL (ref 8.4–10.5)
Chloride: 100 mEq/L (ref 96–112)
Creatinine, Ser: 0.8 mg/dL (ref 0.4–1.2)
GFR: 78.44 mL/min (ref >60.00)
Glucose, Bld: 185 mg/dL — ABNORMAL HIGH (ref 70–99)
POTASSIUM: 3.7 meq/L (ref 3.5–5.1)
SODIUM: 137 meq/L (ref 135–145)
TOTAL PROTEIN: 6.9 g/dL (ref 6.0–8.3)

## 2014-01-29 LAB — LIPID PANEL
Cholesterol: 167 mg/dL (ref 0–200)
HDL: 43.7 mg/dL (ref >39.00)
NonHDL: 123.3
Total CHOL/HDL Ratio: 4
Triglycerides: 268 mg/dL — ABNORMAL HIGH (ref 0.0–149.0)
VLDL: 53.6 mg/dL — ABNORMAL HIGH (ref 0.0–40.0)

## 2014-01-29 LAB — HEMOGLOBIN A1C: Hgb A1c MFr Bld: 7.4 % — ABNORMAL HIGH (ref 4.6–6.5)

## 2014-01-29 LAB — TSH: TSH: 1.89 u[IU]/mL (ref 0.35–4.50)

## 2014-01-29 NOTE — Assessment & Plan Note (Signed)
She is doing well on the statin I will check her FLP to see if she has achieved her LDL goal

## 2014-01-29 NOTE — Progress Notes (Signed)
Subjective:    Patient ID: Jill Chandler, female    DOB: Dec 13, 1945, 69 y.o.   MRN: [id number]  Diabetes She presents for her follow-up diabetic visit. She has type 2 diabetes mellitus. Her disease course has been stable. There are no hypoglycemic associated symptoms. Associated symptoms include polydipsia. Pertinent negatives for diabetes include no blurred vision, no chest pain, no fatigue, no foot paresthesias, no foot ulcerations, no polyphagia, no polyuria, no visual change, no weakness and no weight loss. There are no hypoglycemic complications. Symptoms are stable. There are no diabetic complications. Current diabetic treatment includes diet. Her weight is stable. She is following a generally healthy diet. Meal planning includes avoidance of concentrated sweets. She participates in exercise intermittently. Her home blood glucose trend is increasing steadily. An ACE inhibitor/angiotensin II receptor blocker is not being taken. She does not see a podiatrist.Eye exam is current.      Review of Systems  Constitutional: Negative.  Negative for fever, chills, weight loss, diaphoresis, appetite change and fatigue.  HENT: Negative.   Eyes: Negative.  Negative for blurred vision.  Respiratory: Negative.  Negative for cough, choking, chest tightness, shortness of breath and stridor.   Cardiovascular: Negative.  Negative for chest pain, palpitations and leg swelling.  Gastrointestinal: Negative.  Negative for nausea, vomiting, abdominal pain, diarrhea and constipation.  Endocrine: Positive for polydipsia. Negative for polyphagia and polyuria.  Genitourinary: Negative.   Musculoskeletal: Negative.  Negative for myalgias, back pain, joint swelling and arthralgias.  Skin: Negative.  Negative for rash.  Allergic/Immunologic: Negative.   Neurological: Negative.  Negative for weakness.  Hematological: Negative.  Negative for adenopathy. Does not bruise/bleed easily.  Psychiatric/Behavioral: Negative.         Objective:   Physical Exam  Constitutional: She is oriented to person, place, and time. She appears well-developed and well-nourished. No distress.  HENT:  Head: Normocephalic and atraumatic.  Mouth/Throat: Oropharynx is clear and moist. No oropharyngeal exudate.  Eyes: Conjunctivae are normal. Right eye exhibits no discharge. Left eye exhibits no discharge. No scleral icterus.  Neck: Normal range of motion. Neck supple. No JVD present. No tracheal deviation present. No thyromegaly present.  Cardiovascular: Normal rate, regular rhythm, normal heart sounds and intact distal pulses.  Exam reveals no gallop and no friction rub.   No murmur heard. Pulmonary/Chest: Effort normal and breath sounds normal. No stridor. No respiratory distress. She has no wheezes. She has no rales. She exhibits no tenderness.  Abdominal: Soft. Bowel sounds are normal. She exhibits no distension and no mass. There is no tenderness. There is no rebound and no guarding.  Musculoskeletal: Normal range of motion. She exhibits no edema or tenderness.  Lymphadenopathy:    She has no cervical adenopathy.  Neurological: She is oriented to person, place, and time.  Skin: Skin is warm and dry. No rash noted. She is not diaphoretic. No erythema. No pallor.  Vitals reviewed.    Lab Results  Component Value Date   WBC 6.7 02/29/2012   HGB 14.0 02/29/2012   HCT 41.8 02/29/2012   PLT 225.0 02/29/2012   GLUCOSE 98 02/06/2013   CHOL 148 07/26/2013   TRIG 143.0 07/26/2013   HDL 49.60 07/26/2013   LDLDIRECT 54.1 06/09/2012   LDLCALC 70 07/26/2013   ALT 20 06/09/2012   AST 22 06/09/2012   NA 138 02/06/2013   K 3.7 02/06/2013   CL 103 02/06/2013   CREATININE 0.7 02/06/2013   BUN 13 02/06/2013   CO2 29 02/06/2013  TSH 1.66 02/29/2012   HGBA1C 6.5 07/26/2013   MICROALBUR 0.3 02/29/2012       Assessment & Plan:

## 2014-01-29 NOTE — Patient Instructions (Signed)

## 2014-01-29 NOTE — Assessment & Plan Note (Signed)
She appears to be doing well I will recheck her A1C and will treat if needed

## 2014-01-30 ENCOUNTER — Encounter: Payer: Self-pay | Admitting: Internal Medicine

## 2014-01-30 LAB — LDL CHOLESTEROL, DIRECT: Direct LDL: 88.5 mg/dL

## 2014-02-19 ENCOUNTER — Encounter: Payer: Self-pay | Admitting: Internal Medicine

## 2014-02-20 ENCOUNTER — Other Ambulatory Visit: Payer: Self-pay | Admitting: Internal Medicine

## 2014-02-20 DIAGNOSIS — E118 Type 2 diabetes mellitus with unspecified complications: Secondary | ICD-10-CM

## 2014-02-20 MED ORDER — METFORMIN HCL 1000 MG PO TABS: 1000.0000 mg | ORAL_TABLET | Freq: Two times a day (BID) | ORAL | Status: DC

## 2014-04-23 ENCOUNTER — Other Ambulatory Visit: Payer: Self-pay | Admitting: Internal Medicine

## 2014-04-23 DIAGNOSIS — F988 Other specified behavioral and emotional disorders with onset usually occurring in childhood and adolescence: Secondary | ICD-10-CM

## 2014-04-23 MED ORDER — AMPHETAMINE-DEXTROAMPHETAMINE 20 MG PO TABS: 20.0000 mg | ORAL_TABLET | Freq: Two times a day (BID) | ORAL | Status: DC

## 2014-06-19 ENCOUNTER — Ambulatory Visit (INDEPENDENT_AMBULATORY_CARE_PROVIDER_SITE_OTHER): Payer: Medicare Other | Admitting: Internal Medicine

## 2014-06-19 ENCOUNTER — Other Ambulatory Visit (INDEPENDENT_AMBULATORY_CARE_PROVIDER_SITE_OTHER): Payer: Medicare Other

## 2014-06-19 ENCOUNTER — Encounter: Payer: Self-pay | Admitting: Internal Medicine

## 2014-06-19 VITALS — BP 128/68 | HR 65 | Temp 97.8°F | Resp 16 | Ht 62.0 in | Wt 122.0 lb

## 2014-06-19 DIAGNOSIS — E118 Type 2 diabetes mellitus with unspecified complications: Secondary | ICD-10-CM

## 2014-06-19 DIAGNOSIS — F909 Attention-deficit hyperactivity disorder, unspecified type: Secondary | ICD-10-CM

## 2014-06-19 DIAGNOSIS — F988 Other specified behavioral and emotional disorders with onset usually occurring in childhood and adolescence: Secondary | ICD-10-CM

## 2014-06-19 LAB — URINALYSIS, ROUTINE W REFLEX MICROSCOPIC
BILIRUBIN URINE: NEGATIVE
Hgb urine dipstick: NEGATIVE
Ketones, ur: NEGATIVE
LEUKOCYTES UA: NEGATIVE
Nitrite: NEGATIVE
RBC / HPF: NONE SEEN (ref 0–?)
SPECIFIC GRAVITY, URINE: 1.01 (ref 1.000–1.030)
Total Protein, Urine: NEGATIVE
Urine Glucose: NEGATIVE
Urobilinogen, UA: 1 (ref 0.0–1.0)
WBC, UA: NONE SEEN (ref 0–?)
pH: 7 (ref 5.0–8.0)

## 2014-06-19 LAB — BASIC METABOLIC PANEL
BUN: 14 mg/dL (ref 6–23)
CO2: 32 meq/L (ref 19–32)
Calcium: 9.5 mg/dL (ref 8.4–10.5)
Chloride: 99 mEq/L (ref 96–112)
Creatinine, Ser: 0.63 mg/dL (ref 0.40–1.20)
GFR: 100.25 mL/min (ref >60.00)
Glucose, Bld: 115 mg/dL — ABNORMAL HIGH (ref 70–99)
Potassium: 4 mEq/L (ref 3.5–5.1)
Sodium: 135 mEq/L (ref 135–145)

## 2014-06-19 LAB — MICROALBUMIN / CREATININE URINE RATIO
CREATININE, U: 61.4 mg/dL
MICROALB UR: 0.9 mg/dL (ref 0.0–1.9)
Microalb Creat Ratio: 1.5 mg/g (ref 0.0–30.0)

## 2014-06-19 LAB — HEMOGLOBIN A1C: Hgb A1c MFr Bld: 6 % (ref 4.6–6.5)

## 2014-06-19 MED ORDER — ASPIRIN 81 MG PO TABS: 81.0000 mg | ORAL_TABLET | Freq: Every day | ORAL | Status: DC

## 2014-06-19 MED ORDER — AMPHETAMINE-DEXTROAMPHETAMINE 20 MG PO TABS: 20.0000 mg | ORAL_TABLET | Freq: Two times a day (BID) | ORAL | Status: DC

## 2014-06-19 MED ORDER — METFORMIN HCL ER 750 MG PO TB24: 1500.0000 mg | ORAL_TABLET | Freq: Every day | ORAL | Status: DC

## 2014-06-19 NOTE — Progress Notes (Signed)
Pre visit review using our clinic review tool, if applicable. No additional management support is needed unless otherwise documented below in the visit note. 

## 2014-06-19 NOTE — Progress Notes (Signed)
Subjective:  Patient ID: Jill Chandler, female    DOB: [birth year]  Age: 69 y.o. MRN: [id number]  CC: Diabetes   HPI Jill Chandler presents for follow up on DM2 - she complains of GI upset from the current formulation of metformin and thate her blood sugars go up to 140 before meals. She otherwise feels well and offers no complaints.  Outpatient Prescriptions Prior to Visit  Medication Sig Dispense Refill  . ammonium lactate (LAC-HYDRIN) 12 % cream Apply topically as needed for dry skin. 385 g 0  . Calcium Carbonate-Vitamin D (CALCIUM + D PO) Take by mouth.      . Cholecalciferol (VITAMIN D PO) Take by mouth.      . etodolac (LODINE) 500 MG tablet Take 500 mg by mouth as needed.      . fish oil-omega-3 fatty acids 1000 MG capsule Take 2 g by mouth daily.      Marland Kitchen FLUoxetine (PROZAC) 40 MG capsule TAKE 1 BY MOUTH TWICE DAILY 180 capsule 2  . omeprazole (PRILOSEC) 20 MG capsule Take 1 capsule (20 mg total) by mouth daily. 90 capsule 1  . ONETOUCH VERIO test strip test twice daily as directed. 200 each 3  . simvastatin (ZOCOR) 40 MG tablet Take 1 tablet (40 mg total) by mouth at bedtime. 90 tablet 3  . traZODone (DESYREL) 150 MG tablet Take 1 tablet (150 mg total) by mouth at bedtime as needed for sleep. 90 tablet 3  . amphetamine-dextroamphetamine (ADDERALL) 20 MG tablet Take 1 tablet (20 mg total) by mouth 2 (two) times daily. 180 tablet 0  . metFORMIN (GLUCOPHAGE) 1000 MG tablet Take 1 tablet (1,000 mg total) by mouth 2 (two) times daily with a meal. 180 tablet 1   No facility-administered medications prior to visit.    ROS Review of Systems  Constitutional: Negative.  Negative for fever, chills, diaphoresis, activity change, appetite change, fatigue and unexpected weight change.  HENT: Negative.   Eyes: Negative.   Respiratory: Negative.  Negative for cough, choking, chest tightness, shortness of breath and stridor.   Cardiovascular: Negative.  Negative for chest pain, palpitations and  leg swelling.  Gastrointestinal: Positive for diarrhea. Negative for nausea, vomiting, abdominal pain, constipation, blood in stool, abdominal distention, anal bleeding and rectal pain.  Endocrine: Negative.   Genitourinary: Negative.  Negative for urgency, frequency, flank pain, decreased urine volume and difficulty urinating.  Musculoskeletal: Negative.   Skin: Negative.  Negative for rash.  Allergic/Immunologic: Negative.   Neurological: Negative.  Negative for dizziness, syncope, speech difficulty, light-headedness and numbness.  Hematological: Negative.  Negative for adenopathy. Does not bruise/bleed easily.  Psychiatric/Behavioral: Positive for decreased concentration. Negative for suicidal ideas, hallucinations, behavioral problems, confusion, sleep disturbance, self-injury, dysphoric mood and agitation. The patient is not nervous/anxious and is not hyperactive.     Objective:  BP 128/68 mmHg  Pulse 65  Temp(Src) 97.8 F (36.6 C) (Oral)  Resp 16  Ht 5\' 2"  (1.575 m)  Wt 122 lb (55.339 kg)  BMI 22.31 kg/m2  SpO2 98%  BP Readings from Last 3 Encounters:  06/19/14 128/68  01/29/14 140/88  10/09/13 138/80    Wt Readings from Last 3 Encounters:  06/19/14 122 lb (55.339 kg)  01/29/14 129 lb (58.514 kg)  09/06/13 127 lb 9.6 oz (57.879 kg)    Physical Exam  Constitutional: She is oriented to person, place, and time. She appears well-developed and well-nourished. No distress.  HENT:  Head: Normocephalic and atraumatic.  Mouth/Throat: Oropharynx  is clear and moist. No oropharyngeal exudate.  Eyes: Conjunctivae are normal. Right eye exhibits no discharge. Left eye exhibits no discharge. No scleral icterus.  Neck: Normal range of motion. Neck supple. No JVD present. No tracheal deviation present. No thyromegaly present.  Cardiovascular: Normal rate, regular rhythm, normal heart sounds and intact distal pulses.  Exam reveals no gallop and no friction rub.   No murmur  heard. Pulmonary/Chest: Effort normal and breath sounds normal. No stridor. No respiratory distress. She has no wheezes. She has no rales. She exhibits no tenderness.  Abdominal: Soft. Bowel sounds are normal. She exhibits no distension and no mass. There is no tenderness. There is no rebound and no guarding.  Musculoskeletal: Normal range of motion. She exhibits no edema or tenderness.  Lymphadenopathy:    She has no cervical adenopathy.  Neurological: She is oriented to person, place, and time.  Skin: Skin is warm and dry. No rash noted. She is not diaphoretic. No erythema. No pallor.  Psychiatric: She has a normal mood and affect. Her behavior is normal. Judgment and thought content normal.  Vitals reviewed.   Lab Results  Component Value Date   WBC 7.2 01/29/2014   HGB 14.6 01/29/2014   HCT 45.1 01/29/2014   PLT 260.0 01/29/2014   GLUCOSE 115* 06/19/2014   CHOL 167 01/29/2014   TRIG 268.0* 01/29/2014   HDL 43.70 01/29/2014   LDLDIRECT 88.5 01/29/2014   LDLCALC 70 07/26/2013   ALT 18 01/29/2014   AST 21 01/29/2014   NA 135 06/19/2014   K 4.0 06/19/2014   CL 99 06/19/2014   CREATININE 0.63 06/19/2014   BUN 14 06/19/2014   CO2 32 06/19/2014   TSH 1.89 01/29/2014   HGBA1C 6.0 06/19/2014   MICROALBUR 0.9 06/19/2014    No results found.  Assessment & Plan:   Jill Chandler was seen today for diabetes.  Diagnoses and all orders for this visit:  Type II diabetes mellitus with manifestations - her blood sugars are well controled, will change metformin to an XR formulation but her A1C is down to 6% so will decrease the dose Orders: -     metFORMIN (GLUCOPHAGE-XR) 750 MG 24 hr tablet; Take 2 tablets (1,500 mg total) by mouth daily with breakfast. -     Basic metabolic panel; Future -     Microalbumin / creatinine urine ratio; Future -     Hemoglobin A1c; Future -     Urinalysis, Routine w reflex microscopic; Future -     aspirin 81 MG tablet; Take 1 tablet (81 mg total) by mouth  daily.  ADD (attention deficit disorder) Orders: -     amphetamine-dextroamphetamine (ADDERALL) 20 MG tablet; Take 1 tablet (20 mg total) by mouth 2 (two) times daily.  I have discontinued [name] metFORMIN. I have also changed her metFORMIN. Additionally, I am having her start on aspirin. Lastly, I am having her maintain her Calcium Carbonate-Vitamin D (CALCIUM + D PO), etodolac, fish oil-omega-3 fatty acids, Cholecalciferol (VITAMIN D PO), omeprazole, ONETOUCH VERIO, ammonium lactate, simvastatin, traZODone, FLUoxetine, and amphetamine-dextroamphetamine.  Meds ordered this encounter  Medications  . DISCONTD: metFORMIN (GLUCOPHAGE-XR) 750 MG 24 hr tablet    Sig: Take 2 tablets (1,500 mg total) by mouth daily with breakfast.    Dispense:  180 tablet    Refill:  3  . amphetamine-dextroamphetamine (ADDERALL) 20 MG tablet    Sig: Take 1 tablet (20 mg total) by mouth 2 (two) times daily.    Dispense:  180 tablet    Refill:  0    Fill on or after 06/19/14  . aspirin 81 MG tablet    Sig: Take 1 tablet (81 mg total) by mouth daily.    Dispense:  90 tablet    Refill:  3  . metFORMIN (GLUCOPHAGE-XR) 750 MG 24 hr tablet    Sig: Take 1 tablet (750 mg total) by mouth daily with breakfast.    Dispense:  90 tablet    Refill:  3    PLEASE CANCEL THE RX FROM YESTERDAY     Follow-up: Return in about 4 months (around 10/20/2014).  Scarlette Calico, MD

## 2014-06-19 NOTE — Patient Instructions (Signed)

## 2014-06-20 ENCOUNTER — Encounter: Payer: Self-pay | Admitting: Internal Medicine

## 2014-06-20 MED ORDER — METFORMIN HCL ER 750 MG PO TB24: 750.0000 mg | ORAL_TABLET | Freq: Every day | ORAL | Status: DC

## 2014-07-16 ENCOUNTER — Other Ambulatory Visit: Payer: Self-pay | Admitting: Internal Medicine

## 2014-08-01 ENCOUNTER — Encounter: Payer: Self-pay | Admitting: Internal Medicine

## 2014-08-01 ENCOUNTER — Other Ambulatory Visit: Payer: Self-pay | Admitting: Internal Medicine

## 2014-08-01 DIAGNOSIS — F988 Other specified behavioral and emotional disorders with onset usually occurring in childhood and adolescence: Secondary | ICD-10-CM

## 2014-08-01 MED ORDER — AMPHETAMINE-DEXTROAMPHETAMINE 20 MG PO TABS: 20.0000 mg | ORAL_TABLET | Freq: Two times a day (BID) | ORAL | Status: DC

## 2014-08-01 NOTE — Addendum Note (Signed)
Addended by: Janith Lima on: 08/01/2014 05:01 PM   Modules accepted: Orders

## 2014-08-28 LAB — HM MAMMOGRAPHY: HM Mammogram: NORMAL

## 2014-09-24 ENCOUNTER — Other Ambulatory Visit: Payer: Self-pay | Admitting: Internal Medicine

## 2014-10-30 ENCOUNTER — Encounter: Payer: Self-pay | Admitting: Internal Medicine

## 2014-10-31 ENCOUNTER — Other Ambulatory Visit: Payer: Self-pay

## 2014-10-31 DIAGNOSIS — F988 Other specified behavioral and emotional disorders with onset usually occurring in childhood and adolescence: Secondary | ICD-10-CM

## 2014-10-31 MED ORDER — AMPHETAMINE-DEXTROAMPHETAMINE 20 MG PO TABS: 20.0000 mg | ORAL_TABLET | Freq: Two times a day (BID) | ORAL | Status: DC

## 2014-10-31 NOTE — Telephone Encounter (Signed)
Pt requesting rf. Please Jill Chandler, PCP out of office

## 2014-11-23 ENCOUNTER — Other Ambulatory Visit: Payer: Self-pay

## 2014-11-23 MED ORDER — GLUCOSE BLOOD VI STRP: ORAL_STRIP | Status: DC

## 2014-11-28 ENCOUNTER — Ambulatory Visit: Payer: Medicare Other | Admitting: Internal Medicine

## 2014-12-05 ENCOUNTER — Other Ambulatory Visit (INDEPENDENT_AMBULATORY_CARE_PROVIDER_SITE_OTHER): Payer: Medicare Other

## 2014-12-05 ENCOUNTER — Ambulatory Visit (INDEPENDENT_AMBULATORY_CARE_PROVIDER_SITE_OTHER): Payer: Medicare Other | Admitting: Internal Medicine

## 2014-12-05 ENCOUNTER — Encounter: Payer: Self-pay | Admitting: Internal Medicine

## 2014-12-05 VITALS — BP 126/80 | HR 97 | Temp 97.8°F | Resp 20 | Ht 62.0 in | Wt 124.2 lb

## 2014-12-05 DIAGNOSIS — E785 Hyperlipidemia, unspecified: Secondary | ICD-10-CM

## 2014-12-05 DIAGNOSIS — M810 Age-related osteoporosis without current pathological fracture: Secondary | ICD-10-CM

## 2014-12-05 DIAGNOSIS — E118 Type 2 diabetes mellitus with unspecified complications: Secondary | ICD-10-CM | POA: Diagnosis not present

## 2014-12-05 DIAGNOSIS — Z23 Encounter for immunization: Secondary | ICD-10-CM

## 2014-12-05 LAB — BASIC METABOLIC PANEL
BUN: 10 mg/dL (ref 6–23)
CALCIUM: 9.6 mg/dL (ref 8.4–10.5)
CO2: 27 meq/L (ref 19–32)
Chloride: 99 mEq/L (ref 96–112)
Creatinine, Ser: 0.71 mg/dL (ref 0.40–1.20)
GFR: 87.21 mL/min (ref >60.00)
GLUCOSE: 148 mg/dL — AB (ref 70–99)
Potassium: 3.9 mEq/L (ref 3.5–5.1)
Sodium: 136 mEq/L (ref 135–145)

## 2014-12-05 LAB — LDL CHOLESTEROL, DIRECT: Direct LDL: 86 mg/dL

## 2014-12-05 LAB — HEMOGLOBIN A1C: Hgb A1c MFr Bld: 6.2 % (ref 4.6–6.5)

## 2014-12-05 LAB — LIPID PANEL
CHOL/HDL RATIO: 3
CHOLESTEROL: 162 mg/dL (ref 0–200)
HDL: 47.3 mg/dL (ref >39.00)
NonHDL: 114.82
TRIGLYCERIDES: 204 mg/dL — AB (ref 0.0–149.0)
VLDL: 40.8 mg/dL — AB (ref 0.0–40.0)

## 2014-12-05 NOTE — Progress Notes (Signed)
Pre visit review using our clinic review tool, if applicable. No additional management support is needed unless otherwise documented below in the visit note. 

## 2014-12-05 NOTE — Patient Instructions (Signed)

## 2014-12-06 ENCOUNTER — Encounter: Payer: Self-pay | Admitting: Internal Medicine

## 2014-12-06 LAB — VITAMIN D 25 HYDROXY (VIT D DEFICIENCY, FRACTURES): VITD: 71.56 ng/mL (ref 30.00–100.00)

## 2014-12-06 MED ORDER — GLUCOSE BLOOD VI STRP: ORAL_STRIP | Status: DC

## 2014-12-06 NOTE — Progress Notes (Signed)
Subjective:  Patient ID: Jill Chandler, female    DOB: November 15, 1945  Age: 69 y.o. MRN: [id number]  CC: Hyperlipidemia and Diabetes   HPI Jill Chandler presents for a follow-up on high cholesterol and diabetes. She admits that for the last week or 2 she has not been compliant with her dietary regimen and has been eating chocolate chip cookies again. She offers no complaints today.  Outpatient Prescriptions Prior to Visit  Medication Sig Dispense Refill  . ammonium lactate (LAC-HYDRIN) 12 % cream Apply topically as needed for dry skin. 385 g 0  . amphetamine-dextroamphetamine (ADDERALL) 20 MG tablet Take 1 tablet (20 mg total) by mouth 2 (two) times daily. 180 tablet 0  . Calcium Carbonate-Vitamin D (CALCIUM + D PO) Take by mouth.      . Cholecalciferol (VITAMIN D PO) Take by mouth.      . fish oil-omega-3 fatty acids 1000 MG capsule Take 2 g by mouth daily.      Marland Kitchen FLUoxetine (PROZAC) 40 MG capsule TAKE 1 BY MOUTH TWICE DAILY 180 capsule 3  . glucose blood (ONETOUCH VERIO) test strip Use as instructed to check blood sugar DX:E11.8 200 each 3  . metFORMIN (GLUCOPHAGE-XR) 750 MG 24 hr tablet Take 1 tablet (750 mg total) by mouth daily with breakfast. 90 tablet 3  . omeprazole (PRILOSEC) 20 MG capsule Take 1 capsule (20 mg total) by mouth daily. 90 capsule 1  . simvastatin (ZOCOR) 40 MG tablet TAKE 1 BY MOUTH AT BEDTIME 90 tablet 3  . traZODone (DESYREL) 150 MG tablet Take 1 tablet (150 mg total) by mouth at bedtime as needed for sleep. 90 tablet 3  . aspirin 81 MG tablet Take 1 tablet (81 mg total) by mouth daily. 90 tablet 3  . etodolac (LODINE) 500 MG tablet Take 500 mg by mouth as needed.      Marland Kitchen omeprazole (PRILOSEC) 20 MG capsule TAKE 1 BY MOUTH DAILY AS NEEDED 90 capsule 1   No facility-administered medications prior to visit.    ROS Review of Systems  Constitutional: Negative.  Negative for fever, chills, diaphoresis, appetite change and fatigue.  HENT: Negative.   Eyes: Negative.     Respiratory: Negative.  Negative for cough, choking, chest tightness, shortness of breath and stridor.   Cardiovascular: Negative.  Negative for chest pain, palpitations and leg swelling.  Gastrointestinal: Negative.  Negative for nausea, vomiting, abdominal pain, diarrhea, constipation and blood in stool.  Endocrine: Negative.  Negative for polydipsia, polyphagia and polyuria.  Genitourinary: Negative.  Negative for difficulty urinating.  Musculoskeletal: Negative.  Negative for myalgias, back pain, joint swelling and arthralgias.  Skin: Negative.  Negative for rash.  Allergic/Immunologic: Negative.   Neurological: Negative.   Hematological: Negative.  Negative for adenopathy. Does not bruise/bleed easily.  Psychiatric/Behavioral: Negative.     Objective:  BP 126/80 mmHg  Pulse 97  Temp(Src) 97.8 F (36.6 C) (Oral)  Resp 20  Ht 5\' 2"  (1.575 m)  Wt 124 lb 4 oz (56.359 kg)  BMI 22.72 kg/m2  SpO2 97%  BP Readings from Last 3 Encounters:  12/05/14 126/80  06/19/14 128/68  01/29/14 140/88    Wt Readings from Last 3 Encounters:  12/05/14 124 lb 4 oz (56.359 kg)  06/19/14 122 lb (55.339 kg)  01/29/14 129 lb (58.514 kg)    Physical Exam  Constitutional: She is oriented to person, place, and time. No distress.  HENT:  Head: Normocephalic and atraumatic.  Mouth/Throat: Oropharynx is clear and  moist. No oropharyngeal exudate.  Eyes: Conjunctivae are normal. Right eye exhibits no discharge. Left eye exhibits no discharge. No scleral icterus.  Neck: Normal range of motion. Neck supple. No JVD present. No tracheal deviation present. No thyromegaly present.  Cardiovascular: Normal rate, regular rhythm, normal heart sounds and intact distal pulses.  Exam reveals no gallop and no friction rub.   No murmur heard. Pulmonary/Chest: Effort normal and breath sounds normal. No stridor. No respiratory distress. She has no wheezes. She has no rales. She exhibits no tenderness.  Abdominal:  Soft. Bowel sounds are normal. She exhibits no distension and no mass. There is no tenderness. There is no rebound and no guarding.  Musculoskeletal: Normal range of motion. She exhibits no edema or tenderness.  Lymphadenopathy:    She has no cervical adenopathy.  Neurological: She is oriented to person, place, and time.  Skin: Skin is warm and dry. No rash noted. She is not diaphoretic. No erythema. No pallor.  Vitals reviewed.   Lab Results  Component Value Date   WBC 7.2 01/29/2014   HGB 14.6 01/29/2014   HCT 45.1 01/29/2014   PLT 260.0 01/29/2014   GLUCOSE 148* 12/05/2014   CHOL 162 12/05/2014   TRIG 204.0* 12/05/2014   HDL 47.30 12/05/2014   LDLDIRECT 86.0 12/05/2014   LDLCALC 70 07/26/2013   ALT 18 01/29/2014   AST 21 01/29/2014   NA 136 12/05/2014   K 3.9 12/05/2014   CL 99 12/05/2014   CREATININE 0.71 12/05/2014   BUN 10 12/05/2014   CO2 27 12/05/2014   TSH 1.89 01/29/2014   HGBA1C 6.2 12/05/2014   MICROALBUR 0.9 06/19/2014    No results found.  Assessment & Plan:   [name] was seen today for hyperlipidemia and diabetes.  Diagnoses and all orders for this visit:  Osteoporosis, senile- her vitamin D level is normal, she is due for a follow-up bone mineral density test. -     Basic metabolic panel; Future -     DG Bone Density; Future -     Vit D  25 hydroxy (rtn osteoporosis monitoring); Future  Type 2 diabetes mellitus with complication, without long-term current use of insulin ([place])- her blood sugars are well-controlled and her renal function is stable. -     Hemoglobin A1c; Future -     Lipid panel; Future -     Basic metabolic panel; Future -     Ambulatory referral to Ophthalmology  Hyperlipidemia with target LDL less than 100- her triglycerides are slightly elevated but she is also not fasting today. She agrees to reduce her intake of fat. Her LDL is well-controlled. -     Lipid panel; Future  Encounter for immunization  Other orders -     Flu  Vaccine QUAD 36+ mos IM   I have discontinued Ms. Pytel etodolac and aspirin. I am also having her maintain her Calcium Carbonate-Vitamin D (CALCIUM + D PO), fish oil-omega-3 fatty acids, Cholecalciferol (VITAMIN D PO), omeprazole, ammonium lactate, traZODone, metFORMIN, simvastatin, FLUoxetine, amphetamine-dextroamphetamine, glucose blood, and buPROPion.  Meds ordered this encounter  Medications  . buPROPion (WELLBUTRIN XL) 150 MG 24 hr tablet    Sig: Take 150 mg by mouth every morning.    Refill:  4     Follow-up: Return in about 4 months (around 04/04/2015).  Scarlette Calico, MD

## 2014-12-18 ENCOUNTER — Encounter: Payer: Self-pay | Admitting: Internal Medicine

## 2014-12-25 ENCOUNTER — Other Ambulatory Visit: Payer: Self-pay | Admitting: Internal Medicine

## 2015-01-03 LAB — HM DIABETES EYE EXAM

## 2015-01-04 ENCOUNTER — Encounter: Payer: Self-pay | Admitting: Internal Medicine

## 2015-01-05 ENCOUNTER — Other Ambulatory Visit: Payer: Self-pay | Admitting: Internal Medicine

## 2015-01-05 DIAGNOSIS — M810 Age-related osteoporosis without current pathological fracture: Secondary | ICD-10-CM

## 2015-01-08 ENCOUNTER — Encounter: Payer: Self-pay | Admitting: Internal Medicine

## 2015-01-15 LAB — HM DEXA SCAN: HM DEXA SCAN: -2.5

## 2015-01-17 ENCOUNTER — Encounter: Payer: Self-pay | Admitting: Internal Medicine

## 2015-01-18 ENCOUNTER — Encounter: Payer: Self-pay | Admitting: Internal Medicine

## 2015-01-18 ENCOUNTER — Other Ambulatory Visit: Payer: Self-pay | Admitting: Internal Medicine

## 2015-01-18 DIAGNOSIS — M810 Age-related osteoporosis without current pathological fracture: Secondary | ICD-10-CM

## 2015-02-04 ENCOUNTER — Encounter: Payer: Self-pay | Admitting: Endocrinology

## 2015-02-04 ENCOUNTER — Ambulatory Visit (INDEPENDENT_AMBULATORY_CARE_PROVIDER_SITE_OTHER): Payer: Medicare Other | Admitting: Endocrinology

## 2015-02-04 VITALS — BP 122/84 | HR 79 | Temp 97.7°F | Ht 62.0 in | Wt 121.0 lb

## 2015-02-04 DIAGNOSIS — M810 Age-related osteoporosis without current pathological fracture: Secondary | ICD-10-CM | POA: Diagnosis not present

## 2015-02-04 LAB — BASIC METABOLIC PANEL
BUN: 12 mg/dL (ref 6–23)
CALCIUM: 10.2 mg/dL (ref 8.4–10.5)
CO2: 29 mEq/L (ref 19–32)
Chloride: 100 mEq/L (ref 96–112)
Creatinine, Ser: 0.72 mg/dL (ref 0.40–1.20)
GFR: 85.77 mL/min (ref >60.00)
GLUCOSE: 106 mg/dL — AB (ref 70–99)
Potassium: 3.9 mEq/L (ref 3.5–5.1)
SODIUM: 139 meq/L (ref 135–145)

## 2015-02-04 NOTE — Patient Instructions (Addendum)
blood tests are requested for you today.  We'll let you know about the results.  Based on the results, I hope to order for you a once a year infusion to help the bones. Please return in 1 year.  It is critically important to prevent falling down (keep floor areas well-lit, dry, and free of loose objects.  If you have a cane, walker, or wheelchair, you should use it, even for short trips around the house.  Also, try not to rush).      Osteoporosis Osteoporosis is the thinning and loss of density in the bones. Osteoporosis makes the bones more brittle, fragile, and likely to break (fracture). Over time, osteoporosis can cause the bones to become so weak that they fracture after a simple fall. The bones most likely to fracture are the bones in the hip, wrist, and spine. CAUSES  The exact cause is not known. RISK FACTORS Anyone can develop osteoporosis. You may be at greater risk if you have a family history of the condition or have poor nutrition. You may also have a higher risk if you are:   Female.   3 years old or older.  A smoker.  Not physically active.   White or Asian.  Slender. SIGNS AND SYMPTOMS  A fracture might be the first sign of the disease, especially if it results from a fall or injury that would not usually cause a bone to break. Other signs and symptoms include:   Low back and neck pain.  Stooped posture.  Height loss. DIAGNOSIS  To make a diagnosis, your health care provider may:  Take a medical history.  Perform a physical exam.  Order tests, such as:  A bone mineral density test.  A dual-energy X-ray absorptiometry test. TREATMENT  The goal of osteoporosis treatment is to strengthen your bones to reduce your risk of a fracture. Treatment may involve:  Making lifestyle changes, such as:  Eating a diet rich in calcium.  Doing weight-bearing and muscle-strengthening exercises.  Stopping tobacco use.  Limiting alcohol intake.  Taking  medicine to slow the process of bone loss or to increase bone density.  Monitoring your levels of calcium and vitamin D. HOME CARE INSTRUCTIONS  Include calcium and vitamin D in your diet. Calcium is important for bone health, and vitamin D helps the body absorb calcium.  Perform weight-bearing and muscle-strengthening exercises as directed by your health care provider.  Do not use any tobacco products, including cigarettes, chewing tobacco, and electronic cigarettes. If you need help quitting, ask your health care provider.  Limit your alcohol intake.  Take medicines only as directed by your health care provider.  Keep all follow-up visits as directed by your health care provider. This is important.  Take precautions at home to lower your risk of falling, such as:  Keeping rooms well lit and clutter free.  Installing safety rails on stairs.  Using rubber mats in the bathroom and other areas that are often wet or slippery. SEEK IMMEDIATE MEDICAL CARE IF:  You fall or injure yourself.    This information is not intended to replace advice given to you by your health care provider. Make sure you discuss any questions you have with your health care provider.   Document Released: 10/22/2004 Document Revised: 02/02/2014 Document Reviewed: 06/22/2013 Elsevier Interactive Patient Education [employer].

## 2015-02-04 NOTE — Progress Notes (Signed)
Subjective:    Patient ID: Jill Chandler, female    DOB: [birth year], 70 y.o.   MRN: [id number]  HPI Pt was noted to have osteoporosis in approx 2002.  She has never been on medication for this.  she has never had bony fracture.  She has no history of any of the following: early menopause, multiple myeloma, renal dz, thyroid problems, prolonged bedrest, steroids, alcoholism, smoking, liver dz, or primary hyperparathyroidism.  She does not take heparin or anticonvulsants.  She has moderate pain at the right hip, but no assoc falls.  She takes OTC vit-D and Ca++ supplements Past Medical History  Diagnosis Date  . Hyperlipidemia   . GERD (gastroesophageal reflux disease)   . Depression   . Anemia   . ADHD (attention deficit hyperactivity disorder)   . Headache(784.0)   . Personal history of colonic adenoma 06/14/2006  . Barrett's esophagus 03/01/2013  . Fibroid   . Diabetes mellitus without complication ([facility])   . VIN III (vulvar intraepithelial neoplasia III)     1990's per patient  . 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 Cervical dysplasia     Past Surgical History  Procedure Laterality Date  . Cervical fusion    . Cervical laminectomy    . Colonoscopy    . Vulvectomy partial    . Breast surgery      BREAST REDUCTION   . Leep      Cervix at younger age for dysplasia    Social History   Social History  . Marital Status: Single    Spouse Name: N/A  . Number of Children: N/A  . Years of Education: N/A   Occupational History  . Not on file.   Social History Main Topics  . Smoking status: Former Smoker    Quit date: 09/07/1998  . Smokeless tobacco: Never Used  . Alcohol Use: Yes     Comment: RARE  . Drug Use: No  . Sexual Activity: Yes    Birth Control/ Protection: Post-menopausal   Other Topics Concern  . Not on file   Social History Narrative   The patient is divorced, she is a  mental health and addiction therapist. No children. 3 caffeinated beverages daily.          Current Outpatient Prescriptions on File Prior to Visit  Medication Sig Dispense Refill  . ammonium lactate (LAC-HYDRIN) 12 % cream Apply topically as needed for dry skin. 385 g 0  . amphetamine-dextroamphetamine (ADDERALL) 20 MG tablet Take 1 tablet (20 mg total) by mouth 2 (two) times daily. 180 tablet 0  . buPROPion (WELLBUTRIN XL) 150 MG 24 hr tablet Take 150 mg by mouth every morning.  4  . Calcium Carbonate-Vitamin D (CALCIUM + D PO) Take by mouth.      . Cholecalciferol (VITAMIN D PO) Take by mouth.      . fish oil-omega-3 fatty acids 1000 MG capsule Take 2 g by mouth daily.      Marland Kitchen FLUoxetine (PROZAC) 40 MG capsule TAKE 1 BY MOUTH TWICE DAILY 180 capsule 3  . glucose blood (ONETOUCH VERIO) test strip Use as instructed to check blood sugar DX:E11.8 200 each 3  . metFORMIN (GLUCOPHAGE-XR) 750 MG 24 hr tablet Take 1 tablet (750 mg total) by mouth daily with breakfast. 90 tablet 3  . omeprazole (PRILOSEC) 20 MG capsule  TAKE 1 BY MOUTH DAILY AS NEEDED 90 capsule 3  . simvastatin (ZOCOR) 40 MG tablet TAKE 1 BY MOUTH AT BEDTIME 90 tablet 3  . traZODone (DESYREL) 150 MG tablet TAKE 1 BY MOUTH AT BEDTIME AS NEEDED FOR SLEEP 90 tablet 3   No current facility-administered medications on file prior to visit.    No Known Allergies  Family History  Problem Relation Age of Onset  . Alcohol abuse Other   . Drug abuse Other   . Hypertension Other   . Hyperlipidemia Other   . Cancer Neg Hx   . Early death Neg Hx   . Hearing loss Neg Hx   . Kidney disease Neg Hx   . Learning disabilities Neg Hx   . Stroke Neg Hx   . Heart disease Mother   . Breast cancer Sister 53  . Diabetes Sister   . Hypertension Sister   . Breast cancer Sister 31  . Osteoporosis Sister     BP 122/84 mmHg  Pulse 79  Temp(Src) 97.7 F (36.5 C) (Oral)  Ht '5\' 2"'  (1.575 m)  Wt 121 lb (54.885 kg)  BMI 22.13 kg/m2  SpO2  97%  Review of Systems denies weight loss, hematuria, n/v, cold intolerance, edema, skin rash, cramps, memory loss, back pain, easy bruising, and rhinorrhea.  No change in chronic insomnia.  GERD is well-controlled on prilosec.      Objective:   Physical Exam VS: see vs page GEN: no distress HEAD: head: no deformity eyes: no periorbital swelling, no proptosis external nose and ears are normal mouth: no lesion seen NECK: supple, thyroid is not enlarged;  CHEST WALL: no deformity.  No kyphosis LUNGS:  Clear to auscultation CV: reg rate and rhythm, no murmur ABD: abdomen is soft, nontender.  no hepatosplenomegaly.  not distended.  no hernia MUSCULOSKELETAL: muscle bulk and strength are grossly normal.  no obvious joint swelling.  gait is normal and steady EXTEMITIES: no deformity.  no edema.  PULSES: no carotid bruit.  NEURO:  cn 2-12 grossly intact.   readily moves all 4's.  sensation is intact to touch on all 4's.   SKIN:  Normal texture and temperature.  No rash or suspicious lesion is visible.   NODES:  None palpable at the neck. PSYCH: alert, well-oriented.  Does not appear anxious nor depressed.    Lab Results  Component Value Date   CALCIUM 9.6 12/05/2014   Lab Results  Component Value Date   CREATININE 0.71 12/05/2014   BUN 10 12/05/2014   NA 136 12/05/2014   K 3.9 12/05/2014   CL 99 12/05/2014   CO2 27 12/05/2014    25-OH vit-D=72  DEXA result is reviewed.  I have reviewed outside records, and summarized: Pt was noted to have osteoporosis, and referred here.     Assessment & Plan:  Osteoporosis, new to me.   GERD: this is a relative contraindication to fosamax.  Insomnia: this increases fall risk.     Patient is advised the following: Patient Instructions  blood tests are requested for you today.  We'll let you know about the results.  Based on the results, I hope to order for you a once a year infusion to help the bones. Please return in 1 year.  It  is critically important to prevent falling down (keep floor areas well-lit, dry, and free of loose objects.  If you have a cane, walker, or wheelchair, you should use it, even for short trips around the  house.  Also, try not to rush).      Osteoporosis Osteoporosis is the thinning and loss of density in the bones. Osteoporosis makes the bones more brittle, fragile, and likely to break (fracture). Over time, osteoporosis can cause the bones to become so weak that they fracture after a simple fall. The bones most likely to fracture are the bones in the hip, wrist, and spine. CAUSES  The exact cause is not known. RISK FACTORS Anyone can develop osteoporosis. You may be at greater risk if you have a family history of the condition or have poor nutrition. You may also have a higher risk if you are:   Female.   20 years old or older.  A smoker.  Not physically active.   White or Asian.  Slender. SIGNS AND SYMPTOMS  A fracture might be the first sign of the disease, especially if it results from a fall or injury that would not usually cause a bone to break. Other signs and symptoms include:   Low back and neck pain.  Stooped posture.  Height loss. DIAGNOSIS  To make a diagnosis, your health care provider may:  Take a medical history.  Perform a physical exam.  Order tests, such as:  A bone mineral density test.  A dual-energy X-ray absorptiometry test. TREATMENT  The goal of osteoporosis treatment is to strengthen your bones to reduce your risk of a fracture. Treatment may involve:  Making lifestyle changes, such as:  Eating a diet rich in calcium.  Doing weight-bearing and muscle-strengthening exercises.  Stopping tobacco use.  Limiting alcohol intake.  Taking medicine to slow the process of bone loss or to increase bone density.  Monitoring your levels of calcium and vitamin D. HOME CARE INSTRUCTIONS  Include calcium and vitamin D in your diet. Calcium is  important for bone health, and vitamin D helps the body absorb calcium.  Perform weight-bearing and muscle-strengthening exercises as directed by your health care provider.  Do not use any tobacco products, including cigarettes, chewing tobacco, and electronic cigarettes. If you need help quitting, ask your health care provider.  Limit your alcohol intake.  Take medicines only as directed by your health care provider.  Keep all follow-up visits as directed by your health care provider. This is important.  Take precautions at home to lower your risk of falling, such as:  Keeping rooms well lit and clutter free.  Installing safety rails on stairs.  Using rubber mats in the bathroom and other areas that are often wet or slippery. SEEK IMMEDIATE MEDICAL CARE IF:  You fall or injure yourself.    This information is not intended to replace advice given to you by your health care provider. Make sure you discuss any questions you have with your health care provider.   Document Released: 10/22/2004 Document Revised: 02/02/2014 Document Reviewed: 06/22/2013 Elsevier Interactive Patient Education [employer].

## 2015-02-05 LAB — PTH, INTACT AND CALCIUM
Calcium: 10.2 mg/dL (ref 8.4–10.5)
PTH: 35 pg/mL (ref 14–64)

## 2015-02-07 LAB — PROTEIN ELECTROPHORESIS, SERUM
ALPHA-1-GLOBULIN: 0.3 g/dL (ref 0.2–0.3)
ALPHA-2-GLOBULIN: 0.7 g/dL (ref 0.5–0.9)
Albumin ELP: 4.5 g/dL (ref 3.8–4.8)
BETA 2: 0.3 g/dL (ref 0.2–0.5)
BETA GLOBULIN: 0.5 g/dL (ref 0.4–0.6)
GAMMA GLOBULIN: 0.7 g/dL — AB (ref 0.8–1.7)
Total Protein, Serum Electrophoresis: 7 g/dL (ref 6.1–8.1)

## 2015-02-08 ENCOUNTER — Encounter: Payer: Self-pay | Admitting: Endocrinology

## 2015-02-18 ENCOUNTER — Encounter: Payer: Self-pay | Admitting: Endocrinology

## 2015-02-26 ENCOUNTER — Ambulatory Visit (INDEPENDENT_AMBULATORY_CARE_PROVIDER_SITE_OTHER): Payer: Medicare Other | Admitting: Nutrition

## 2015-02-26 ENCOUNTER — Encounter: Payer: Medicare Other | Admitting: Nutrition

## 2015-02-26 DIAGNOSIS — M810 Age-related osteoporosis without current pathological fracture: Secondary | ICD-10-CM

## 2015-03-01 ENCOUNTER — Telehealth: Payer: Self-pay | Admitting: Endocrinology

## 2015-03-01 NOTE — Telephone Encounter (Signed)
Please offer ov now.  

## 2015-03-01 NOTE — Telephone Encounter (Signed)
Patient called stating that she is feeling really bad. Today is the first day out of bed since her reclast infusion   Symptoms:  Achy  Eyes hurt  Confusion   Please advise

## 2015-03-01 NOTE — Telephone Encounter (Signed)
Pt returned your call, she said to let you know if she doesn't feel better by the weekend she will go in.

## 2015-03-01 NOTE — Telephone Encounter (Signed)
I attempted to reach the pt on her home and cell number and could not reach her. Per Dr. Loanne Drilling pt was advised she could be seen by the urgent clinic at [facility] on Saturday or call on Monday to been evaluated in our office.

## 2015-03-01 NOTE — Telephone Encounter (Signed)
See note below and please advise, Thanks! 

## 2015-03-01 NOTE — Telephone Encounter (Signed)
Attempted to reach the pt. Pt was unavailable will try again at a later time.  

## 2015-03-02 ENCOUNTER — Encounter: Payer: Self-pay | Admitting: Endocrinology

## 2015-03-05 NOTE — Patient Instructions (Signed)
Drink 6 8 ounce glasses of water today.   Take Tylenol for flu like symtoms

## 2015-03-05 NOTE — Progress Notes (Deleted)
Subjective:     Patient ID: Jill Chandler, female   DOB: [birth year], 70 y.o.   MRN: [id number]  HPI   Review of Systems     Objective:   Physical Exam     Assessment:     ***    Plan:     ***

## 2015-03-05 NOTE — Telephone Encounter (Signed)
I contacted the pt and advised the one of her my chart messages has note been viewed. Pt was advise to contact the mychart help desk to help with getting this sorted but declined the number at this time.

## 2015-03-05 NOTE — Progress Notes (Signed)
Zoledronic Acid injection 5mg /130ml was administered into the L forarm with an 20g needle.  She reported no discomfort or side effects during the infusion. After 25 min., the infusion was finished and the needle removed.  Site showed no signes of redness or swelling.  Pt. Was told to drink plenty of fluids today and tomorrow.  We reviewed the side effects and she was encouraged to continue her calcium and Vitamin D.  She had no final questions.

## 2015-03-05 NOTE — Progress Notes (Deleted)
Subjective:     Patient ID: Jill Chandler, female   DOB: 10-26-1945, 70 y.o.   MRN: [id number]  HPI   Review of Systems     Objective:   Physical Exam     Assessment:     ***    Plan:     ***

## 2015-03-12 ENCOUNTER — Other Ambulatory Visit: Payer: Self-pay | Admitting: Internal Medicine

## 2015-03-12 ENCOUNTER — Encounter: Payer: Self-pay | Admitting: Internal Medicine

## 2015-03-12 DIAGNOSIS — F988 Other specified behavioral and emotional disorders with onset usually occurring in childhood and adolescence: Secondary | ICD-10-CM

## 2015-03-12 MED ORDER — AMPHETAMINE-DEXTROAMPHETAMINE 20 MG PO TABS: 20.0000 mg | ORAL_TABLET | Freq: Two times a day (BID) | ORAL | Status: DC

## 2015-04-30 ENCOUNTER — Encounter: Payer: Self-pay | Admitting: Internal Medicine

## 2015-04-30 ENCOUNTER — Other Ambulatory Visit (INDEPENDENT_AMBULATORY_CARE_PROVIDER_SITE_OTHER): Payer: Medicare Other

## 2015-04-30 ENCOUNTER — Ambulatory Visit (INDEPENDENT_AMBULATORY_CARE_PROVIDER_SITE_OTHER): Payer: Medicare Other | Admitting: Internal Medicine

## 2015-04-30 VITALS — BP 120/80 | HR 98 | Temp 98.3°F | Resp 16 | Ht 62.0 in | Wt 119.0 lb

## 2015-04-30 DIAGNOSIS — K219 Gastro-esophageal reflux disease without esophagitis: Secondary | ICD-10-CM

## 2015-04-30 DIAGNOSIS — E118 Type 2 diabetes mellitus with unspecified complications: Secondary | ICD-10-CM

## 2015-04-30 LAB — CBC WITH DIFFERENTIAL/PLATELET
BASOS PCT: 0.7 % (ref 0.0–3.0)
Basophils Absolute: 0.1 10*3/uL (ref 0.0–0.1)
EOS PCT: 2.2 % (ref 0.0–5.0)
Eosinophils Absolute: 0.2 10*3/uL (ref 0.0–0.7)
HEMATOCRIT: 38.8 % (ref 36.0–46.0)
HEMOGLOBIN: 12.7 g/dL (ref 12.0–15.0)
LYMPHS PCT: 20.9 % (ref 12.0–46.0)
Lymphs Abs: 1.6 10*3/uL (ref 0.7–4.0)
MCHC: 32.6 g/dL (ref 30.0–36.0)
MCV: 73.8 fl — ABNORMAL LOW (ref 78.0–100.0)
Monocytes Absolute: 0.7 10*3/uL (ref 0.1–1.0)
Monocytes Relative: 9.4 % (ref 3.0–12.0)
Neutro Abs: 5.3 10*3/uL (ref 1.4–7.7)
Neutrophils Relative %: 66.8 % (ref 43.0–77.0)
Platelets: 291 10*3/uL (ref 150.0–400.0)
RBC: 5.26 Mil/uL — ABNORMAL HIGH (ref 3.87–5.11)
RDW: 17.3 % — AB (ref 11.5–15.5)
WBC: 7.9 10*3/uL (ref 4.0–10.5)

## 2015-04-30 LAB — MICROALBUMIN / CREATININE URINE RATIO
Creatinine,U: 72.4 mg/dL
MICROALB UR: 1.6 mg/dL (ref 0.0–1.9)
Microalb Creat Ratio: 2.2 mg/g (ref 0.0–30.0)

## 2015-04-30 LAB — URINALYSIS, ROUTINE W REFLEX MICROSCOPIC
Bilirubin Urine: NEGATIVE
HGB URINE DIPSTICK: NEGATIVE
Ketones, ur: NEGATIVE
Nitrite: NEGATIVE
Specific Gravity, Urine: 1.015 (ref 1.000–1.030)
Total Protein, Urine: NEGATIVE
URINE GLUCOSE: NEGATIVE
Urobilinogen, UA: 0.2 (ref 0.0–1.0)
pH: 5.5 (ref 5.0–8.0)

## 2015-04-30 LAB — BASIC METABOLIC PANEL
BUN: 17 mg/dL (ref 6–23)
CALCIUM: 9.8 mg/dL (ref 8.4–10.5)
CO2: 30 mEq/L (ref 19–32)
CREATININE: 0.69 mg/dL (ref 0.40–1.20)
Chloride: 102 mEq/L (ref 96–112)
GFR: 90.02 mL/min (ref >60.00)
GLUCOSE: 101 mg/dL — AB (ref 70–99)
Potassium: 3.9 mEq/L (ref 3.5–5.1)
Sodium: 137 mEq/L (ref 135–145)

## 2015-04-30 LAB — HEMOGLOBIN A1C: Hgb A1c MFr Bld: 6.4 % (ref 4.6–6.5)

## 2015-04-30 NOTE — Progress Notes (Signed)
Subjective:  Patient ID: Jill Chandler, female    DOB: 11/27/1945  Age: 70 y.o. MRN: [id number]  CC: Diabetes   HPI Jill Chandler presents for follow-up on diabetes mellitus. She feels well and offers no complaints. Her blood sugars been well controlled and she denies visual disturbance, polyuria, polydipsia, or polyphagia.  Pt states ADD status overall stable on current meds with overall good compliance and tolerability, and good effectiveness with respect to ability for concentration and task completion.  Outpatient Prescriptions Prior to Visit  Medication Sig Dispense Refill  . ammonium lactate (LAC-HYDRIN) 12 % cream Apply topically as needed for dry skin. 385 g 0  . amphetamine-dextroamphetamine (ADDERALL) 20 MG tablet Take 1 tablet (20 mg total) by mouth 2 (two) times daily. 180 tablet 0  . buPROPion (WELLBUTRIN XL) 150 MG 24 hr tablet Take 150 mg by mouth every morning.  4  . Calcium Carbonate-Vitamin D (CALCIUM + D PO) Take by mouth.      . Cholecalciferol (VITAMIN D PO) Take by mouth.      . fish oil-omega-3 fatty acids 1000 MG capsule Take 2 g by mouth daily.      Marland Kitchen FLUoxetine (PROZAC) 40 MG capsule TAKE 1 BY MOUTH TWICE DAILY 180 capsule 3  . glucose blood (ONETOUCH VERIO) test strip Use as instructed to check blood sugar DX:E11.8 200 each 3  . metFORMIN (GLUCOPHAGE-XR) 750 MG 24 hr tablet Take 1 tablet (750 mg total) by mouth daily with breakfast. 90 tablet 3  . omeprazole (PRILOSEC) 20 MG capsule TAKE 1 BY MOUTH DAILY AS NEEDED 90 capsule 3  . simvastatin (ZOCOR) 40 MG tablet TAKE 1 BY MOUTH AT BEDTIME 90 tablet 3  . traZODone (DESYREL) 150 MG tablet TAKE 1 BY MOUTH AT BEDTIME AS NEEDED FOR SLEEP 90 tablet 3   No facility-administered medications prior to visit.    ROS Review of Systems  Constitutional: Negative.  Negative for fever, chills, diaphoresis and fatigue.  HENT: Negative.   Eyes: Negative.  Negative for visual disturbance.  Respiratory: Negative.  Negative  for cough, choking, chest tightness, shortness of breath and stridor.   Cardiovascular: Negative.  Negative for chest pain, palpitations and leg swelling.  Gastrointestinal: Negative.  Negative for nausea, vomiting, abdominal pain, diarrhea, constipation and blood in stool.  Endocrine: Negative.  Negative for polydipsia, polyphagia and polyuria.  Genitourinary: Negative.   Musculoskeletal: Negative.  Negative for myalgias, back pain, arthralgias and neck pain.  Skin: Negative.  Negative for color change and rash.  Allergic/Immunologic: Negative.   Neurological: Negative.  Negative for dizziness, tremors, seizures, weakness, numbness and headaches.  Hematological: Negative.  Negative for adenopathy. Does not bruise/bleed easily.  Psychiatric/Behavioral: Negative.     Objective:  BP 120/80 mmHg  Pulse 98  Temp(Src) 98.3 F (36.8 C) (Oral)  Resp 16  Ht 5\' 2"  (1.575 m)  Wt 119 lb (53.978 kg)  BMI 21.76 kg/m2  SpO2 96%  BP Readings from Last 3 Encounters:  04/30/15 120/80  02/04/15 122/84  12/05/14 126/80    Wt Readings from Last 3 Encounters:  04/30/15 119 lb (53.978 kg)  02/04/15 121 lb (54.885 kg)  12/05/14 124 lb 4 oz (56.359 kg)    Physical Exam  Constitutional: She is oriented to person, place, and time. No distress.  HENT:  Mouth/Throat: Oropharynx is clear and moist. No oropharyngeal exudate.  Eyes: Conjunctivae are normal. Right eye exhibits no discharge. Left eye exhibits no discharge. No scleral icterus.  Neck:  Normal range of motion. Neck supple. No JVD present. No tracheal deviation present. No thyromegaly present.  Cardiovascular: Normal rate, regular rhythm, normal heart sounds and intact distal pulses.  Exam reveals no gallop and no friction rub.   No murmur heard. Pulmonary/Chest: Effort normal and breath sounds normal. No stridor. No respiratory distress. She has no wheezes. She has no rales. She exhibits no tenderness.  Abdominal: Soft. Bowel sounds are  normal. She exhibits no distension and no mass. There is no tenderness. There is no rebound and no guarding.  Musculoskeletal: Normal range of motion. She exhibits no edema or tenderness.  Lymphadenopathy:    She has no cervical adenopathy.  Neurological: She is oriented to person, place, and time.  Skin: Skin is warm and dry. No rash noted. She is not diaphoretic. No erythema. No pallor.  Vitals reviewed.   Lab Results  Component Value Date   WBC 7.9 04/30/2015   HGB 12.7 04/30/2015   HCT 38.8 04/30/2015   PLT 291.0 04/30/2015   GLUCOSE 101* 04/30/2015   CHOL 162 12/05/2014   TRIG 204.0* 12/05/2014   HDL 47.30 12/05/2014   LDLDIRECT 86.0 12/05/2014   LDLCALC 70 07/26/2013   ALT 18 01/29/2014   AST 21 01/29/2014   NA 137 04/30/2015   K 3.9 04/30/2015   CL 102 04/30/2015   CREATININE 0.69 04/30/2015   BUN 17 04/30/2015   CO2 30 04/30/2015   TSH 1.89 01/29/2014   HGBA1C 6.4 04/30/2015   MICROALBUR 1.6 04/30/2015    No results found.  Assessment & Plan:   Jill Chandler was seen today for diabetes.  Diagnoses and all orders for this visit:  Type 2 diabetes mellitus with complication, without long-term current use of insulin ([place])- her A1c is 6.4%, blood sugars are well controlled, renal function is stable. -     Basic metabolic panel; Future -     Hemoglobin A1c; Future -     Urinalysis, Routine w reflex microscopic (not at [facility]); Future -     Microalbumin / creatinine urine ratio; Future  Gastroesophageal reflux disease without esophagitis- her symptoms are well controlled and there is no evidence of anemia on her CBC. -     CBC with Differential/Platelet; Future   I am having Jill Chandler maintain her Calcium Carbonate-Vitamin D (CALCIUM + D PO), fish oil-omega-3 fatty acids, Cholecalciferol (VITAMIN D PO), ammonium lactate, metFORMIN, simvastatin, FLUoxetine, buPROPion, glucose blood, traZODone, omeprazole, and amphetamine-dextroamphetamine.  No orders of the defined types  were placed in this encounter.     Follow-up: Return in about 6 months (around 10/30/2015).  Jill Calico, MD

## 2015-04-30 NOTE — Progress Notes (Signed)
Pre visit review using our clinic review tool, if applicable. No additional management support is needed unless otherwise documented below in the visit note. 

## 2015-04-30 NOTE — Patient Instructions (Signed)

## 2015-05-01 ENCOUNTER — Encounter: Payer: Self-pay | Admitting: Internal Medicine

## 2015-06-18 ENCOUNTER — Other Ambulatory Visit: Payer: Self-pay | Admitting: Internal Medicine

## 2015-06-18 ENCOUNTER — Encounter: Payer: Self-pay | Admitting: Internal Medicine

## 2015-06-18 DIAGNOSIS — F988 Other specified behavioral and emotional disorders with onset usually occurring in childhood and adolescence: Secondary | ICD-10-CM

## 2015-06-18 MED ORDER — AMPHETAMINE-DEXTROAMPHETAMINE 20 MG PO TABS: 20.0000 mg | ORAL_TABLET | Freq: Two times a day (BID) | ORAL | Status: DC

## 2015-06-18 NOTE — Progress Notes (Signed)
Place script up front for pick-up.../lmb 

## 2015-06-28 ENCOUNTER — Encounter: Payer: Self-pay | Admitting: Internal Medicine

## 2015-06-28 DIAGNOSIS — E118 Type 2 diabetes mellitus with unspecified complications: Secondary | ICD-10-CM

## 2015-06-28 MED ORDER — SIMVASTATIN 40 MG PO TABS: ORAL_TABLET | ORAL | Status: DC

## 2015-06-28 MED ORDER — METFORMIN HCL ER 750 MG PO TB24: 750.0000 mg | ORAL_TABLET | Freq: Every day | ORAL | Status: DC

## 2015-08-12 ENCOUNTER — Ambulatory Visit (INDEPENDENT_AMBULATORY_CARE_PROVIDER_SITE_OTHER)
Admission: RE | Admit: 2015-08-12 | Discharge: 2015-08-12 | Disposition: A | Discharge status: Home / Self Care | Payer: Medicare Other | Source: Ambulatory Visit | Attending: Internal Medicine | Admitting: Internal Medicine

## 2015-08-12 ENCOUNTER — Encounter: Payer: Self-pay | Admitting: Internal Medicine

## 2015-08-12 ENCOUNTER — Ambulatory Visit (INDEPENDENT_AMBULATORY_CARE_PROVIDER_SITE_OTHER): Payer: Medicare Other | Admitting: Internal Medicine

## 2015-08-12 VITALS — BP 120/80 | HR 89 | Temp 98.0°F | Resp 16 | Ht 62.0 in | Wt 128.0 lb

## 2015-08-12 DIAGNOSIS — M25512 Pain in left shoulder: Secondary | ICD-10-CM

## 2015-08-12 DIAGNOSIS — M12512 Traumatic arthropathy, left shoulder: Secondary | ICD-10-CM | POA: Diagnosis not present

## 2015-08-12 DIAGNOSIS — M12819 Other specific arthropathies, not elsewhere classified, unspecified shoulder: Secondary | ICD-10-CM

## 2015-08-12 DIAGNOSIS — M12812 Other specific arthropathies, not elsewhere classified, left shoulder: Secondary | ICD-10-CM

## 2015-08-12 HISTORY — DX: Other specific arthropathies, not elsewhere classified, unspecified shoulder: M12.819

## 2015-08-12 MED ORDER — ETODOLAC 200 MG PO CAPS: 200.0000 mg | ORAL_CAPSULE | Freq: Two times a day (BID) | ORAL | Status: DC

## 2015-08-12 NOTE — Patient Instructions (Signed)

## 2015-08-12 NOTE — Progress Notes (Signed)
Pre visit review using our clinic review tool, if applicable. No additional management support is needed unless otherwise documented below in the visit note. 

## 2015-08-12 NOTE — Progress Notes (Signed)
Subjective:  Patient ID: Jill Chandler, female    DOB: 03/08/1945  Age: 70 y.o. MRN: [id number]  CC: Shoulder Pain   HPI Jill Chandler presents for a 2-3 week hx of worsening left shoulder pain and decreased ROM. She denies a recent trauma or injury. She has been taking a 70 year old Rx of etodolac for pain relief.  Outpatient Prescriptions Prior to Visit  Medication Sig Dispense Refill  . ammonium lactate (LAC-HYDRIN) 12 % cream Apply topically as needed for dry skin. 385 g 0  . amphetamine-dextroamphetamine (ADDERALL) 20 MG tablet Take 1 tablet (20 mg total) by mouth 2 (two) times daily. 180 tablet 0  . buPROPion (WELLBUTRIN XL) 150 MG 24 hr tablet Take 150 mg by mouth every morning.  4  . Calcium Carbonate-Vitamin D (CALCIUM + D PO) Take by mouth.      . Cholecalciferol (VITAMIN D PO) Take by mouth.      . fish oil-omega-3 fatty acids 1000 MG capsule Take 2 g by mouth daily.      Marland Kitchen FLUoxetine (PROZAC) 40 MG capsule TAKE 1 BY MOUTH TWICE DAILY 180 capsule 3  . glucose blood (ONETOUCH VERIO) test strip Use as instructed to check blood sugar DX:E11.8 200 each 3  . metFORMIN (GLUCOPHAGE-XR) 750 MG 24 hr tablet Take 1 tablet (750 mg total) by mouth daily with breakfast. 90 tablet 3  . omeprazole (PRILOSEC) 20 MG capsule TAKE 1 BY MOUTH DAILY AS NEEDED 90 capsule 3  . simvastatin (ZOCOR) 40 MG tablet TAKE 1 BY MOUTH AT BEDTIME 90 tablet 3  . traZODone (DESYREL) 150 MG tablet TAKE 1 BY MOUTH AT BEDTIME AS NEEDED FOR SLEEP 90 tablet 3   No facility-administered medications prior to visit.    ROS Review of Systems  Constitutional: Negative.  Negative for fever, chills and fatigue.  HENT: Negative.   Eyes: Negative.  Negative for visual disturbance.  Respiratory: Negative.  Negative for cough, choking, shortness of breath and stridor.   Cardiovascular: Negative.  Negative for chest pain, palpitations and leg swelling.  Gastrointestinal: Negative.  Negative for abdominal pain.  Endocrine:  Negative.   Genitourinary: Negative.   Musculoskeletal: Positive for arthralgias and neck pain. Negative for back pain, joint swelling and gait problem.  Skin: Negative.   Allergic/Immunologic: Negative.   Neurological: Negative.  Negative for dizziness, weakness, light-headedness, numbness and headaches.  Hematological: Negative.  Negative for adenopathy. Does not bruise/bleed easily.  Psychiatric/Behavioral: Negative.     Objective:  BP 120/80 mmHg  Pulse 89  Temp(Src) 98 F (36.7 C) (Oral)  Resp 16  Ht 5\' 2"  (1.575 m)  Wt 128 lb (58.06 kg)  BMI 23.41 kg/m2  SpO2 96%  BP Readings from Last 3 Encounters:  08/12/15 120/80  04/30/15 120/80  02/04/15 122/84    Wt Readings from Last 3 Encounters:  08/12/15 128 lb (58.06 kg)  04/30/15 119 lb (53.978 kg)  02/04/15 121 lb (54.885 kg)    Physical Exam  Constitutional: She is oriented to person, place, and time. No distress.  HENT:  Mouth/Throat: Oropharynx is clear and moist. No oropharyngeal exudate.  Eyes: Conjunctivae are normal. Right eye exhibits no discharge. Left eye exhibits no discharge. No scleral icterus.  Neck: Normal range of motion. Neck supple. No JVD present. No tracheal deviation present. No thyromegaly present.  Cardiovascular: Normal rate, regular rhythm, normal heart sounds and intact distal pulses.  Exam reveals no gallop and no friction rub.   No murmur  heard. Pulmonary/Chest: Effort normal and breath sounds normal. No stridor. No respiratory distress. She has no wheezes. She has no rales. She exhibits no tenderness.  Abdominal: Soft. Bowel sounds are normal. She exhibits no distension and no mass. There is no tenderness. There is no rebound and no guarding.  Musculoskeletal: She exhibits no edema or tenderness.       Left shoulder: She exhibits decreased range of motion. She exhibits no tenderness, no bony tenderness, no swelling, no effusion, no crepitus, no deformity, no pain, no spasm and normal  strength.       Cervical back: Normal. She exhibits normal range of motion, no tenderness, no bony tenderness, no swelling, no edema and no deformity.  Lymphadenopathy:    She has no cervical adenopathy.  Neurological: She is oriented to person, place, and time.  Skin: Skin is warm and dry. No rash noted. She is not diaphoretic. No erythema. No pallor.  Vitals reviewed.   Lab Results  Component Value Date   WBC 7.9 04/30/2015   HGB 12.7 04/30/2015   HCT 38.8 04/30/2015   PLT 291.0 04/30/2015   GLUCOSE 101* 04/30/2015   CHOL 162 12/05/2014   TRIG 204.0* 12/05/2014   HDL 47.30 12/05/2014   LDLDIRECT 86.0 12/05/2014   LDLCALC 70 07/26/2013   ALT 18 01/29/2014   AST 21 01/29/2014   NA 137 04/30/2015   K 3.9 04/30/2015   CL 102 04/30/2015   CREATININE 0.69 04/30/2015   BUN 17 04/30/2015   CO2 30 04/30/2015   TSH 1.89 01/29/2014   HGBA1C 6.4 04/30/2015   MICROALBUR 1.6 04/30/2015    No results found.  Assessment & Plan:   [name] was seen today for shoulder pain.  Diagnoses and all orders for this visit:  Left shoulder pain- The plain film is normal, I am concerned about a degenerative rotator cuff issue so I have asked her to see orthopedic surgery. -     etodolac (LODINE) 200 MG capsule; Take 1 capsule (200 mg total) by mouth 2 (two) times daily. -     DG Shoulder Left; Future -     Ambulatory referral to Orthopedic Surgery  Rotator cuff arthropathy, left- she will continue Etodolac but I will give her a new prescription for this, I've asked her to see orthopedic surgery about this as well. -     etodolac (LODINE) 200 MG capsule; Take 1 capsule (200 mg total) by mouth 2 (two) times daily. -     DG Shoulder Left; Future -     Ambulatory referral to Orthopedic Surgery  I am having [name] start on etodolac. I am also having her maintain her Calcium Carbonate-Vitamin D (CALCIUM + D PO), fish oil-omega-3 fatty acids, Cholecalciferol (VITAMIN D PO), ammonium lactate,  FLUoxetine, buPROPion, glucose blood, traZODone, omeprazole, amphetamine-dextroamphetamine, simvastatin, and metFORMIN.  Meds ordered this encounter  Medications  . etodolac (LODINE) 200 MG capsule    Sig: Take 1 capsule (200 mg total) by mouth 2 (two) times daily.    Dispense:  60 capsule    Refill:  3     Follow-up: Return if symptoms worsen or fail to improve.  Scarlette Calico, MD

## 2015-08-13 ENCOUNTER — Encounter: Payer: Self-pay | Admitting: Internal Medicine

## 2015-08-18 ENCOUNTER — Encounter (HOSPITAL_COMMUNITY): Payer: Self-pay | Admitting: Emergency Medicine

## 2015-08-18 ENCOUNTER — Emergency Department (HOSPITAL_COMMUNITY)
Admission: EM | Admit: 2015-08-18 | Discharge: 2015-08-18 | Disposition: A | Discharge status: Home / Self Care | Payer: Medicare Other | Attending: Dermatology | Admitting: Dermatology

## 2015-08-18 ENCOUNTER — Emergency Department (HOSPITAL_COMMUNITY): Payer: Medicare Other

## 2015-08-18 DIAGNOSIS — Z5321 Procedure and treatment not carried out due to patient leaving prior to being seen by health care provider: Secondary | ICD-10-CM | POA: Diagnosis not present

## 2015-08-18 DIAGNOSIS — F329 Major depressive disorder, single episode, unspecified: Secondary | ICD-10-CM | POA: Diagnosis not present

## 2015-08-18 DIAGNOSIS — Z7984 Long term (current) use of oral hypoglycemic drugs: Secondary | ICD-10-CM | POA: Diagnosis not present

## 2015-08-18 DIAGNOSIS — Z87891 Personal history of nicotine dependence: Secondary | ICD-10-CM | POA: Insufficient documentation

## 2015-08-18 DIAGNOSIS — E785 Hyperlipidemia, unspecified: Secondary | ICD-10-CM | POA: Insufficient documentation

## 2015-08-18 DIAGNOSIS — R079 Chest pain, unspecified: Secondary | ICD-10-CM | POA: Diagnosis not present

## 2015-08-18 DIAGNOSIS — E119 Type 2 diabetes mellitus without complications: Secondary | ICD-10-CM | POA: Insufficient documentation

## 2015-08-18 DIAGNOSIS — F909 Attention-deficit hyperactivity disorder, unspecified type: Secondary | ICD-10-CM | POA: Insufficient documentation

## 2015-08-18 DIAGNOSIS — R0602 Shortness of breath: Secondary | ICD-10-CM | POA: Insufficient documentation

## 2015-08-18 LAB — I-STAT TROPONIN, ED: TROPONIN I, POC: 0 ng/mL (ref 0.00–0.08)

## 2015-08-18 LAB — CBC
HCT: 39.3 % (ref 36.0–46.0)
Hemoglobin: 12.4 g/dL (ref 12.0–15.0)
MCH: 24 pg — ABNORMAL LOW (ref 26.0–34.0)
MCHC: 31.6 g/dL (ref 30.0–36.0)
MCV: 76 fL — AB (ref 78.0–100.0)
PLATELETS: 267 10*3/uL (ref 150–400)
RBC: 5.17 MIL/uL — AB (ref 3.87–5.11)
RDW: 14.9 % (ref 11.5–15.5)
WBC: 7 10*3/uL (ref 4.0–10.5)

## 2015-08-18 LAB — BASIC METABOLIC PANEL
Anion gap: 7 (ref 5–15)
BUN: 12 mg/dL (ref 6–20)
CALCIUM: 9.5 mg/dL (ref 8.9–10.3)
CHLORIDE: 102 mmol/L (ref 101–111)
CO2: 29 mmol/L (ref 22–32)
CREATININE: 0.7 mg/dL (ref 0.44–1.00)
Glucose, Bld: 96 mg/dL (ref 65–99)
Potassium: 4 mmol/L (ref 3.5–5.1)
SODIUM: 138 mmol/L (ref 135–145)

## 2015-08-18 NOTE — ED Triage Notes (Signed)
Patient reports chest pressure and shortness of breath that woke her up at midnight. Patient reports the symptoms have subsided now, but wants to be checked out.

## 2015-08-18 NOTE — ED Notes (Signed)
Pt states she is leaving because of the wait time. Pt states she wasn't that bad to begin with,

## 2015-08-18 NOTE — ED Notes (Signed)
Pt reported to staff that she was leaving.

## 2015-08-19 ENCOUNTER — Encounter: Payer: Self-pay | Admitting: Internal Medicine

## 2015-08-19 ENCOUNTER — Ambulatory Visit (INDEPENDENT_AMBULATORY_CARE_PROVIDER_SITE_OTHER): Payer: Medicare Other | Admitting: Internal Medicine

## 2015-08-19 VITALS — BP 132/80 | HR 86 | Temp 98.0°F | Resp 16 | Wt 122.0 lb

## 2015-08-19 DIAGNOSIS — R0789 Other chest pain: Secondary | ICD-10-CM

## 2015-08-19 DIAGNOSIS — K2100 Gastro-esophageal reflux disease with esophagitis, without bleeding: Secondary | ICD-10-CM

## 2015-08-19 DIAGNOSIS — K2270 Barrett's esophagus without dysplasia: Secondary | ICD-10-CM

## 2015-08-19 MED ORDER — ESOMEPRAZOLE MAGNESIUM 40 MG PO CPDR: 40.0000 mg | DELAYED_RELEASE_CAPSULE | Freq: Every day | ORAL | 3 refills | Status: DC

## 2015-08-19 NOTE — Patient Instructions (Signed)

## 2015-08-19 NOTE — Progress Notes (Signed)
Pre visit review using our clinic review tool, if applicable. No additional management support is needed unless otherwise documented below in the visit note. 

## 2015-08-19 NOTE — Progress Notes (Signed)
Subjective:  Patient ID: Jill Chandler, female    DOB: 06-16-1945  Age: 70 y.o. MRN: [id number]  CC: Gastroesophageal Reflux and Chest Pain   HPI Jill Chandler presents for concerns about an episode of chest pain. 2 days ago she woke up during the night with diffuse tightness across her chest with shortness of breath. She was seen in emergency room and had a normal EKG, normal CXR, and a negative troponin. Her symptoms have somewhat improved. The symptoms were noted to start after she started taking an NSAID for shoulder pain. She has a history of hiatal hernia and Barrett's esophagus. She's had no recent episodes of odynophagia or dysphagia. She felt like the pain in the chest started in the upper stomach and then radiated up into her sternum. She did not experience diaphoresis, fatigue, neck pain, dizziness, near syncope, or palpitations.  Outpatient Medications Prior to Visit  Medication Sig Dispense Refill  . ammonium lactate (LAC-HYDRIN) 12 % cream Apply topically as needed for dry skin. 385 g 0  . amphetamine-dextroamphetamine (ADDERALL) 20 MG tablet Take 1 tablet (20 mg total) by mouth 2 (two) times daily. 180 tablet 0  . buPROPion (WELLBUTRIN XL) 150 MG 24 hr tablet Take 150 mg by mouth every morning.  4  . Calcium Carbonate-Vitamin D (CALCIUM + D PO) Take by mouth.      . Cholecalciferol (VITAMIN D PO) Take by mouth.      . etodolac (LODINE) 200 MG capsule Take 1 capsule (200 mg total) by mouth 2 (two) times daily. 60 capsule 3  . fish oil-omega-3 fatty acids 1000 MG capsule Take 2 g by mouth daily.      Marland Kitchen FLUoxetine (PROZAC) 40 MG capsule TAKE 1 BY MOUTH TWICE DAILY 180 capsule 3  . glucose blood (ONETOUCH VERIO) test strip Use as instructed to check blood sugar DX:E11.8 200 each 3  . metFORMIN (GLUCOPHAGE-XR) 750 MG 24 hr tablet Take 1 tablet (750 mg total) by mouth daily with breakfast. 90 tablet 3  . simvastatin (ZOCOR) 40 MG tablet TAKE 1 BY MOUTH AT BEDTIME 90 tablet 3  .  traZODone (DESYREL) 150 MG tablet TAKE 1 BY MOUTH AT BEDTIME AS NEEDED FOR SLEEP 90 tablet 3  . omeprazole (PRILOSEC) 20 MG capsule TAKE 1 BY MOUTH DAILY AS NEEDED 90 capsule 3   No facility-administered medications prior to visit.     ROS Review of Systems  Constitutional: Negative for appetite change, chills, diaphoresis and fatigue.  HENT: Negative.  Negative for trouble swallowing.   Eyes: Negative.   Respiratory: Positive for chest tightness. Negative for cough, choking, shortness of breath, wheezing and stridor.   Cardiovascular: Positive for chest pain. Negative for palpitations and leg swelling.  Gastrointestinal: Negative.  Negative for abdominal pain, blood in stool, constipation, diarrhea, nausea and vomiting.  Endocrine: Negative.   Genitourinary: Negative.  Negative for decreased urine volume, difficulty urinating, dysuria and hematuria.  Musculoskeletal: Negative.  Negative for neck pain.  Skin: Negative.  Negative for pallor.  Allergic/Immunologic: Negative.   Neurological: Negative for dizziness, tremors, weakness, numbness and headaches.  Hematological: Negative.  Negative for adenopathy. Does not bruise/bleed easily.  Psychiatric/Behavioral: Negative.     Objective:  BP 132/80   Pulse 86   Temp 98 F (36.7 C) (Oral)   Resp 16   Wt 122 lb (55.3 kg)   SpO2 97%   BMI 22.31 kg/m   BP Readings from Last 3 Encounters:  08/19/15 132/80  08/18/15  131/76  08/12/15 120/80    Wt Readings from Last 3 Encounters:  08/19/15 122 lb (55.3 kg)  08/18/15 123 lb (55.8 kg)  08/12/15 128 lb (58.1 kg)    Physical Exam  Constitutional: She is oriented to person, place, and time.  Non-toxic appearance. She does not have a sickly appearance. She does not appear ill. No distress.  HENT:  Mouth/Throat: Oropharynx is clear and moist. No oropharyngeal exudate.  Eyes: Conjunctivae are normal. Right eye exhibits no discharge. Left eye exhibits no discharge. No scleral icterus.    Neck: Normal range of motion. Neck supple. No JVD present. No tracheal deviation present. No thyromegaly present.  Cardiovascular: Normal rate, regular rhythm, normal heart sounds and intact distal pulses.  Exam reveals no gallop and no friction rub.   No murmur heard. EKG ---  Sinus  Rhythm  Low voltage in precordial leads.   ABNORMAL   Pulmonary/Chest: Effort normal and breath sounds normal. No stridor. No respiratory distress. She has no wheezes. She has no rales. She exhibits no tenderness.  Abdominal: Soft. Bowel sounds are normal. She exhibits no distension and no mass. There is no tenderness. There is no rebound and no guarding.  Musculoskeletal: Normal range of motion. She exhibits no edema or tenderness.  Lymphadenopathy:    She has no cervical adenopathy.  Neurological: She is oriented to person, place, and time.  Skin: Skin is warm and dry. No rash noted. She is not diaphoretic. No erythema. No pallor.  Vitals reviewed.   Lab Results  Component Value Date   WBC 7.0 08/18/2015   HGB 12.4 08/18/2015   HCT 39.3 08/18/2015   PLT 267 08/18/2015   GLUCOSE 96 08/18/2015   CHOL 162 12/05/2014   TRIG 204.0 (H) 12/05/2014   HDL 47.30 12/05/2014   LDLDIRECT 86.0 12/05/2014   LDLCALC 70 07/26/2013   ALT 18 01/29/2014   AST 21 01/29/2014   NA 138 08/18/2015   K 4.0 08/18/2015   CL 102 08/18/2015   CREATININE 0.70 08/18/2015   BUN 12 08/18/2015   CO2 29 08/18/2015   TSH 1.89 01/29/2014   HGBA1C 6.4 04/30/2015   MICROALBUR 1.6 04/30/2015    Dg Chest 2 View  Result Date: 08/18/2015 CLINICAL DATA:  70 year old female with chest pain and shortness of breath EXAM: CHEST  2 VIEW COMPARISON:  Prior chest x-ray 01/13/2011 FINDINGS: The lungs are clear and negative for focal airspace consolidation, pulmonary edema or suspicious pulmonary nodule. Chronic blunting of the left costophrenic angle consistent with a mildly prominent epicardial fat pad. No pleural effusion or  pneumothorax. Cardiac and mediastinal contours are within normal limits. No acute fracture or lytic or blastic osseous lesions. The visualized upper abdominal bowel gas pattern is unremarkable. IMPRESSION: No active cardiopulmonary disease. Electronically Signed   By: Jacqulynn Cadet M.D.   On: 08/18/2015 13:20   Assessment & Plan:   Angeliz was seen today for gastroesophageal reflux and chest pain.  Diagnoses and all orders for this visit:  Gastroesophageal reflux disease with esophagitis- I've asked her to upgrade to Nexium for more potent proton pump inhibitor. -     esomeprazole (NEXIUM) 40 MG capsule; Take 1 capsule (40 mg total) by mouth daily at 12 noon. -     Ambulatory referral to Gastroenterology  Barrett's esophagus without dysplasia- I've asked her to see GI for reevaluation of this. -     esomeprazole (NEXIUM) 40 MG capsule; Take 1 capsule (40 mg total) by mouth daily at  12 noon. -     Ambulatory referral to Gastroenterology  Atypical chest pain- her EKG remains normal, I think her pain is related to esophageal disease, will change to a more potent proton pump inhibitor and asked her to see GI. -     EKG 12-Lead   I have discontinued Jill Chandler omeprazole. I am also having her start on esomeprazole. Additionally, I am having her maintain her Calcium Carbonate-Vitamin D (CALCIUM + D PO), fish oil-omega-3 fatty acids, Cholecalciferol (VITAMIN D PO), ammonium lactate, FLUoxetine, buPROPion, glucose blood, traZODone, amphetamine-dextroamphetamine, simvastatin, metFORMIN, and etodolac.  Meds ordered this encounter  Medications  . esomeprazole (NEXIUM) 40 MG capsule    Sig: Take 1 capsule (40 mg total) by mouth daily at 12 noon.    Dispense:  90 capsule    Refill:  3     Follow-up: Return in about 3 months (around 11/19/2015).  Scarlette Calico, MD

## 2015-08-21 ENCOUNTER — Encounter: Payer: Self-pay | Admitting: Internal Medicine

## 2015-09-03 ENCOUNTER — Encounter: Payer: Self-pay | Admitting: Internal Medicine

## 2015-09-09 DIAGNOSIS — Z1231 Encounter for screening mammogram for malignant neoplasm of breast: Secondary | ICD-10-CM | POA: Diagnosis not present

## 2015-09-09 DIAGNOSIS — Z803 Family history of malignant neoplasm of breast: Secondary | ICD-10-CM | POA: Diagnosis not present

## 2015-09-09 LAB — HM MAMMOGRAPHY

## 2015-09-11 ENCOUNTER — Encounter: Payer: Self-pay | Admitting: Internal Medicine

## 2015-09-11 NOTE — Progress Notes (Unsigned)
Normal mammogram abstracted to chart.

## 2015-09-16 ENCOUNTER — Other Ambulatory Visit: Payer: Self-pay | Admitting: Internal Medicine

## 2015-09-16 ENCOUNTER — Encounter: Payer: Self-pay | Admitting: Internal Medicine

## 2015-09-16 DIAGNOSIS — F988 Other specified behavioral and emotional disorders with onset usually occurring in childhood and adolescence: Secondary | ICD-10-CM

## 2015-09-17 MED ORDER — AMPHETAMINE-DEXTROAMPHETAMINE 20 MG PO TABS: 20.0000 mg | ORAL_TABLET | Freq: Two times a day (BID) | ORAL | 0 refills | Status: DC

## 2015-09-17 NOTE — Telephone Encounter (Signed)
Pt informed that rx is up front and ready to be picked up.

## 2015-09-17 NOTE — Telephone Encounter (Signed)
Pt called to check up on this request. Please call her once its done.

## 2015-09-18 ENCOUNTER — Other Ambulatory Visit: Payer: Self-pay | Admitting: *Deleted

## 2015-09-18 MED ORDER — FLUOXETINE HCL 40 MG PO CAPS: ORAL_CAPSULE | ORAL | 2 refills | Status: DC

## 2015-10-21 ENCOUNTER — Encounter: Payer: Self-pay | Admitting: *Deleted

## 2015-10-23 ENCOUNTER — Other Ambulatory Visit (INDEPENDENT_AMBULATORY_CARE_PROVIDER_SITE_OTHER): Payer: Medicare Other

## 2015-10-23 ENCOUNTER — Encounter: Payer: Self-pay | Admitting: Physician Assistant

## 2015-10-23 ENCOUNTER — Ambulatory Visit (INDEPENDENT_AMBULATORY_CARE_PROVIDER_SITE_OTHER): Payer: Medicare Other | Admitting: Physician Assistant

## 2015-10-23 VITALS — BP 124/80 | HR 78 | Ht 62.0 in | Wt 121.4 lb

## 2015-10-23 DIAGNOSIS — R1013 Epigastric pain: Secondary | ICD-10-CM | POA: Diagnosis not present

## 2015-10-23 DIAGNOSIS — R1012 Left upper quadrant pain: Secondary | ICD-10-CM | POA: Diagnosis not present

## 2015-10-23 LAB — BASIC METABOLIC PANEL
BUN: 10 mg/dL (ref 6–23)
CHLORIDE: 103 meq/L (ref 96–112)
CO2: 31 meq/L (ref 19–32)
Calcium: 9.2 mg/dL (ref 8.4–10.5)
Creatinine, Ser: 0.68 mg/dL (ref 0.40–1.20)
GFR: 91.42 mL/min (ref >60.00)
Glucose, Bld: 106 mg/dL — ABNORMAL HIGH (ref 70–99)
Potassium: 4.3 mEq/L (ref 3.5–5.1)
SODIUM: 139 meq/L (ref 135–145)

## 2015-10-23 NOTE — Progress Notes (Signed)
Subjective:    Patient ID: Jill Chandler, female    DOB: [birth year], 70 y.o.   MRN: [id number]  HPI Reshunda is a pleasant 70 year old white female known to Dr. Carlean Purl with history of Barrett's esophagus, chronic GERD, adult-onset diabetes mellitus, diverticulosis and history of adenomatous colon polyps. She comes in today with complaints of intermittent upper abdominal pain the past few months. She says she had an episode of severe what she describes as a crushing" pain across her diaphragm area in July 2017 which took her to the emergency room. She says this was associated with a sense of air hunger. She underwent workup in the emergency room to rule out cardiac etiology and workup was negative. She says she was uncomfortable for about 2 days pain resolved and has not recurred. She mentions that she felt like her "stomach was up in my chest" with that episode. However she has been having intermittent pressure or tight type of pain across her upper abdomen and lower chest area and into her left upper quadrant. She says she's had some dull aching in the left upper quadrant for a long time. Noticed any changes in her bowel habits. She has no associated nausea or vomiting appetite is been fine and weight has been stable and she does not feel that her symptoms are necessarily aggravated by by mouth intake. She does have a prescription for Lodine but does not take this regularly, she is on Prilosec 40 mg by mouth daily. She is concerned about possibly needing another endoscopy. Last EGD done in January 2015 showed a 5 cm hiatal hernia and suspected Barrett's biopsies did show Barrett's without dysplasia. Last colonoscopy January 2015 severe sigmoid diverticulosis 2 diminutive polyps were removed from the descending colon and passed showed tubular adenomas.  Review of Systems Pertinent positive and negative review of systems were noted in the above HPI section.  All other review of systems was otherwise  negative.  Outpatient Encounter Prescriptions as of 10/23/2015  Medication Sig  . ammonium lactate (LAC-HYDRIN) 12 % cream Apply topically as needed for dry skin.  Marland Kitchen amphetamine-dextroamphetamine (ADDERALL) 20 MG tablet Take 1 tablet (20 mg total) by mouth 2 (two) times daily.  Marland Kitchen buPROPion (WELLBUTRIN XL) 150 MG 24 hr tablet Take 150 mg by mouth every morning.  . Calcium Carbonate-Vitamin D (CALCIUM + D PO) Take by mouth.    . Cholecalciferol (VITAMIN D PO) Take by mouth.    . esomeprazole (NEXIUM) 40 MG capsule Take 1 capsule (40 mg total) by mouth daily at 12 noon.  . etodolac (LODINE) 200 MG capsule Take 1 capsule (200 mg total) by mouth 2 (two) times daily.  . fish oil-omega-3 fatty acids 1000 MG capsule Take 2 g by mouth daily.    Marland Kitchen FLUoxetine (PROZAC) 40 MG capsule TAKE 1 BY MOUTH TWICE DAILY  . glucose blood (ONETOUCH VERIO) test strip Use as instructed to check blood sugar DX:E11.8  . metFORMIN (GLUCOPHAGE-XR) 750 MG 24 hr tablet Take 1 tablet (750 mg total) by mouth daily with breakfast.  . simvastatin (ZOCOR) 40 MG tablet TAKE 1 BY MOUTH AT BEDTIME  . traZODone (DESYREL) 150 MG tablet TAKE 1 BY MOUTH AT BEDTIME AS NEEDED FOR SLEEP   No facility-administered encounter medications on file as of 10/23/2015.    No Known Allergies Patient Active Problem List   Diagnosis Date Noted  . Left shoulder pain 08/12/2015  . Rotator cuff arthropathy 08/12/2015  . 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  History of cervical dysplasia 09/06/2013  . Personal history of squamous cell carcinoma of skin 09/06/2013  . Barrett's esophagus 03/01/2013  . Osteoporosis, senile 11/30/2012  . Depression, controlled 10/31/2012  . ADD (attention deficit disorder) 10/31/2012  . Type II diabetes mellitus with manifestations ([place]) 04/10/2011  . Nonspecific abnormal electrocardiogram (ECG) (EKG) 03/12/2011  . Hyperlipidemia with target LDL less than 100 03/08/2009  . GERD 03/08/2009  . Personal  history of colonic adenoma 06/14/2006   Social History   Social History  . Marital status: Single    Spouse name: N/A  . Number of children: N/A  . Years of education: N/A   Occupational History  . Not on file.   Social History Main Topics  . Smoking status: Former Smoker    Quit date: 09/07/1998  . Smokeless tobacco: Never Used  . Alcohol use Yes     Comment: Occasionally  . Drug use: No  . Sexual activity: Yes    Birth control/ protection: Post-menopausal   Other Topics Concern  . Not on file   Social History Narrative   The patient is divorced, 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 3 caffeinated beverages daily.          Ms. Brandes family history includes Alcohol abuse in her other; Breast cancer (age of onset: 37) in her sister and sister; Diabetes in her sister; Drug abuse in her other; Heart disease in her mother; Hyperlipidemia in her other; Hypertension in her other and sister; Osteoporosis in her sister.      Objective:    Vitals:   10/23/15 1105  BP: 124/80  Pulse: 78    Physical Exam   well-developed older white female in no acute distress, pleasant blood pressure 124/80 pulse 78 height 5 foot 2 weight 121 BMI 22.2. HEENT; nontraumatic normocephalic EOMI PERRLA sclera anicteric, Cardiovascular; regular rate and rhythm with S1-S2 no murmur or gallop, Pulmonary clear bilaterally, Abdomen ;soft mild tenderness in the epigastrium and left upper quadrant there is no guarding or rebound no palpable mass or hepatosplenomegaly bowel sounds are present, Rectal ;exam not done, Ext; no clubbing cyanosis or edema skin warm and dry, Neuropsych; mood and affect appropriate       Assessment & Plan:   #51 70 year old white female presenting with intermittent epigastric and left upper quadrant pain described as dull and pressure-like over the past few months. Patient had 1 severe episode July 2017. She was evaluated in the emergency room and ruled out  for cardiac etiology. Source of her pain is not clear #2 chronic GERD and large hiatal hernia #3 history of Barrett's esophagus due for follow-up January 2018 #4 severe sigmoid diverticulosis #5 adenomatous colon polyps due for follow-up colonoscopy January 2020 #6 adult-onset diabetes mellitus #7 depression #8 ADD  Plan; continue omeprazole 40 mg by mouth every morning Schedule for CT of the abdomen and pelvis with contrast If CT is unrevealing will need EGD with Dr. Twana First which time she can also be surveilled for Barrett's   Amy Genia Harold PA-C 10/23/2015   Cc: Janith Lima, MD

## 2015-10-23 NOTE — Patient Instructions (Addendum)
Please go to the basement level to have your labs drawn.  Continue Prilosec 20 mg, take 1 tab every morning.   You have been scheduled for a CT scan of the abdomen and pelvis at [facility] ([address]---this is in the same building as Press [facility]).   You are scheduled on Monday 10-28-2015 at 3:30 PM. to your appointment time for registration. Please follow the written instructions below on the day of your exam:  WARNING: IF YOU ARE ALLERGIC TO IODINE/X-RAY DYE, PLEASE NOTIFY RADIOLOGY IMMEDIATELY AT [phone]! YOU WILL BE GIVEN A 13 HOUR PREMEDICATION PREP.  1) Do not eat or drink anything after 11:30 am (4 hours prior to your test) 2) You have been given 2 bottles of oral contrast to drink. The solution may taste               better if refrigerated, but do NOT add ice or any other liquid to this solution. Shake             well before drinking.    Drink 1 bottle of contrast @ 1:30 PM (2 hours prior to your exam)  Drink 1 bottle of contrast @ 2:30 PM (1 hour prior to your exam)  You may take any medications as prescribed with a small amount of water except for the following: Metformin, Glucophage, Glucovance, Avandamet, Riomet, Fortamet, Actoplus Met, Janumet, Glumetza or Metaglip. The above medications must be held the day of the exam AND 48 hours after the exam.  The purpose of you drinking the oral contrast is to aid in the visualization of your intestinal tract. The contrast solution may cause some diarrhea. Before your exam is started, you will be given a small amount of fluid to drink. Depending on your individual set of symptoms, you may also receive an intravenous injection of x-ray contrast/dye. Plan on being at [facility] for 30 minutes or long, depending on the type of exam you are having performed.  If you have any questions regarding your exam or if you need to reschedule, you may call the CT department at [phone] between the hours of 8:00 am  and 5:00 pm, Monday-Friday.  ________________________________________________________________________

## 2015-10-28 ENCOUNTER — Ambulatory Visit (INDEPENDENT_AMBULATORY_CARE_PROVIDER_SITE_OTHER)
Admission: RE | Admit: 2015-10-28 | Discharge: 2015-10-28 | Disposition: A | Discharge status: Home / Self Care | Payer: Medicare Other | Source: Ambulatory Visit | Attending: Physician Assistant | Admitting: Physician Assistant

## 2015-10-28 DIAGNOSIS — R1012 Left upper quadrant pain: Secondary | ICD-10-CM

## 2015-10-28 DIAGNOSIS — R1013 Epigastric pain: Secondary | ICD-10-CM

## 2015-10-28 MED ORDER — IOPAMIDOL (ISOVUE-300) INJECTION 61%
100.0000 mL | Freq: Once | INTRAVENOUS | Status: AC | PRN
  Administered 2015-10-28: 100 mL via INTRAVENOUS

## 2015-10-30 ENCOUNTER — Encounter: Payer: Self-pay | Admitting: Internal Medicine

## 2015-10-30 ENCOUNTER — Ambulatory Visit (INDEPENDENT_AMBULATORY_CARE_PROVIDER_SITE_OTHER): Payer: Medicare Other | Admitting: Internal Medicine

## 2015-10-30 VITALS — BP 122/80 | HR 85 | Temp 97.9°F | Ht 62.0 in | Wt 122.5 lb

## 2015-10-30 DIAGNOSIS — F329 Major depressive disorder, single episode, unspecified: Secondary | ICD-10-CM | POA: Diagnosis not present

## 2015-10-30 DIAGNOSIS — F902 Attention-deficit hyperactivity disorder, combined type: Secondary | ICD-10-CM | POA: Diagnosis not present

## 2015-10-30 DIAGNOSIS — E785 Hyperlipidemia, unspecified: Secondary | ICD-10-CM

## 2015-10-30 DIAGNOSIS — Z23 Encounter for immunization: Secondary | ICD-10-CM | POA: Diagnosis not present

## 2015-10-30 DIAGNOSIS — E118 Type 2 diabetes mellitus with unspecified complications: Secondary | ICD-10-CM | POA: Diagnosis not present

## 2015-10-30 DIAGNOSIS — F32A Depression, unspecified: Secondary | ICD-10-CM

## 2015-10-30 MED ORDER — AMPHETAMINE-DEXTROAMPHETAMINE 20 MG PO TABS: 20.0000 mg | ORAL_TABLET | Freq: Two times a day (BID) | ORAL | 0 refills | Status: DC

## 2015-10-30 MED ORDER — BUPROPION HCL ER (XL) 150 MG PO TB24: 150.0000 mg | ORAL_TABLET | Freq: Every morning | ORAL | 3 refills | Status: DC

## 2015-10-30 NOTE — Progress Notes (Signed)
Pre visit review using our clinic review tool, if applicable. No additional management support is needed unless otherwise documented below in the visit note. 

## 2015-10-30 NOTE — Patient Instructions (Signed)

## 2015-10-30 NOTE — Progress Notes (Signed)
CT was unrevealing EGD next  Agree with Ms. Genia Harold assessment and plan. Gatha Mayer, MD, [facility]

## 2015-10-30 NOTE — Progress Notes (Signed)
Subjective:  Patient ID: Jill Chandler, female    DOB: [birth year]  Age: 70 y.o. MRN: [id number]  CC: Diabetes and Hyperlipidemia   HPI Jill Chandler presents for f/up.  Pt states ADD status overall stable on current meds with overall good compliance and tolerability, and good effectiveness with respect to ability for concentration and task completion.  She tells me her blood pressures have been well controlled and she is tolerating metformin with no side effects. She denies visual disturbance or polys.  Outpatient Medications Prior to Visit  Medication Sig Dispense Refill  . ammonium lactate (LAC-HYDRIN) 12 % cream Apply topically as needed for dry skin. 385 g 0  . Calcium Carbonate-Vitamin D (CALCIUM + D PO) Take by mouth.      . Cholecalciferol (VITAMIN D PO) Take by mouth.      . esomeprazole (NEXIUM) 40 MG capsule Take 1 capsule (40 mg total) by mouth daily at 12 noon. 90 capsule 3  . etodolac (LODINE) 200 MG capsule Take 1 capsule (200 mg total) by mouth 2 (two) times daily. 60 capsule 3  . fish oil-omega-3 fatty acids 1000 MG capsule Take 2 g by mouth daily.      Marland Kitchen FLUoxetine (PROZAC) 40 MG capsule TAKE 1 BY MOUTH TWICE DAILY 180 capsule 2  . glucose blood (ONETOUCH VERIO) test strip Use as instructed to check blood sugar DX:E11.8 200 each 3  . metFORMIN (GLUCOPHAGE-XR) 750 MG 24 hr tablet Take 1 tablet (750 mg total) by mouth daily with breakfast. 90 tablet 3  . simvastatin (ZOCOR) 40 MG tablet TAKE 1 BY MOUTH AT BEDTIME 90 tablet 3  . traZODone (DESYREL) 150 MG tablet TAKE 1 BY MOUTH AT BEDTIME AS NEEDED FOR SLEEP 90 tablet 3  . amphetamine-dextroamphetamine (ADDERALL) 20 MG tablet Take 1 tablet (20 mg total) by mouth 2 (two) times daily. 180 tablet 0  . buPROPion (WELLBUTRIN XL) 150 MG 24 hr tablet Take 150 mg by mouth every morning.  4   No facility-administered medications prior to visit.     ROS Review of Systems  Constitutional: Negative.  Negative for activity change,  chills, diaphoresis, fatigue and fever.  HENT: Negative.   Eyes: Negative for visual disturbance.  Respiratory: Negative.  Negative for cough, choking, shortness of breath and stridor.   Cardiovascular: Negative.  Negative for chest pain, palpitations and leg swelling.  Gastrointestinal: Negative.  Negative for abdominal pain, constipation, diarrhea, nausea and vomiting.  Endocrine: Negative for polydipsia, polyphagia and polyuria.  Genitourinary: Negative.   Musculoskeletal: Negative.  Negative for back pain, myalgias and neck pain.  Skin: Negative.  Negative for color change and rash.  Allergic/Immunologic: Negative.   Neurological: Negative.  Negative for dizziness.  Hematological: Negative.  Negative for adenopathy. Does not bruise/bleed easily.  Psychiatric/Behavioral: Positive for decreased concentration. Negative for agitation, behavioral problems, confusion, dysphoric mood, self-injury and suicidal ideas. The patient is not nervous/anxious.     Objective:  BP 122/80 (BP Location: Left Arm, Patient Position: Sitting, Cuff Size: Normal)   Pulse 85   Temp 97.9 F (36.6 C) (Oral)   Ht 5\' 2"  (1.575 m)   Wt 122 lb 8 oz (55.6 kg)   SpO2 95%   BMI 22.41 kg/m   BP Readings from Last 3 Encounters:  10/30/15 122/80  10/23/15 124/80  08/19/15 132/80    Wt Readings from Last 3 Encounters:  10/30/15 122 lb 8 oz (55.6 kg)  10/23/15 121 lb 6.4 oz (55.1 kg)  08/19/15 122 lb (55.3 kg)    Physical Exam  Constitutional: She is oriented to person, place, and time. No distress.  HENT:  Mouth/Throat: Oropharynx is clear and moist. No oropharyngeal exudate.  Eyes: Conjunctivae are normal. Right eye exhibits no discharge. Left eye exhibits no discharge. No scleral icterus.  Neck: Normal range of motion. Neck supple. No JVD present. No tracheal deviation present. No thyromegaly present.  Cardiovascular: Normal rate, regular rhythm, normal heart sounds and intact distal pulses.  Exam  reveals no gallop and no friction rub.   No murmur heard. Pulmonary/Chest: Effort normal and breath sounds normal. No stridor. No respiratory distress. She has no wheezes. She has no rales. She exhibits no tenderness.  Abdominal: Soft. Bowel sounds are normal. She exhibits no distension and no mass. There is no tenderness. There is no rebound and no guarding.  Musculoskeletal: Normal range of motion. She exhibits no edema, tenderness or deformity.  Lymphadenopathy:    She has no cervical adenopathy.  Neurological: She is oriented to person, place, and time.  Skin: Skin is warm and dry. No rash noted. She is not diaphoretic. No erythema. No pallor.  Psychiatric: She has a normal mood and affect. Her behavior is normal. Judgment and thought content normal.  Vitals reviewed.   Lab Results  Component Value Date   WBC 7.0 08/18/2015   HGB 12.4 08/18/2015   HCT 39.3 08/18/2015   PLT 267 08/18/2015   GLUCOSE 106 (H) 10/23/2015   CHOL 162 12/05/2014   TRIG 204.0 (H) 12/05/2014   HDL 47.30 12/05/2014   LDLDIRECT 86.0 12/05/2014   LDLCALC 70 07/26/2013   ALT 18 01/29/2014   AST 21 01/29/2014   NA 139 10/23/2015   K 4.3 10/23/2015   CL 103 10/23/2015   CREATININE 0.68 10/23/2015   BUN 10 10/23/2015   CO2 31 10/23/2015   TSH 1.89 01/29/2014   HGBA1C 6.4 04/30/2015   MICROALBUR 1.6 04/30/2015    Ct Abdomen Pelvis W Contrast  Result Date: 10/28/2015 CLINICAL DATA:  Left upper quadrant abdominal pain for the past 3 months. EXAM: CT ABDOMEN AND PELVIS WITH CONTRAST TECHNIQUE: Multidetector CT imaging of the abdomen and pelvis was performed using the standard protocol following bolus administration of intravenous contrast. CONTRAST:  15mL ISOVUE-300 IOPAMIDOL (ISOVUE-300) INJECTION 61% COMPARISON:  None. FINDINGS: Lower chest: Minimal bilateral dependent atelectasis. No pleural fluid. Hepatobiliary: No focal liver abnormality is seen. No gallstones, gallbladder wall thickening, or biliary  dilatation. Pancreas: Unremarkable. No pancreatic ductal dilatation or surrounding inflammatory changes. Spleen: Normal in size without focal abnormality. Adrenals/Urinary Tract: Adrenal glands are unremarkable. Kidneys are normal, without renal calculi, focal lesion, or hydronephrosis. Bladder is unremarkable. Stomach/Bowel: Mild colonic diverticulosis. No gastric or small bowel abnormalities. Normal appearing appendix. Vascular/Lymphatic: Mild atheromatous arterial calcifications, including the abdominal aorta. Dense calcifications at the origin of the left renal artery. No enlarged lymph nodes. Reproductive: 5.5 cm left ovarian cyst. Normal appearing uterus and right ovary. Other: None. Musculoskeletal: Mild lumbar and lower thoracic spine degenerative changes. L5-S1 vacuum phenomenon. IMPRESSION: 1. No acute abnormality. 2. 5.5 cm left ovarian cyst. Further characterization with pelvic ultrasound is recommended. 3. Mild aortic atherosclerosis. 4. Mild colonic diverticulosis. Electronically Signed   By: Claudie Revering M.D.   On: 10/28/2015 16:48    Assessment & Plan:   Serine was seen today for diabetes and hyperlipidemia.  Diagnoses and all orders for this visit:  Type 2 diabetes mellitus with complication, without long-term current use of insulin ([place])-  I will monitor her A1c and her renal function, I will make changes if indicated. -     Comprehensive metabolic panel; Future -     Hemoglobin A1c; Future  Hyperlipidemia with target LDL less than 100- she is doing well on statin therapy, I will recheck her lipids to see if she has achieved her LDL goal -     Comprehensive metabolic panel; Future -     Lipid panel; Future -     TSH; Future  Need for prophylactic vaccination and inoculation against influenza -     Flu vaccine HIGH DOSE PF (Fluzone High dose)  Depression, controlled- will continue Prozac and Wellbutrin since her depression is in remission on this regimen -     buPROPion  (WELLBUTRIN XL) 150 MG 24 hr tablet; Take 1 tablet (150 mg total) by mouth every morning.  Attention deficit hyperactivity disorder (ADHD), combined type -     buPROPion (WELLBUTRIN XL) 150 MG 24 hr tablet; Take 1 tablet (150 mg total) by mouth every morning. -     Discontinue: amphetamine-dextroamphetamine (ADDERALL) 20 MG tablet; Take 1 tablet (20 mg total) by mouth 2 (two) times daily. -     amphetamine-dextroamphetamine (ADDERALL) 20 MG tablet; Take 1 tablet (20 mg total) by mouth 2 (two) times daily.   I have changed Ms. Zoda buPROPion. I am also having her maintain her Calcium Carbonate-Vitamin D (CALCIUM + D PO), fish oil-omega-3 fatty acids, Cholecalciferol (VITAMIN D PO), ammonium lactate, glucose blood, traZODone, simvastatin, metFORMIN, etodolac, esomeprazole, FLUoxetine, omeprazole, and amphetamine-dextroamphetamine.  Meds ordered this encounter  Medications  . omeprazole (PRILOSEC) 20 MG capsule    Sig: TK ONE C PO D PRN    Refill:  2  . buPROPion (WELLBUTRIN XL) 150 MG 24 hr tablet    Sig: Take 1 tablet (150 mg total) by mouth every morning.    Dispense:  90 tablet    Refill:  3  . DISCONTD: amphetamine-dextroamphetamine (ADDERALL) 20 MG tablet    Sig: Take 1 tablet (20 mg total) by mouth 2 (two) times daily.    Dispense:  180 tablet    Refill:  0    Fill on or after 10/30/15  . amphetamine-dextroamphetamine (ADDERALL) 20 MG tablet    Sig: Take 1 tablet (20 mg total) by mouth 2 (two) times daily.    Dispense:  180 tablet    Refill:  0    Fill on or after 10/30/15     Follow-up: Return in about 6 months (around 04/29/2016).  Scarlette Calico, MD

## 2015-11-05 ENCOUNTER — Other Ambulatory Visit (INDEPENDENT_AMBULATORY_CARE_PROVIDER_SITE_OTHER): Payer: Medicare Other

## 2015-11-05 ENCOUNTER — Encounter: Payer: Self-pay | Admitting: Internal Medicine

## 2015-11-05 DIAGNOSIS — E785 Hyperlipidemia, unspecified: Secondary | ICD-10-CM | POA: Diagnosis not present

## 2015-11-05 DIAGNOSIS — E118 Type 2 diabetes mellitus with unspecified complications: Secondary | ICD-10-CM

## 2015-11-05 LAB — LIPID PANEL
Cholesterol: 141 mg/dL (ref 0–200)
HDL: 46.7 mg/dL (ref >39.00)
NONHDL: 94.17
Total CHOL/HDL Ratio: 3
Triglycerides: 205 mg/dL — ABNORMAL HIGH (ref 0.0–149.0)
VLDL: 41 mg/dL — ABNORMAL HIGH (ref 0.0–40.0)

## 2015-11-05 LAB — TSH: TSH: 2 u[IU]/mL (ref 0.35–4.50)

## 2015-11-05 LAB — COMPREHENSIVE METABOLIC PANEL
ALT: 18 U/L (ref 0–35)
AST: 17 U/L (ref 0–37)
Albumin: 4.1 g/dL (ref 3.5–5.2)
Alkaline Phosphatase: 68 U/L (ref 39–117)
BILIRUBIN TOTAL: 0.2 mg/dL (ref 0.2–1.2)
BUN: 12 mg/dL (ref 6–23)
CO2: 29 meq/L (ref 19–32)
Calcium: 9.4 mg/dL (ref 8.4–10.5)
Chloride: 102 mEq/L (ref 96–112)
Creatinine, Ser: 0.75 mg/dL (ref 0.40–1.20)
GFR: 81.64 mL/min (ref >60.00)
GLUCOSE: 165 mg/dL — AB (ref 70–99)
Potassium: 4 mEq/L (ref 3.5–5.1)
SODIUM: 139 meq/L (ref 135–145)
Total Protein: 6.6 g/dL (ref 6.0–8.3)

## 2015-11-05 LAB — HEMOGLOBIN A1C: HEMOGLOBIN A1C: 6.4 % (ref 4.6–6.5)

## 2015-11-05 LAB — LDL CHOLESTEROL, DIRECT: Direct LDL: 72 mg/dL

## 2015-11-12 ENCOUNTER — Ambulatory Visit: Payer: Medicare Other | Admitting: *Deleted

## 2015-11-12 VITALS — Ht 62.0 in | Wt 123.2 lb

## 2015-11-12 DIAGNOSIS — R1012 Left upper quadrant pain: Secondary | ICD-10-CM

## 2015-11-12 NOTE — Progress Notes (Signed)
Denies allergies to eggs or soy products. Denies complications with sedation or anesthesia. Denies O2 use. Denies use of diet or weight loss medications.  Emmi instructions given for endoscopy.  

## 2015-11-13 ENCOUNTER — Ambulatory Visit: Payer: Medicare Other | Admitting: Internal Medicine

## 2015-11-18 ENCOUNTER — Encounter: Payer: Self-pay | Admitting: Internal Medicine

## 2015-11-27 ENCOUNTER — Ambulatory Visit (INDEPENDENT_AMBULATORY_CARE_PROVIDER_SITE_OTHER): Payer: Medicare Other | Admitting: Internal Medicine

## 2015-11-27 ENCOUNTER — Encounter: Payer: Self-pay | Admitting: Internal Medicine

## 2015-11-27 ENCOUNTER — Ambulatory Visit (INDEPENDENT_AMBULATORY_CARE_PROVIDER_SITE_OTHER)
Admission: RE | Admit: 2015-11-27 | Discharge: 2015-11-27 | Disposition: A | Discharge status: Home / Self Care | Payer: Medicare Other | Source: Ambulatory Visit | Attending: Internal Medicine | Admitting: Internal Medicine

## 2015-11-27 VITALS — BP 112/70 | HR 85 | Temp 97.7°F | Resp 16 | Ht 62.0 in | Wt 121.5 lb

## 2015-11-27 DIAGNOSIS — R059 Cough, unspecified: Secondary | ICD-10-CM

## 2015-11-27 DIAGNOSIS — J4520 Mild intermittent asthma, uncomplicated: Secondary | ICD-10-CM

## 2015-11-27 DIAGNOSIS — J988 Other specified respiratory disorders: Secondary | ICD-10-CM | POA: Diagnosis not present

## 2015-11-27 DIAGNOSIS — R05 Cough: Secondary | ICD-10-CM

## 2015-11-27 HISTORY — DX: Mild intermittent asthma, uncomplicated: J45.20

## 2015-11-27 MED ORDER — MOMETASONE FURO-FORMOTEROL FUM 200-5 MCG/ACT IN AERO: 2.0000 | INHALATION_SPRAY | Freq: Two times a day (BID) | RESPIRATORY_TRACT | 3 refills | Status: DC

## 2015-11-27 MED ORDER — CEFDINIR 300 MG PO CAPS: 300.0000 mg | ORAL_CAPSULE | Freq: Two times a day (BID) | ORAL | 1 refills | Status: AC

## 2015-11-27 MED ORDER — PROMETHAZINE HCL 12.5 MG PO TABS: 12.5000 mg | ORAL_TABLET | Freq: Four times a day (QID) | ORAL | 0 refills | Status: DC | PRN

## 2015-11-27 NOTE — Progress Notes (Signed)
Pre visit review using our clinic review tool, if applicable. No additional management support is needed unless otherwise documented below in the visit note. 

## 2015-11-27 NOTE — Patient Instructions (Signed)

## 2015-11-28 ENCOUNTER — Telehealth: Payer: Self-pay

## 2015-11-28 ENCOUNTER — Encounter: Payer: Self-pay | Admitting: Internal Medicine

## 2015-11-28 NOTE — Telephone Encounter (Signed)
PA initiated and DENIED via CoverMyMeds key [id number]. Please advise, thanks!

## 2015-11-28 NOTE — Progress Notes (Signed)
Subjective:  Patient ID: Jill Chandler, female    DOB: 1945/06/25  Age: 70 y.o. MRN: [id number]  CC: Cough   HPI Jill Chandler presents for a 4 week history of cough productive of yellow phlegm with wheezing. She has a remote history of smoking and had pneumonia as a child. She denies fever, chills, chest pain, hemoptysis, night sweats, or weight loss.  Outpatient Medications Prior to Visit  Medication Sig Dispense Refill  . ammonium lactate (LAC-HYDRIN) 12 % cream Apply topically as needed for dry skin. 385 g 0  . amphetamine-dextroamphetamine (ADDERALL) 20 MG tablet Take 1 tablet (20 mg total) by mouth 2 (two) times daily. 180 tablet 0  . buPROPion (WELLBUTRIN XL) 150 MG 24 hr tablet Take 1 tablet (150 mg total) by mouth every morning. 90 tablet 3  . Calcium Carbonate-Vitamin D (CALCIUM + D PO) Take by mouth.      . etodolac (LODINE) 200 MG capsule Take 1 capsule (200 mg total) by mouth 2 (two) times daily. 60 capsule 3  . fish oil-omega-3 fatty acids 1000 MG capsule Take 2 g by mouth daily.      Marland Kitchen FLUoxetine (PROZAC) 40 MG capsule TAKE 1 BY MOUTH TWICE DAILY 180 capsule 2  . glucose blood (ONETOUCH VERIO) test strip Use as instructed to check blood sugar DX:E11.8 200 each 3  . metFORMIN (GLUCOPHAGE-XR) 750 MG 24 hr tablet Take 1 tablet (750 mg total) by mouth daily with breakfast. 90 tablet 3  . omeprazole (PRILOSEC) 20 MG capsule TK ONE C PO D PRN  2  . simvastatin (ZOCOR) 40 MG tablet TAKE 1 BY MOUTH AT BEDTIME 90 tablet 3  . traZODone (DESYREL) 150 MG tablet TAKE 1 BY MOUTH AT BEDTIME AS NEEDED FOR SLEEP 90 tablet 3   No facility-administered medications prior to visit.     ROS Review of Systems  Constitutional: Negative for activity change, appetite change, chills, diaphoresis, fatigue, fever and unexpected weight change.  HENT: Negative.  Negative for facial swelling, sinus pressure, sore throat, trouble swallowing and voice change.   Eyes: Negative.   Respiratory: Positive  for cough and wheezing. Negative for choking, chest tightness, shortness of breath and stridor.   Cardiovascular: Negative.  Negative for chest pain, palpitations and leg swelling.  Gastrointestinal: Negative for abdominal pain, constipation, diarrhea, nausea and vomiting.  Endocrine: Negative.   Genitourinary: Negative.  Negative for difficulty urinating.  Musculoskeletal: Negative.   Skin: Negative.  Negative for color change and rash.  Allergic/Immunologic: Negative.   Neurological: Negative.  Negative for dizziness.  Hematological: Negative.  Negative for adenopathy. Does not bruise/bleed easily.  Psychiatric/Behavioral: Negative.     Objective:  BP 112/70 (BP Location: Left Arm, Patient Position: Sitting, Cuff Size: Normal)   Pulse 85   Temp 97.7 F (36.5 C) (Oral)   Resp 16   Ht 5\' 2"  (1.575 m)   Wt 121 lb 8 oz (55.1 kg)   SpO2 95%   BMI 22.22 kg/m   BP Readings from Last 3 Encounters:  11/27/15 112/70  10/30/15 122/80  10/23/15 124/80    Wt Readings from Last 3 Encounters:  11/27/15 121 lb 8 oz (55.1 kg)  11/12/15 123 lb 3.2 oz (55.9 kg)  10/30/15 122 lb 8 oz (55.6 kg)    Physical Exam  Constitutional: She is oriented to person, place, and time. No distress.  HENT:  Head: Normocephalic and atraumatic.  Mouth/Throat: Oropharynx is clear and moist. No oropharyngeal exudate.  Eyes:  Conjunctivae are normal. Right eye exhibits no discharge. Left eye exhibits no discharge. No scleral icterus.  Neck: Normal range of motion. Neck supple. No JVD present. No tracheal deviation present. No thyromegaly present.  Cardiovascular: Normal rate, regular rhythm, normal heart sounds and intact distal pulses.  Exam reveals no gallop and no friction rub.   No murmur heard. Pulmonary/Chest: Effort normal and breath sounds normal. No stridor. No respiratory distress. She has no wheezes. She has no rales. She exhibits no tenderness.  Abdominal: Soft. Bowel sounds are normal. She  exhibits no distension and no mass. There is no tenderness. There is no rebound and no guarding.  Musculoskeletal: Normal range of motion. She exhibits no edema, tenderness or deformity.  Lymphadenopathy:    She has no cervical adenopathy.  Neurological: She is oriented to person, place, and time.  Skin: Skin is warm and dry. No rash noted. She is not diaphoretic. No erythema. No pallor.  Psychiatric: She has a normal mood and affect. Her behavior is normal. Judgment and thought content normal.  Vitals reviewed.   Lab Results  Component Value Date   WBC 7.0 08/18/2015   HGB 12.4 08/18/2015   HCT 39.3 08/18/2015   PLT 267 08/18/2015   GLUCOSE 165 (H) 11/05/2015   CHOL 141 11/05/2015   TRIG 205.0 (H) 11/05/2015   HDL 46.70 11/05/2015   LDLDIRECT 72.0 11/05/2015   LDLCALC 70 07/26/2013   ALT 18 11/05/2015   AST 17 11/05/2015   NA 139 11/05/2015   K 4.0 11/05/2015   CL 102 11/05/2015   CREATININE 0.75 11/05/2015   BUN 12 11/05/2015   CO2 29 11/05/2015   TSH 2.00 11/05/2015   HGBA1C 6.4 11/05/2015   MICROALBUR 1.6 04/30/2015    Dg Chest 2 View  Result Date: 11/27/2015 CLINICAL DATA:  Congestion, cough. EXAM: CHEST  2 VIEW COMPARISON:  None. FINDINGS: The heart size and mediastinal contours are within normal limits. Both lungs are clear. No pneumothorax or pleural effusion is noted. The visualized skeletal structures are unremarkable. IMPRESSION: No active cardiopulmonary disease. Electronically Signed   By: Marijo Conception, M.D.   On: 11/27/2015 11:43    Assessment & Plan:   Jill Chandler was seen today for cough.  Diagnoses and all orders for this visit:  Mild intermittent asthma without complication- she will start a LABA/ICS combination to control the wheezing and reduce the inflammation in her airways, I gave her a sample of Dulera and showed her how to use it, she demonstrated proficiency with its use. -     mometasone-formoterol (DULERA) 200-5 MCG/ACT AERO; Inhale 2 puffs into  the lungs 2 (two) times daily.  Cough - her chest x-ray is normal -     promethazine (PHENERGAN) 12.5 MG tablet; Take 1 tablet (12.5 mg total) by mouth every 6 (six) hours as needed for nausea or vomiting. -     DG Chest 2 View; Future  RTI (respiratory tract infection)- I will treat the infection with Omnicef and will control the cough with promethazine -     cefdinir (OMNICEF) 300 MG capsule; Take 1 capsule (300 mg total) by mouth 2 (two) times daily. -     promethazine (PHENERGAN) 12.5 MG tablet; Take 1 tablet (12.5 mg total) by mouth every 6 (six) hours as needed for nausea or vomiting.   I am having Ms. Costin start on mometasone-formoterol, cefdinir, and promethazine. I am also having her maintain her Calcium Carbonate-Vitamin D (CALCIUM + D PO), fish oil-omega-3  fatty acids, ammonium lactate, glucose blood, traZODone, simvastatin, metFORMIN, etodolac, FLUoxetine, omeprazole, buPROPion, and amphetamine-dextroamphetamine.  Meds ordered this encounter  Medications  . mometasone-formoterol (DULERA) 200-5 MCG/ACT AERO    Sig: Inhale 2 puffs into the lungs 2 (two) times daily.    Dispense:  39 g    Refill:  3  . cefdinir (OMNICEF) 300 MG capsule    Sig: Take 1 capsule (300 mg total) by mouth 2 (two) times daily.    Dispense:  20 capsule    Refill:  1  . promethazine (PHENERGAN) 12.5 MG tablet    Sig: Take 1 tablet (12.5 mg total) by mouth every 6 (six) hours as needed for nausea or vomiting.    Dispense:  30 tablet    Refill:  0     Follow-up: Return in about 3 weeks (around 12/18/2015).  Scarlette Calico, MD

## 2015-12-02 ENCOUNTER — Other Ambulatory Visit: Payer: Self-pay | Admitting: Internal Medicine

## 2015-12-02 ENCOUNTER — Encounter: Payer: Self-pay | Admitting: Internal Medicine

## 2015-12-02 ENCOUNTER — Ambulatory Visit (AMBULATORY_SURGERY_CENTER): Payer: Medicare Other | Admitting: Internal Medicine

## 2015-12-02 ENCOUNTER — Telehealth: Payer: Self-pay

## 2015-12-02 VITALS — BP 124/65 | HR 78 | Temp 98.4°F | Resp 13 | Ht 62.0 in | Wt 123.0 lb

## 2015-12-02 DIAGNOSIS — K2270 Barrett's esophagus without dysplasia: Secondary | ICD-10-CM

## 2015-12-02 DIAGNOSIS — R1012 Left upper quadrant pain: Secondary | ICD-10-CM | POA: Diagnosis not present

## 2015-12-02 DIAGNOSIS — F419 Anxiety disorder, unspecified: Secondary | ICD-10-CM | POA: Diagnosis not present

## 2015-12-02 DIAGNOSIS — E119 Type 2 diabetes mellitus without complications: Secondary | ICD-10-CM | POA: Diagnosis not present

## 2015-12-02 DIAGNOSIS — R1031 Right lower quadrant pain: Secondary | ICD-10-CM | POA: Diagnosis not present

## 2015-12-02 DIAGNOSIS — F32A Depression, unspecified: Secondary | ICD-10-CM

## 2015-12-02 DIAGNOSIS — F329 Major depressive disorder, single episode, unspecified: Secondary | ICD-10-CM

## 2015-12-02 HISTORY — PX: ESOPHAGOGASTRODUODENOSCOPY: SHX1529

## 2015-12-02 LAB — GLUCOSE, CAPILLARY
Glucose-Capillary: 97 mg/dL (ref 65–99)
Glucose-Capillary: 81 mg/dL (ref 65–99)

## 2015-12-02 MED ORDER — TRAZODONE HCL 150 MG PO TABS: ORAL_TABLET | ORAL | 3 refills | Status: DC

## 2015-12-02 MED ORDER — OMEPRAZOLE 20 MG PO CPDR: 20.0000 mg | DELAYED_RELEASE_CAPSULE | Freq: Every day | ORAL | 1 refills | Status: DC

## 2015-12-02 MED ORDER — SODIUM CHLORIDE 0.9 % IV SOLN: 500.0000 mL | INTRAVENOUS | Status: DC

## 2015-12-02 NOTE — Progress Notes (Signed)
Called to room to assist during endoscopic procedure.  Patient ID and intended procedure confirmed with present staff. Received instructions for my participation in the procedure from the performing physician.  

## 2015-12-02 NOTE — Op Note (Signed)
[place] Patient Name: Jill Chandler Procedure Date: 12/02/2015 3:18 PM MRN: [id number] Endoscopist: Gatha Mayer , MD Age: 70 Referring MD:  Date of Birth: [birth year] Gender: Female Account #: [id number] Procedure:                Upper GI endoscopy Indications:              Abdominal pain in the left upper quadrant Medicines:                Propofol per Anesthesia, Monitored Anesthesia Care Procedure:                Pre-Anesthesia Assessment:                           - Prior to the procedure, a History and Physical                            was performed, and patient medications and                            allergies were reviewed. The patient's tolerance of                            previous anesthesia was also reviewed. The risks                            and benefits of the procedure and the sedation                            options and risks were discussed with the patient.                            All questions were answered, and informed consent                            was obtained. Prior Anticoagulants: The patient has                            taken no previous anticoagulant or antiplatelet                            agents. ASA Grade Assessment: II - A patient with                            mild systemic disease. After reviewing the risks                            and benefits, the patient was deemed in                            satisfactory condition to undergo the procedure.                           After obtaining informed consent, the endoscope was  passed under direct vision. Throughout the                            procedure, the patient's blood pressure, pulse, and                            oxygen saturations were monitored continuously. The                            Model [id number]) scope was introduced                            through the mouth, and advanced to the second part            of duodenum. The upper GI endoscopy was                            accomplished without difficulty. The patient                            tolerated the procedure well. Scope In: Scope Out: Findings:                 There were esophageal mucosal changes secondary to                            established short-segment Barrett's disease present                            in the distal esophagus. The maximum longitudinal                            extent of these mucosal changes was 2 cm in length.                            Mucosa was biopsied with a cold forceps for                            histology at intervals of 2 cm at 33 and 35 cm from                            the incisors. One specimen bottle was sent to                            pathology. Verification of patient identification                            for the specimen was done. Estimated blood loss was                            minimal.                           A 5 cm hiatal hernia was present.  The exam was otherwise without abnormality.                           The cardia and gastric fundus were normal on                            retroflexion. Complications:            No immediate complications. Estimated Blood Loss:     Estimated blood loss was minimal. Impression:               - Esophageal mucosal changes secondary to                            established short-segment Barrett's disease.                            Biopsied.                           - 5 cm hiatal hernia.                           - The examination was otherwise normal. Recommendation:           - Patient has a contact number available for                            emergencies. The signs and symptoms of potential                            delayed complications were discussed with the                            patient. Return to normal activities tomorrow.                            Written discharge  instructions were provided to the                            patient.                           - Resume previous diet.                           - Continue present medications.                           - Await pathology results.                           -                           WE WILL SCHEDULE UPPER GI SERIES TO EVALUATE CHEST                            PAIN,  LUQ PAIN AND HIATAL HERNIA Gatha Mayer, MD 12/02/2015 3:47:00 PM This report has been signed electronically.

## 2015-12-02 NOTE — Progress Notes (Signed)
A/ox3, pleased with MAC, report to RN 

## 2015-12-02 NOTE — Patient Instructions (Addendum)
I saw the hiatal hernia again - and the barrett's esophagus which I biopsied.  My office will schedule an upper GI series to see how much the hiatal hernia is getting squeezed.  I appreciate the opportunity to care for you. Gatha Mayer, MD, FACG      YOU HAD AN ENDOSCOPIC PROCEDURE TODAY AT [facility]:   Refer to the procedure report that was given to you for any specific questions about what was found during the examination.  If the procedure report does not answer your questions, please call your gastroenterologist to clarify.  If you requested that your care partner not be given the details of your procedure findings, then the procedure report has been included in a sealed envelope for you to review at your convenience later.  YOU SHOULD EXPECT: Some feelings of bloating in the abdomen. Passage of more gas than usual.  Walking can help get rid of the air that was put into your GI tract during the procedure and reduce the bloating. If you had a lower endoscopy (such as a colonoscopy or flexible sigmoidoscopy) you may notice spotting of blood in your stool or on the toilet paper. If you underwent a bowel prep for your procedure, you may not have a normal bowel movement for a few days.  Please Note:  You might notice some irritation and congestion in your nose or some drainage.  This is from the oxygen used during your procedure.  There is no need for concern and it should clear up in a day or so.  SYMPTOMS TO REPORT IMMEDIATELY:    Following upper endoscopy (EGD)  Vomiting of blood or coffee ground material  New chest pain or pain under the shoulder blades  Painful or persistently difficult swallowing  New shortness of breath  Fever of 100F or higher  Black, tarry-looking stools  For urgent or emergent issues, a gastroenterologist can be reached at any hour by calling [phone].   DIET:  We do recommend a small meal at first, but then you may  proceed to your regular diet.  Drink plenty of fluids but you should avoid alcoholic beverages for 24 hours.  ACTIVITY:  You should plan to take it easy for the rest of today and you should NOT DRIVE or use heavy machinery until tomorrow (because of the sedation medicines used during the test).    FOLLOW UP: Our staff will call the number listed on your records the next business day following your procedure to check on you and address any questions or concerns that you may have regarding the information given to you following your procedure. If we do not reach you, we will leave a message.  However, if you are feeling well and you are not experiencing any problems, there is no need to return our call.  We will assume that you have returned to your regular daily activities without incident.  If any biopsies were taken you will be contacted by phone or by letter within the next 1-3 weeks.  Please call us at [phone] if you have not heard about the biopsies in 3 weeks.    SIGNATURES/CONFIDENTIALITY: You and/or your care partner have signed paperwork which will be entered into your electronic medical record.  These signatures attest to the fact that that the information above on your After Visit Summary has been reviewed and is understood.  Full responsibility of the confidentiality of this discharge information lies with you and/or  your care-partner.   Resume medications. Information given on Hiatal Hernia.

## 2015-12-02 NOTE — Telephone Encounter (Signed)
[facility] mail order rq rf of Trazadone. Pls advise.   Omeprazole erx'ed.

## 2015-12-03 ENCOUNTER — Telehealth: Payer: Self-pay

## 2015-12-03 ENCOUNTER — Encounter: Payer: Self-pay | Admitting: Internal Medicine

## 2015-12-03 ENCOUNTER — Other Ambulatory Visit: Payer: Self-pay

## 2015-12-03 DIAGNOSIS — K449 Diaphragmatic hernia without obstruction or gangrene: Secondary | ICD-10-CM

## 2015-12-03 DIAGNOSIS — R1012 Left upper quadrant pain: Secondary | ICD-10-CM

## 2015-12-03 DIAGNOSIS — R0789 Other chest pain: Secondary | ICD-10-CM

## 2015-12-03 NOTE — Progress Notes (Signed)
Patient has been scheduled for a UGI at [facility] for 12/16/15 10:15 arrival for 10:30 appt She is notified of the appt details and verbalized understanding to be NPO after midnight

## 2015-12-03 NOTE — Telephone Encounter (Signed)
  Follow up Call-  Call back number 12/02/2015  Post procedure Call Back phone  # [phone]  Permission to leave phone message Yes  Some recent data might be hidden     Patient questions:  Do you have a fever, pain , or abdominal swelling? No. Pain Score  0 *  Have you tolerated food without any problems? Yes.    Have you been able to return to your normal activities? Yes.    Do you have any questions about your discharge instructions: Diet   No. Medications  No. Follow up visit  No.  Do you have questions or concerns about your Care? No.  Actions: * If pain score is 4 or above: No action needed, pain <4.

## 2015-12-06 NOTE — Progress Notes (Signed)
My Chart note so no letter  Intestinal metaplasia - no dysplasia Recall EGD 3-5 years - Barrett's (Recall 3 yrs)  Waiting on upper GI series results

## 2015-12-16 ENCOUNTER — Ambulatory Visit (HOSPITAL_COMMUNITY): Payer: Medicare Other

## 2015-12-30 ENCOUNTER — Ambulatory Visit (HOSPITAL_COMMUNITY)
Admission: RE | Admit: 2015-12-30 | Discharge: 2015-12-30 | Disposition: A | Discharge status: Home / Self Care | Payer: Medicare Other | Source: Ambulatory Visit | Attending: Internal Medicine | Admitting: Internal Medicine

## 2015-12-30 DIAGNOSIS — R0789 Other chest pain: Secondary | ICD-10-CM | POA: Diagnosis not present

## 2015-12-30 DIAGNOSIS — Z981 Arthrodesis status: Secondary | ICD-10-CM | POA: Diagnosis not present

## 2015-12-30 DIAGNOSIS — K449 Diaphragmatic hernia without obstruction or gangrene: Secondary | ICD-10-CM | POA: Diagnosis not present

## 2015-12-30 DIAGNOSIS — R1012 Left upper quadrant pain: Secondary | ICD-10-CM

## 2015-12-30 NOTE — Progress Notes (Signed)
The hiatal hernia is small and does not seem like it would be causing sxs 1) How is epigastric and LUQ pain? 2) She was supposed to see GYN re: left ovarian cyst on Oct CT - has she? - needs to

## 2015-12-31 ENCOUNTER — Encounter: Payer: Self-pay | Admitting: Internal Medicine

## 2016-01-13 DIAGNOSIS — E119 Type 2 diabetes mellitus without complications: Secondary | ICD-10-CM | POA: Diagnosis not present

## 2016-01-13 DIAGNOSIS — H35342 Macular cyst, hole, or pseudohole, left eye: Secondary | ICD-10-CM | POA: Diagnosis not present

## 2016-01-13 DIAGNOSIS — Z961 Presence of intraocular lens: Secondary | ICD-10-CM | POA: Diagnosis not present

## 2016-01-13 LAB — HM DIABETES EYE EXAM

## 2016-01-17 ENCOUNTER — Encounter: Payer: Self-pay | Admitting: Internal Medicine

## 2016-01-17 NOTE — Progress Notes (Signed)
Abstracted result and sent to scan  

## 2016-01-27 DIAGNOSIS — D279 Benign neoplasm of unspecified ovary: Secondary | ICD-10-CM

## 2016-01-27 HISTORY — DX: Benign neoplasm of unspecified ovary: D27.9

## 2016-01-28 ENCOUNTER — Encounter: Payer: Self-pay | Admitting: Endocrinology

## 2016-01-28 DIAGNOSIS — N903 Dysplasia of vulva, unspecified: Secondary | ICD-10-CM | POA: Diagnosis not present

## 2016-01-28 DIAGNOSIS — Z124 Encounter for screening for malignant neoplasm of cervix: Secondary | ICD-10-CM | POA: Diagnosis not present

## 2016-01-28 DIAGNOSIS — N83202 Unspecified ovarian cyst, left side: Secondary | ICD-10-CM | POA: Diagnosis not present

## 2016-01-28 DIAGNOSIS — Z01411 Encounter for gynecological examination (general) (routine) with abnormal findings: Secondary | ICD-10-CM | POA: Diagnosis not present

## 2016-01-28 LAB — HM PAP SMEAR

## 2016-02-04 ENCOUNTER — Ambulatory Visit: Payer: Medicare Other | Admitting: Endocrinology

## 2016-02-15 ENCOUNTER — Encounter: Payer: Self-pay | Admitting: Internal Medicine

## 2016-03-11 ENCOUNTER — Other Ambulatory Visit: Payer: Self-pay | Admitting: Internal Medicine

## 2016-03-11 ENCOUNTER — Encounter: Payer: Self-pay | Admitting: Internal Medicine

## 2016-03-11 DIAGNOSIS — F902 Attention-deficit hyperactivity disorder, combined type: Secondary | ICD-10-CM

## 2016-03-11 MED ORDER — AMPHETAMINE-DEXTROAMPHETAMINE 20 MG PO TABS: 20.0000 mg | ORAL_TABLET | Freq: Two times a day (BID) | ORAL | 0 refills | Status: DC

## 2016-03-11 MED ORDER — GLUCOSE BLOOD VI STRP: ORAL_STRIP | 1 refills | Status: DC

## 2016-03-11 NOTE — Telephone Encounter (Signed)
[facility]. [facility] [address]. [facility]. Pt also states she is needing her Adderrall refill...Johny Chess

## 2016-04-13 ENCOUNTER — Ambulatory Visit (INDEPENDENT_AMBULATORY_CARE_PROVIDER_SITE_OTHER): Payer: Medicare Other | Admitting: Endocrinology

## 2016-04-13 ENCOUNTER — Encounter: Payer: Self-pay | Admitting: Endocrinology

## 2016-04-13 VITALS — BP 132/80 | HR 98 | Ht 62.0 in | Wt 120.0 lb

## 2016-04-13 DIAGNOSIS — M810 Age-related osteoporosis without current pathological fracture: Secondary | ICD-10-CM

## 2016-04-13 LAB — VITAMIN D 25 HYDROXY (VIT D DEFICIENCY, FRACTURES): VITD: 47.69 ng/mL (ref 30.00–100.00)

## 2016-04-13 MED ORDER — IBANDRONATE SODIUM 150 MG PO TABS: 150.0000 mg | ORAL_TABLET | ORAL | 3 refills | Status: DC

## 2016-04-13 NOTE — Progress Notes (Signed)
Subjective:    Patient ID: Jill Chandler, female    DOB: 1945-08-25, 71 y.o.   MRN: [id number]  HPI Pt return for f/u of osteoporosis (dx'ed 2002; no secondary cause was found; she has never been on medication for this; she has never had bony fracture; she had 1st reclast infusion in early 2017; she takes OTC vit-D and Ca++ supplements).  She says the night after the infusion, she had few days of moderate lightheadedness sensation in the head, but assoc LOC.   Past Medical History:  Diagnosis Date  . ADHD (attention deficit hyperactivity disorder)   . Anemia   . Barrett's esophagus 03/01/2013  . 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 Cervical dysplasia   . Colon polyps 02/24/2013   Tubular adenoma polyps  . Depression   . Diabetes mellitus without complication ([place])   . Fibroid   . GERD (gastroesophageal reflux disease)   . Headache(784.0)   . Hyperlipidemia   . Personal history of colonic adenoma 06/14/2006  . 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 VIN III (vulvar intraepithelial neoplasia III)    1990's per patient    Past Surgical History:  Procedure Laterality Date  . BREAST SURGERY     BREAST REDUCTION   . CERVICAL FUSION    . CERVICAL LAMINECTOMY    . COLONOSCOPY    . LEEP     Cervix at younger age for dysplasia  . OVARIAN CYST REMOVAL    . VULVECTOMY PARTIAL      Social History   Social History  . Marital status: Single    Spouse name: N/A  . Number of children: N/A  . Years of education: N/A   Occupational History  . Not on file.   Social History Main Topics  . Smoking status: Former Smoker    Quit date: 09/07/1998  . Smokeless tobacco: Never Used  . Alcohol use Yes     Comment: Occasionally  . Drug use: No  . Sexual activity: Yes    Birth control/ protection: Post-menopausal   Other Topics Concern  . Not on file   Social History Narrative   The patient is divorced, she is a mental health and addiction  therapist. No children. 3 caffeinated beverages daily.          Current Outpatient Prescriptions on File Prior to Visit  Medication Sig Dispense Refill  . ammonium lactate (LAC-HYDRIN) 12 % cream Apply topically as needed for dry skin. 385 g 0  . amphetamine-dextroamphetamine (ADDERALL) 20 MG tablet Take 1 tablet (20 mg total) by mouth 2 (two) times daily. 180 tablet 0  . buPROPion (WELLBUTRIN XL) 150 MG 24 hr tablet Take 1 tablet (150 mg total) by mouth every morning. 90 tablet 3  . Calcium Carbonate-Vitamin D (CALCIUM + D PO) Take by mouth.      . etodolac (LODINE) 200 MG capsule Take 1 capsule (200 mg total) by mouth 2 (two) times daily. 60 capsule 3  . fish oil-omega-3 fatty acids 1000 MG capsule Take 2 g by mouth daily.      Marland Kitchen FLUoxetine (PROZAC) 40 MG capsule TAKE 1 BY MOUTH TWICE DAILY 180 capsule 2  . glucose blood (ONETOUCH VERIO) test strip Use to check blood sugar twice a day DX:E11.8 200 each 1  . metFORMIN (GLUCOPHAGE-XR) 750 MG 24 hr tablet Take 1 tablet (750 mg total) by mouth daily with breakfast.  90 tablet 3  . omeprazole (PRILOSEC) 20 MG capsule Take 1 capsule (20 mg total) by mouth daily. 90 capsule 1  . promethazine (PHENERGAN) 12.5 MG tablet Take 1 tablet (12.5 mg total) by mouth every 6 (six) hours as needed for nausea or vomiting. 30 tablet 0  . simvastatin (ZOCOR) 40 MG tablet TAKE 1 BY MOUTH AT BEDTIME 90 tablet 3  . traZODone (DESYREL) 150 MG tablet TAKE 1 BY MOUTH AT BEDTIME AS NEEDED FOR SLEEP 90 tablet 3   Current Facility-Administered Medications on File Prior to Visit  Medication Dose Route Frequency Provider Last Rate Last Dose  . 0.9 %  sodium chloride infusion  500 mL Intravenous Continuous Gatha Mayer, MD        No Known Allergies  Family History  Problem Relation Age of Onset  . Breast cancer Sister 89  . Diabetes Sister   . Hypertension Sister   . Breast cancer Sister 5  . Alcohol abuse Other   . Drug abuse Other   . Hypertension Other   .  Hyperlipidemia Other   . Heart disease Mother   . Osteoporosis Sister   . Cancer Neg Hx   . Early death Neg Hx   . Hearing loss Neg Hx   . Kidney disease Neg Hx   . Learning disabilities Neg Hx   . Stroke Neg Hx   . Colon cancer Neg Hx     BP 132/80   Pulse 98   Ht 5\' 2"  (1.575 m)   Wt 120 lb (54.4 kg)   SpO2 96%   BMI 21.95 kg/m    Review of Systems No recent falls.  Denies heartburn since on prilosec    Objective:   Physical Exam VITAL SIGNS:  See vs page GENERAL: no distress Gait: normal and steady   Lab Results  Component Value Date   TSH 2.00 11/05/2015   Lab Results  Component Value Date   CREATININE 0.75 11/05/2015   BUN 12 11/05/2015   NA 139 11/05/2015   K 4.0 11/05/2015   CL 102 11/05/2015   CO2 29 11/05/2015      Assessment & Plan:  Osteoporosis, due for recheck. Dizziness, new, uncertain of related to reclast.  GERD: she'll need to take extra prilosec for each dose of boniva.  Patient is advised the following: Patient Instructions  I have sent a prescription to your pharmacy, for "boniva," once per month.   Take an extra prilosec the day before each pill.   Take calcium 1200 mg per day, and vitamin-D, 400 units per day.  blood tests are requested for you today.  We'll let you know about the results.  Please return in 1 year.

## 2016-04-13 NOTE — Patient Instructions (Addendum)
I have sent a prescription to your pharmacy, for "boniva," once per month.   Take an extra prilosec the day before each pill.   Take calcium 1200 mg per day, and vitamin-D, 400 units per day.  blood tests are requested for you today.  We'll let you know about the results.  Please return in 1 year.

## 2016-04-14 LAB — PTH, INTACT AND CALCIUM
Calcium: 9.5 mg/dL (ref 8.6–10.4)
PTH: 48 pg/mL (ref 14–64)

## 2016-04-18 ENCOUNTER — Encounter: Payer: Self-pay | Admitting: Endocrinology

## 2016-04-20 ENCOUNTER — Ambulatory Visit (INDEPENDENT_AMBULATORY_CARE_PROVIDER_SITE_OTHER)
Admission: RE | Admit: 2016-04-20 | Discharge: 2016-04-20 | Disposition: A | Discharge status: Home / Self Care | Payer: Medicare Other | Source: Ambulatory Visit | Attending: Endocrinology | Admitting: Endocrinology

## 2016-04-20 DIAGNOSIS — M810 Age-related osteoporosis without current pathological fracture: Secondary | ICD-10-CM | POA: Diagnosis not present

## 2016-04-21 ENCOUNTER — Telehealth: Payer: Self-pay | Admitting: Internal Medicine

## 2016-04-21 ENCOUNTER — Other Ambulatory Visit: Payer: Self-pay

## 2016-04-21 ENCOUNTER — Other Ambulatory Visit: Payer: Medicare Other

## 2016-04-21 DIAGNOSIS — N903 Dysplasia of vulva, unspecified: Secondary | ICD-10-CM | POA: Insufficient documentation

## 2016-04-21 DIAGNOSIS — N83202 Unspecified ovarian cyst, left side: Secondary | ICD-10-CM | POA: Diagnosis not present

## 2016-04-21 DIAGNOSIS — R1909 Other intra-abdominal and pelvic swelling, mass and lump: Secondary | ICD-10-CM | POA: Diagnosis not present

## 2016-04-21 DIAGNOSIS — N838 Other noninflammatory disorders of ovary, fallopian tube and broad ligament: Secondary | ICD-10-CM | POA: Insufficient documentation

## 2016-04-21 DIAGNOSIS — D251 Intramural leiomyoma of uterus: Secondary | ICD-10-CM | POA: Diagnosis not present

## 2016-04-21 MED ORDER — IBANDRONATE SODIUM 150 MG PO TABS: 150.0000 mg | ORAL_TABLET | ORAL | 3 refills | Status: DC

## 2016-04-21 NOTE — Telephone Encounter (Signed)
Needs 4 prescriptions approved for refills ,   omeprazole simvastatin metFORMIN FLUoxetine  Please advise.

## 2016-04-21 NOTE — Telephone Encounter (Signed)
LOV was 11/2015. No future appts.   Please advise on refills.

## 2016-04-22 ENCOUNTER — Other Ambulatory Visit: Payer: Self-pay

## 2016-04-22 NOTE — Telephone Encounter (Signed)
Called and LVM for patient to call back. No answer on all numbers.

## 2016-04-22 NOTE — Telephone Encounter (Signed)
She is overdue for an OV with A1C

## 2016-04-22 NOTE — Telephone Encounter (Signed)
Will you call pt please

## 2016-04-26 ENCOUNTER — Encounter: Payer: Self-pay | Admitting: Internal Medicine

## 2016-05-04 ENCOUNTER — Ambulatory Visit (INDEPENDENT_AMBULATORY_CARE_PROVIDER_SITE_OTHER): Payer: Medicare Other | Admitting: Internal Medicine

## 2016-05-04 ENCOUNTER — Encounter: Payer: Self-pay | Admitting: Internal Medicine

## 2016-05-04 VITALS — BP 110/78 | HR 86 | Temp 97.7°F | Resp 16 | Ht 62.0 in | Wt 119.0 lb

## 2016-05-04 DIAGNOSIS — E118 Type 2 diabetes mellitus with unspecified complications: Secondary | ICD-10-CM | POA: Diagnosis not present

## 2016-05-04 NOTE — Progress Notes (Signed)
Pre visit review using our clinic review tool, if applicable. No additional management support is needed unless otherwise documented below in the visit note. 

## 2016-05-04 NOTE — Patient Instructions (Signed)

## 2016-05-04 NOTE — Progress Notes (Signed)
Subjective:  Patient ID: JADEA SHIFFER, female    DOB: 02/17/1945  Age: 71 y.o. MRN: [id number]  CC: Diabetes   HPI DOCIA KLAR presents for f/up - she feels well and offers no new complaints.  Outpatient Medications Prior to Visit  Medication Sig Dispense Refill  . ammonium lactate (LAC-HYDRIN) 12 % cream Apply topically as needed for dry skin. 385 g 0  . amphetamine-dextroamphetamine (ADDERALL) 20 MG tablet Take 1 tablet (20 mg total) by mouth 2 (two) times daily. 180 tablet 0  . buPROPion (WELLBUTRIN XL) 150 MG 24 hr tablet Take 1 tablet (150 mg total) by mouth every morning. 90 tablet 3  . Calcium Carbonate-Vitamin D (CALCIUM + D PO) Take by mouth.      . etodolac (LODINE) 200 MG capsule Take 1 capsule (200 mg total) by mouth 2 (two) times daily. 60 capsule 3  . fish oil-omega-3 fatty acids 1000 MG capsule Take 2 g by mouth daily.      Marland Kitchen FLUoxetine (PROZAC) 40 MG capsule TAKE 1 BY MOUTH TWICE DAILY 180 capsule 2  . glucose blood (ONETOUCH VERIO) test strip Use to check blood sugar twice a day DX:E11.8 200 each 1  . ibandronate (BONIVA) 150 MG tablet Take 1 tablet (150 mg total) by mouth every 30 (thirty) days. Take in the morning with a full glass of water, on an empty stomach, and do not take anything else by mouth or lie down for the next 30 min. 3 tablet 3  . metFORMIN (GLUCOPHAGE-XR) 750 MG 24 hr tablet Take 1 tablet (750 mg total) by mouth daily with breakfast. 90 tablet 3  . omeprazole (PRILOSEC) 20 MG capsule Take 1 capsule (20 mg total) by mouth daily. 90 capsule 1  . simvastatin (ZOCOR) 40 MG tablet TAKE 1 BY MOUTH AT BEDTIME 90 tablet 3  . traZODone (DESYREL) 150 MG tablet TAKE 1 BY MOUTH AT BEDTIME AS NEEDED FOR SLEEP 90 tablet 3  . promethazine (PHENERGAN) 12.5 MG tablet Take 1 tablet (12.5 mg total) by mouth every 6 (six) hours as needed for nausea or vomiting. 30 tablet 0   Facility-Administered Medications Prior to Visit  Medication Dose Route Frequency Provider Last  Rate Last Dose  . 0.9 %  sodium chloride infusion  500 mL Intravenous Continuous Gatha Mayer, MD        ROS Review of Systems  Constitutional: Negative.  Negative for appetite change, chills, fatigue and unexpected weight change.  HENT: Negative.  Negative for sinus pressure and trouble swallowing.   Eyes: Negative for visual disturbance.  Respiratory: Negative.  Negative for cough, chest tightness, shortness of breath and wheezing.   Cardiovascular: Negative.  Negative for chest pain, palpitations and leg swelling.  Gastrointestinal: Negative for abdominal pain, diarrhea, nausea and vomiting.  Endocrine: Negative.  Negative for cold intolerance, heat intolerance, polydipsia, polyphagia and polyuria.  Genitourinary: Negative.  Negative for difficulty urinating.  Musculoskeletal: Negative.  Negative for back pain and neck pain.  Skin: Negative.   Allergic/Immunologic: Negative.   Neurological: Negative.  Negative for dizziness, weakness and headaches.  Hematological: Negative for adenopathy. Does not bruise/bleed easily.  Psychiatric/Behavioral: Positive for decreased concentration. Negative for dysphoric mood, self-injury, sleep disturbance and suicidal ideas. The patient is not nervous/anxious.     Objective:  BP 110/78 (BP Location: Left Arm, Patient Position: Sitting, Cuff Size: Normal)   Pulse 86   Temp 97.7 F (36.5 C) (Oral)   Ht 5\' 2"  (1.575 m)  Wt 119 lb (54 kg)   SpO2 96%   BMI 21.77 kg/m   BP Readings from Last 3 Encounters:  05/04/16 110/78  04/13/16 132/80  12/02/15 124/65    Wt Readings from Last 3 Encounters:  05/04/16 119 lb (54 kg)  04/13/16 120 lb (54.4 kg)  12/02/15 123 lb (55.8 kg)    Physical Exam  Constitutional: She is oriented to person, place, and time. No distress.  HENT:  Mouth/Throat: Oropharynx is clear and moist. No oropharyngeal exudate.  Eyes: Conjunctivae are normal. Right eye exhibits no discharge. Left eye exhibits no discharge.  No scleral icterus.  Neck: Normal range of motion. Neck supple. No JVD present. No tracheal deviation present. No thyromegaly present.  Cardiovascular: Normal rate, regular rhythm, normal heart sounds and intact distal pulses.  Exam reveals no gallop and no friction rub.   No murmur heard. Pulmonary/Chest: Effort normal and breath sounds normal. No stridor. No respiratory distress. She has no wheezes. She has no rales. She exhibits no tenderness.  Abdominal: Soft. Bowel sounds are normal. She exhibits no distension and no mass. There is no tenderness. There is no rebound and no guarding.  Musculoskeletal: Normal range of motion. She exhibits no edema, tenderness or deformity.  Lymphadenopathy:    She has no cervical adenopathy.  Neurological: She is oriented to person, place, and time.  Skin: Skin is warm and dry. No rash noted. She is not diaphoretic. No erythema. No pallor.  Psychiatric: She has a normal mood and affect. Her behavior is normal. Judgment and thought content normal.  Vitals reviewed.   Lab Results  Component Value Date   WBC 7.0 08/18/2015   HGB 12.4 08/18/2015   HCT 39.3 08/18/2015   PLT 267 08/18/2015   GLUCOSE 92 05/05/2016   CHOL 141 11/05/2015   TRIG 205.0 (H) 11/05/2015   HDL 46.70 11/05/2015   LDLDIRECT 72.0 11/05/2015   LDLCALC 70 07/26/2013   ALT 18 11/05/2015   AST 17 11/05/2015   NA 140 05/05/2016   K 3.8 05/05/2016   CL 104 05/05/2016   CREATININE 0.72 05/05/2016   BUN 11 05/05/2016   CO2 29 05/05/2016   TSH 2.00 11/05/2015   HGBA1C 6.4 05/05/2016   MICROALBUR 1.4 05/05/2016    Dg Bone Density  Result Date: 04/20/2016 Date of study: 04/20/2016 Exam: DUAL X-RAY ABSORPTIOMETRY (DXA) FOR BONE MINERAL DENSITY (BMD) Instrument: [employer] Requesting Provider: Dr. Loanne Drilling Indication: follow up for osteoporosis Comparison: none (please note that it is not possible to compare data from different instruments) Clinical data: Pt is a 71 y.o. female  without previous history of fracture. On calcium and vitamin D. Had one dose of Reclast in 2017. Started Boniva 1 week prior to DEXA scan. Results:  Lumbar spine (L1-L4) Femoral neck (FN) T-score -0.2  RFN:-2.3 LFN:-1.9 Assessment: the BMD is low according to the [facility] classification for osteoporosis (see below). Fracture risk: moderate FRAX score: 10 year major osteoporotic risk: 12.6%. 10 year hip fracture risk:2.9%. These are under the thresholds for treatment of 20% and 3%, respectively. Comments: the technical quality of the study is good. Evaluation for secondary causes should be considered if clinically indicated. Recommend optimizing calcium (1200 mg/day) and vitamin D (800 IU/day) intake. Followup: Repeat BMD is appropriate after 1-2 years as she just started Boniva. WHO criteria for diagnosis of osteoporosis in postmenopausal women and in men 62 y/o or older: - normal: T-score -1.0 to + 1.0 - osteopenia/low bone density: T-score between -2.5  and -1.0 - osteoporosis: T-score below -2.5 - severe osteoporosis: T-score below -2.5 with history of fragility fracture Note: although not part of the WHO classification, the presence of a fragility fracture, regardless of the T-score, should be considered diagnostic of osteoporosis, provided other causes for the fracture have been excluded. Treatment: The National Osteoporosis Foundation recommends that treatment be considered in postmenopausal women and men age 61 or older with: 1. Hip or vertebral (clinical or morphometric) fracture 2. T-score of - 2.5 or lower at the spine or hip 3. 10-year fracture probability by FRAX of at least 20% for a major osteoporotic fracture and 3% for a hip fracture Philemon Kingdom, MD [place] [facility]    Assessment & Plan:   Omari was seen today for diabetes.  Diagnoses and all orders for this visit:  Type 2 diabetes mellitus with complication, without long-term current use of insulin ([place])- her A1C is 6.4%, her blood  sugars are adequately well controlled on the current dose of metformin -     Basic metabolic panel; Future -     Hemoglobin A1c; Future -     Microalbumin / creatinine urine ratio; Future -     Urinalysis, Routine w reflex microscopic; Future   I have discontinued Ms. Abbasi promethazine. I am also having her maintain her Calcium Carbonate-Vitamin D (CALCIUM + D PO), fish oil-omega-3 fatty acids, ammonium lactate, simvastatin, metFORMIN, etodolac, FLUoxetine, buPROPion, omeprazole, traZODone, amphetamine-dextroamphetamine, glucose blood, and ibandronate. We will continue to administer sodium chloride.  No orders of the defined types were placed in this encounter.    Follow-up: Return in about 4 months (around 09/03/2016).  Scarlette Calico, MD

## 2016-05-05 ENCOUNTER — Other Ambulatory Visit (INDEPENDENT_AMBULATORY_CARE_PROVIDER_SITE_OTHER): Payer: Medicare Other

## 2016-05-05 DIAGNOSIS — E118 Type 2 diabetes mellitus with unspecified complications: Secondary | ICD-10-CM

## 2016-05-05 LAB — URINALYSIS, ROUTINE W REFLEX MICROSCOPIC
BILIRUBIN URINE: NEGATIVE
HGB URINE DIPSTICK: NEGATIVE
Leukocytes, UA: NEGATIVE
Nitrite: NEGATIVE
PH: 7.5 (ref 5.0–8.0)
RBC / HPF: NONE SEEN (ref 0–?)
Specific Gravity, Urine: 1.02 (ref 1.000–1.030)
Urine Glucose: NEGATIVE
Urobilinogen, UA: 1 (ref 0.0–1.0)
WBC, UA: NONE SEEN (ref 0–?)

## 2016-05-05 LAB — BASIC METABOLIC PANEL
BUN: 11 mg/dL (ref 6–23)
CALCIUM: 9.4 mg/dL (ref 8.4–10.5)
CO2: 29 mEq/L (ref 19–32)
Chloride: 104 mEq/L (ref 96–112)
Creatinine, Ser: 0.72 mg/dL (ref 0.40–1.20)
GFR: 85.45 mL/min (ref >60.00)
Glucose, Bld: 92 mg/dL (ref 70–99)
POTASSIUM: 3.8 meq/L (ref 3.5–5.1)
Sodium: 140 mEq/L (ref 135–145)

## 2016-05-05 LAB — HEMOGLOBIN A1C: Hgb A1c MFr Bld: 6.4 % (ref 4.6–6.5)

## 2016-05-05 LAB — MICROALBUMIN / CREATININE URINE RATIO
CREATININE, U: 173 mg/dL
MICROALB/CREAT RATIO: 0.8 mg/g (ref 0.0–30.0)
Microalb, Ur: 1.4 mg/dL (ref 0.0–1.9)

## 2016-05-06 ENCOUNTER — Encounter: Payer: Self-pay | Admitting: Internal Medicine

## 2016-06-01 ENCOUNTER — Encounter: Payer: Self-pay | Admitting: Internal Medicine

## 2016-06-01 ENCOUNTER — Other Ambulatory Visit: Payer: Self-pay | Admitting: Internal Medicine

## 2016-06-01 DIAGNOSIS — F902 Attention-deficit hyperactivity disorder, combined type: Secondary | ICD-10-CM

## 2016-06-01 DIAGNOSIS — E118 Type 2 diabetes mellitus with unspecified complications: Secondary | ICD-10-CM

## 2016-06-01 MED ORDER — FLUOXETINE HCL 40 MG PO CAPS: ORAL_CAPSULE | ORAL | 2 refills | Status: DC

## 2016-06-01 MED ORDER — AMPHETAMINE-DEXTROAMPHETAMINE 20 MG PO TABS: 20.0000 mg | ORAL_TABLET | Freq: Two times a day (BID) | ORAL | 0 refills | Status: DC

## 2016-06-01 MED ORDER — SIMVASTATIN 40 MG PO TABS: ORAL_TABLET | ORAL | 3 refills | Status: DC

## 2016-06-01 MED ORDER — METFORMIN HCL ER 750 MG PO TB24: 750.0000 mg | ORAL_TABLET | Freq: Every day | ORAL | 3 refills | Status: DC

## 2016-06-10 ENCOUNTER — Encounter: Payer: Self-pay | Admitting: Gynecology

## 2016-08-24 DIAGNOSIS — D251 Intramural leiomyoma of uterus: Secondary | ICD-10-CM | POA: Diagnosis not present

## 2016-08-24 DIAGNOSIS — N83202 Unspecified ovarian cyst, left side: Secondary | ICD-10-CM | POA: Diagnosis not present

## 2016-08-28 DIAGNOSIS — R1909 Other intra-abdominal and pelvic swelling, mass and lump: Secondary | ICD-10-CM | POA: Diagnosis not present

## 2016-09-11 ENCOUNTER — Telehealth: Payer: Self-pay

## 2016-09-11 MED ORDER — GLUCOSE BLOOD VI STRP: ORAL_STRIP | 1 refills | Status: DC

## 2016-09-11 NOTE — Telephone Encounter (Signed)
erx sent for test strips.

## 2016-09-21 ENCOUNTER — Ambulatory Visit (INDEPENDENT_AMBULATORY_CARE_PROVIDER_SITE_OTHER): Payer: Medicare Other | Admitting: Internal Medicine

## 2016-09-21 ENCOUNTER — Other Ambulatory Visit (INDEPENDENT_AMBULATORY_CARE_PROVIDER_SITE_OTHER): Payer: Medicare Other

## 2016-09-21 ENCOUNTER — Other Ambulatory Visit: Payer: Self-pay | Admitting: Internal Medicine

## 2016-09-21 ENCOUNTER — Encounter: Payer: Self-pay | Admitting: Internal Medicine

## 2016-09-21 VITALS — BP 130/70 | HR 87 | Temp 98.2°F | Resp 16 | Ht 62.0 in | Wt 118.0 lb

## 2016-09-21 DIAGNOSIS — L719 Rosacea, unspecified: Secondary | ICD-10-CM | POA: Insufficient documentation

## 2016-09-21 DIAGNOSIS — D539 Nutritional anemia, unspecified: Secondary | ICD-10-CM | POA: Diagnosis not present

## 2016-09-21 DIAGNOSIS — F902 Attention-deficit hyperactivity disorder, combined type: Secondary | ICD-10-CM

## 2016-09-21 DIAGNOSIS — E785 Hyperlipidemia, unspecified: Secondary | ICD-10-CM

## 2016-09-21 DIAGNOSIS — F900 Attention-deficit hyperactivity disorder, predominantly inattentive type: Secondary | ICD-10-CM | POA: Diagnosis not present

## 2016-09-21 DIAGNOSIS — M720 Palmar fascial fibromatosis [Dupuytren]: Secondary | ICD-10-CM | POA: Diagnosis not present

## 2016-09-21 DIAGNOSIS — E118 Type 2 diabetes mellitus with unspecified complications: Secondary | ICD-10-CM

## 2016-09-21 DIAGNOSIS — Z23 Encounter for immunization: Secondary | ICD-10-CM | POA: Diagnosis not present

## 2016-09-21 DIAGNOSIS — H52223 Regular astigmatism, bilateral: Secondary | ICD-10-CM | POA: Diagnosis not present

## 2016-09-21 LAB — CBC WITH DIFFERENTIAL/PLATELET
BASOS PCT: 1.1 % (ref 0.0–3.0)
Basophils Absolute: 0.1 10*3/uL (ref 0.0–0.1)
EOS PCT: 1.5 % (ref 0.0–5.0)
Eosinophils Absolute: 0.1 10*3/uL (ref 0.0–0.7)
HEMATOCRIT: 35.8 % — AB (ref 36.0–46.0)
HEMOGLOBIN: 11.3 g/dL — AB (ref 12.0–15.0)
LYMPHS PCT: 16.6 % (ref 12.0–46.0)
Lymphs Abs: 1.3 10*3/uL (ref 0.7–4.0)
MCHC: 31.6 g/dL (ref 30.0–36.0)
MCV: 66.7 fl — ABNORMAL LOW (ref 78.0–100.0)
Monocytes Absolute: 0.7 10*3/uL (ref 0.1–1.0)
Monocytes Relative: 8.4 % (ref 3.0–12.0)
Neutro Abs: 5.8 10*3/uL (ref 1.4–7.7)
Neutrophils Relative %: 72.4 % (ref 43.0–77.0)
Platelets: 354 10*3/uL (ref 150.0–400.0)
RBC: 5.36 Mil/uL — AB (ref 3.87–5.11)
RDW: 19 % — AB (ref 11.5–15.5)
WBC: 8 10*3/uL (ref 4.0–10.5)

## 2016-09-21 LAB — COMPREHENSIVE METABOLIC PANEL
ALT: 11 U/L (ref 0–35)
AST: 14 U/L (ref 0–37)
Albumin: 4.6 g/dL (ref 3.5–5.2)
Alkaline Phosphatase: 53 U/L (ref 39–117)
BUN: 9 mg/dL (ref 6–23)
CO2: 31 meq/L (ref 19–32)
Calcium: 9.7 mg/dL (ref 8.4–10.5)
Chloride: 100 mEq/L (ref 96–112)
Creatinine, Ser: 0.68 mg/dL (ref 0.40–1.20)
GFR: 91.18 mL/min (ref >60.00)
GLUCOSE: 92 mg/dL (ref 70–99)
Potassium: 4.1 mEq/L (ref 3.5–5.1)
SODIUM: 136 meq/L (ref 135–145)
TOTAL PROTEIN: 7.1 g/dL (ref 6.0–8.3)
Total Bilirubin: 0.3 mg/dL (ref 0.2–1.2)

## 2016-09-21 LAB — LIPID PANEL
CHOL/HDL RATIO: 3
Cholesterol: 134 mg/dL (ref 0–200)
HDL: 48.7 mg/dL (ref >39.00)
LDL Cholesterol: 60 mg/dL (ref 0–99)
NONHDL: 85.79
Triglycerides: 128 mg/dL (ref 0.0–149.0)
VLDL: 25.6 mg/dL (ref 0.0–40.0)

## 2016-09-21 LAB — HM DIABETES EYE EXAM

## 2016-09-21 LAB — HEMOGLOBIN A1C: Hgb A1c MFr Bld: 6.4 % (ref 4.6–6.5)

## 2016-09-21 MED ORDER — METRONIDAZOLE 0.75 % EX GEL: 1.0000 "application " | Freq: Two times a day (BID) | CUTANEOUS | 5 refills | Status: DC

## 2016-09-21 MED ORDER — AMPHETAMINE-DEXTROAMPHETAMINE 20 MG PO TABS: 20.0000 mg | ORAL_TABLET | Freq: Two times a day (BID) | ORAL | 0 refills | Status: DC

## 2016-09-21 NOTE — Patient Instructions (Signed)

## 2016-09-21 NOTE — Progress Notes (Signed)
Subjective:  Patient ID: Jill Chandler, female    DOB: 1945-02-22  Age: 71 y.o. MRN: [id number]  CC: Diabetes; Hyperlipidemia; and Anemia   HPI Jill Chandler presents for f/up - She complains of intermittent facial rash that she has had for many years. Her dermatologist told her it's rosacea acne and treated her with topical metronidazole. Her dermatologist doesn't accept Medicare so she is asking me to prescribe the metronidazole. She also complains of worsening left hand pain over the last few months. She has some lesions on the palm of her hand that she thinks are cysts. She is also had a few episodes of soreness and dryness of her tongue.  Pt states ADD status overall stable on current meds with overall good compliance and tolerability, and good effectiveness with respect to ability for concentration and task completion.  Outpatient Medications Prior to Visit  Medication Sig Dispense Refill  . buPROPion (WELLBUTRIN XL) 150 MG 24 hr tablet Take 1 tablet (150 mg total) by mouth every morning. 90 tablet 3  . Calcium Carbonate-Vitamin D (CALCIUM + D PO) Take by mouth.      . etodolac (LODINE) 200 MG capsule Take 1 capsule (200 mg total) by mouth 2 (two) times daily. 60 capsule 3  . fish oil-omega-3 fatty acids 1000 MG capsule Take 2 g by mouth daily.      Marland Kitchen FLUoxetine (PROZAC) 40 MG capsule TAKE 1 BY MOUTH TWICE DAILY 180 capsule 2  . glucose blood (ONETOUCH VERIO) test strip Use to check blood sugar twice a day DX:E11.8 200 each 1  . ibandronate (BONIVA) 150 MG tablet Take 1 tablet (150 mg total) by mouth every 30 (thirty) days. Take in the morning with a full glass of water, on an empty stomach, and do not take anything else by mouth or lie down for the next 30 min. 3 tablet 3  . metFORMIN (GLUCOPHAGE-XR) 750 MG 24 hr tablet Take 1 tablet (750 mg total) by mouth daily with breakfast. 90 tablet 3  . omeprazole (PRILOSEC) 20 MG capsule Take 1 capsule (20 mg total) by mouth daily. 90 capsule 1    . simvastatin (ZOCOR) 40 MG tablet TAKE 1 BY MOUTH AT BEDTIME 90 tablet 3  . traZODone (DESYREL) 150 MG tablet TAKE 1 BY MOUTH AT BEDTIME AS NEEDED FOR SLEEP 90 tablet 3  . ammonium lactate (LAC-HYDRIN) 12 % cream Apply topically as needed for dry skin. 385 g 0  . amphetamine-dextroamphetamine (ADDERALL) 20 MG tablet Take 1 tablet (20 mg total) by mouth 2 (two) times daily. 180 tablet 0   No facility-administered medications prior to visit.     ROS Review of Systems  Constitutional: Negative for appetite change, chills, fatigue, fever and unexpected weight change.  HENT: Negative.  Negative for trouble swallowing.   Eyes: Negative.   Respiratory: Negative.  Negative for cough, chest tightness, shortness of breath and wheezing.   Cardiovascular: Negative for chest pain, palpitations and leg swelling.  Gastrointestinal: Negative for abdominal pain, constipation, diarrhea, nausea and vomiting.  Endocrine: Negative.  Negative for polydipsia, polyphagia and polyuria.  Genitourinary: Negative.  Negative for difficulty urinating, dysuria, hematuria, vaginal bleeding and vaginal discharge.  Musculoskeletal: Negative for arthralgias, back pain and myalgias.  Skin: Positive for rash. Negative for color change.  Allergic/Immunologic: Negative.   Neurological: Negative.  Negative for dizziness, weakness and numbness.  Hematological: Negative for adenopathy. Does not bruise/bleed easily.  Psychiatric/Behavioral: Positive for decreased concentration. Negative for behavioral problems, dysphoric  mood, sleep disturbance and suicidal ideas. The patient is not nervous/anxious.     Objective:  BP 130/70 (BP Location: Left Arm, Patient Position: Sitting, Cuff Size: Normal)   Pulse 87   Temp 98.2 F (36.8 C) (Oral)   Resp 16   Ht 5\' 2"  (1.575 m)   Wt 118 lb (53.5 kg)   SpO2 100%   BMI 21.58 kg/m   BP Readings from Last 3 Encounters:  09/21/16 130/70  05/04/16 110/78  04/13/16 132/80    Wt  Readings from Last 3 Encounters:  09/21/16 118 lb (53.5 kg)  05/04/16 119 lb (54 kg)  04/13/16 120 lb (54.4 kg)    Physical Exam  Constitutional: She is oriented to person, place, and time. No distress.  HENT:  Mouth/Throat: Oropharynx is clear and moist. No oropharyngeal exudate.  Eyes: Conjunctivae are normal. Right eye exhibits no discharge. Left eye exhibits no discharge. No scleral icterus.  Neck: Normal range of motion. Neck supple. No JVD present. No thyromegaly present.  Cardiovascular: Normal rate, regular rhythm and intact distal pulses.  Exam reveals no gallop and no friction rub.   No murmur heard. Pulmonary/Chest: Effort normal and breath sounds normal. No stridor. No respiratory distress. She has no wheezes. She has no rales. She exhibits no tenderness.  Abdominal: Soft. Bowel sounds are normal. She exhibits no distension and no mass. There is no tenderness. There is no rebound and no guarding.  Musculoskeletal: Normal range of motion. She exhibits no edema or tenderness.       Left hand: She exhibits deformity. She exhibits normal range of motion, no tenderness and normal capillary refill. Normal sensation noted. Normal strength noted.       Hands: Lymphadenopathy:    She has no cervical adenopathy.  Neurological: She is alert and oriented to person, place, and time.  Skin: Skin is warm and dry. No rash noted. She is not diaphoretic. No erythema. No pallor.  Psychiatric: She has a normal mood and affect. Her behavior is normal. Judgment and thought content normal.  Vitals reviewed.   Lab Results  Component Value Date   WBC 8.0 09/21/2016   HGB 11.3 (L) 09/21/2016   HCT 35.8 (L) 09/21/2016   PLT 354.0 09/21/2016   GLUCOSE 92 09/21/2016   CHOL 134 09/21/2016   TRIG 128.0 09/21/2016   HDL 48.70 09/21/2016   LDLDIRECT 72.0 11/05/2015   LDLCALC 60 09/21/2016   ALT 11 09/21/2016   AST 14 09/21/2016   NA 136 09/21/2016   K 4.1 09/21/2016   CL 100 09/21/2016    CREATININE 0.68 09/21/2016   BUN 9 09/21/2016   CO2 31 09/21/2016   TSH 1.73 09/21/2016   HGBA1C 6.4 09/21/2016   MICROALBUR 1.4 05/05/2016    Dg Bone Density  Result Date: 04/20/2016 Date of study: 04/20/2016 Exam: DUAL X-RAY ABSORPTIOMETRY (DXA) FOR BONE MINERAL DENSITY (BMD) Instrument: [employer] Requesting Provider: Dr. Loanne Drilling Indication: follow up for osteoporosis Comparison: none (please note that it is not possible to compare data from different instruments) Clinical data: Pt is a 71 y.o. female without previous history of fracture. On calcium and vitamin D. Had one dose of Reclast in 2017. Started Boniva 1 week prior to DEXA scan. Results:  Lumbar spine (L1-L4) Femoral neck (FN) T-score -0.2  RFN:-2.3 LFN:-1.9 Assessment: the BMD is low according to the [facility] classification for osteoporosis (see below). Fracture risk: moderate FRAX score: 10 year major osteoporotic risk: 12.6%. 10 year hip fracture risk:2.9%. These  are under the thresholds for treatment of 20% and 3%, respectively. Comments: the technical quality of the study is good. Evaluation for secondary causes should be considered if clinically indicated. Recommend optimizing calcium (1200 mg/day) and vitamin D (800 IU/day) intake. Followup: Repeat BMD is appropriate after 1-2 years as she just started Boniva. WHO criteria for diagnosis of osteoporosis in postmenopausal women and in men 48 y/o or older: - normal: T-score -1.0 to + 1.0 - osteopenia/low bone density: T-score between -2.5 and -1.0 - osteoporosis: T-score below -2.5 - severe osteoporosis: T-score below -2.5 with history of fragility fracture Note: although not part of the WHO classification, the presence of a fragility fracture, regardless of the T-score, should be considered diagnostic of osteoporosis, provided other causes for the fracture have been excluded. Treatment: The National Osteoporosis Foundation recommends that treatment be considered in postmenopausal women  and men age 65 or older with: 1. Hip or vertebral (clinical or morphometric) fracture 2. T-score of - 2.5 or lower at the spine or hip 3. 10-year fracture probability by FRAX of at least 20% for a major osteoporotic fracture and 3% for a hip fracture Philemon Kingdom, MD [facility]    Assessment & Plan:   Jill Chandler was seen today for diabetes, hyperlipidemia and anemia.  Diagnoses and all orders for this visit:  Need for influenza vaccination -     Flu vaccine HIGH DOSE PF (Fluzone High dose)  Type 2 diabetes mellitus with complication, without long-term current use of insulin ([place])- her A1c is 6.4%. Her blood sugars are adequately well controlled. Her creatinine clearance is 52.5 mL's per minute. Will continue metformin at the current dose. -     Comprehensive metabolic panel; Future -     CBC with Differential/Platelet; Future -     Hemoglobin A1c; Future  Attention deficit hyperactivity disorder (ADHD), predominantly inattentive type-  -     CBC with Differential/Platelet; Future  Hyperlipidemia with target LDL less than 100- she has achieved her LDL goal is doing well on the statin. -     Lipid panel; Future -     Thyroid Panel With TSH; Future  Attention deficit hyperactivity disorder (ADHD), combined type -     Discontinue: amphetamine-dextroamphetamine (ADDERALL) 20 MG tablet; Take 1 tablet (20 mg total) by mouth 2 (two) times daily. -     Discontinue: amphetamine-dextroamphetamine (ADDERALL) 20 MG tablet; Take 1 tablet (20 mg total) by mouth 2 (two) times daily. -     amphetamine-dextroamphetamine (ADDERALL) 20 MG tablet; Take 1 tablet (20 mg total) by mouth 2 (two) times daily.  Rosacea, acne -     metroNIDAZOLE (METROGEL) 0.75 % gel; Apply 1 application topically 2 (two) times daily.  Dupuytren's contracture of left hand -     Ambulatory referral to Orthopedic Surgery  Deficiency anemia- she has new onset anemia with a slight decrease in her indices and an  increased RDW. I've asked her to return to the lab to be screened for vitamin deficiencies. -     IBC panel; Future -     Vitamin B12; Future -     Vitamin B1; Future -     Folate; Future -     Ferritin; Future   I have discontinued Jill Chandler ammonium lactate. I am also having her start on metroNIDAZOLE. Additionally, I am having her maintain her Calcium Carbonate-Vitamin D (CALCIUM + D PO), fish oil-omega-3 fatty acids, etodolac, buPROPion, omeprazole, traZODone, ibandronate, metFORMIN, FLUoxetine, simvastatin, glucose blood, and amphetamine-dextroamphetamine.  Meds ordered this encounter  Medications  . DISCONTD: amphetamine-dextroamphetamine (ADDERALL) 20 MG tablet    Sig: Take 1 tablet (20 mg total) by mouth 2 (two) times daily.    Dispense:  180 tablet    Refill:  0    Fill on or after 10/22/16  . DISCONTD: amphetamine-dextroamphetamine (ADDERALL) 20 MG tablet    Sig: Take 1 tablet (20 mg total) by mouth 2 (two) times daily.    Dispense:  180 tablet    Refill:  0    Fill on or after 11/21/16  . amphetamine-dextroamphetamine (ADDERALL) 20 MG tablet    Sig: Take 1 tablet (20 mg total) by mouth 2 (two) times daily.    Dispense:  180 tablet    Refill:  0    Fill on or after 12/22/16  . metroNIDAZOLE (METROGEL) 0.75 % gel    Sig: Apply 1 application topically 2 (two) times daily.    Dispense:  45 g    Refill:  5     Follow-up: Return in about 6 months (around 03/24/2017).  Jill Calico, MD

## 2016-09-22 ENCOUNTER — Encounter: Payer: Self-pay | Admitting: Internal Medicine

## 2016-09-22 ENCOUNTER — Other Ambulatory Visit (INDEPENDENT_AMBULATORY_CARE_PROVIDER_SITE_OTHER): Payer: Medicare Other

## 2016-09-22 ENCOUNTER — Other Ambulatory Visit: Payer: Self-pay | Admitting: Internal Medicine

## 2016-09-22 DIAGNOSIS — D508 Other iron deficiency anemias: Secondary | ICD-10-CM

## 2016-09-22 DIAGNOSIS — D539 Nutritional anemia, unspecified: Secondary | ICD-10-CM

## 2016-09-22 DIAGNOSIS — D528 Other folate deficiency anemias: Secondary | ICD-10-CM | POA: Insufficient documentation

## 2016-09-22 LAB — IBC PANEL
IRON: 22 ug/dL — AB (ref 42–145)
Saturation Ratios: 4.6 % — ABNORMAL LOW (ref 20.0–50.0)
Transferrin: 342 mg/dL (ref 212.0–360.0)

## 2016-09-22 LAB — THYROID PANEL WITH TSH
FREE THYROXINE INDEX: 2.8 (ref 1.4–3.8)
T3 UPTAKE: 31 % (ref 22–35)
T4, Total: 8.9 ug/dL (ref 5.1–11.9)
TSH: 1.73 m[IU]/L

## 2016-09-22 LAB — FERRITIN: FERRITIN: 5.2 ng/mL — AB (ref 10.0–291.0)

## 2016-09-22 LAB — VITAMIN B12: Vitamin B-12: 1356 pg/mL — ABNORMAL HIGH (ref 211–911)

## 2016-09-22 LAB — FOLATE: FOLATE: 5.6 ng/mL — AB (ref >5.9)

## 2016-09-22 MED ORDER — FOLIC ACID 1 MG PO TABS: 1.0000 mg | ORAL_TABLET | Freq: Every day | ORAL | 1 refills | Status: DC

## 2016-09-22 MED ORDER — FERROUS SULFATE 220 (44 FE) MG/5ML PO ELIX: 220.0000 mg | ORAL_SOLUTION | Freq: Three times a day (TID) | ORAL | 5 refills | Status: DC

## 2016-09-23 ENCOUNTER — Encounter: Payer: Self-pay | Admitting: Internal Medicine

## 2016-09-26 LAB — VITAMIN B1: VITAMIN B1 (THIAMINE): 13 nmol/L (ref 8–30)

## 2016-10-01 ENCOUNTER — Encounter (HOSPITAL_BASED_OUTPATIENT_CLINIC_OR_DEPARTMENT_OTHER): Payer: Self-pay | Admitting: *Deleted

## 2016-10-01 NOTE — Progress Notes (Signed)
NPO AFTER MN.  ARRIVE AT 0600.  NEEDS T&S.  DR ORDERED LAB , CBC AND BMET ,  PT STATES HAD LAB WORK DONE 2 WKS AGO AT PCP OFFICE.  DID SEE IN EPIC RESULTS OF CBCdiff AND CMET DONE 09-21-2016.  PT WOULD LIKE TO SEE IF DR RICHARDSON WOULD EXCEPT THESE LABS.   CALLED AND SPOKE W/ HANNAH, OR SCHEDULER FOR DR Marvel Plan, VIA PHONE,  PER DR RICHARDSON OK TO USE PREVIOUS LAB RESULTS, D/C HER ORDERS FOR CBC AND BMET.

## 2016-10-05 NOTE — H&P (Signed)
Jill Chandler is an 71 y.o. female G2P0 who presents for a scheduled laparoscopic BSO for a multicystic left ovary about 4-5cm and a solid right ovarian mass that has slightly increased in size from 1.5cm to 2cm on consecutive [place]  Studies 3 months apart.  HE4 and CA-125 WNL.  D/w pt given solid appearing nature of right ovarian mass and increase in size would recommend removal for definitive diagnosis.  She is in agreement.  She has a h/o laparotomy years ago for a cystectomy and a h/o vulvar and cervical dysplasia.   Pertinent Gynecological History: Last pap: normal Date: Pap 1/18 WNL OB History: EAB x 2  Menstrual History:  No LMP recorded. Patient is postmenopausal.    Past Medical History:  Diagnosis Date  . ADHD (attention deficit hyperactivity disorder)   . Adnexal mass   . Barrett's esophagus 03/01/2013  . 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 Depression   . Folic acid deficiency   . GERD (gastroesophageal reflux disease)   . History of adenomatous polyp of colon    TUBULAR ADENOM'S  . History of cervical dysplasia   . History of ovarian cystectomy    approx. 1981  . History of squamous cell carcinoma in situ    ANAL LESIONS WITH S/P  EXCISIONAL BX 'S--- LOCALIZED--- in doctor office  . 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 Hyperlipidemia   . Iron deficiency anemia   . OA (osteoarthritis)   . Osteoporosis   . Rosacea, acne   . Type 2 diabetes mellitus ([place])   . Uterine fibroid     Past Surgical History:  Procedure Laterality Date  . ANTERIOR CERVICAL DECOMP/DISCECTOMY FUSION  11-12-2005  dr Vertell Limber   C3--4  . BREAST REDUCTION SURGERY Bilateral 11-24-2004   dr Georgia Lopes  . CATARACT EXTRACTION W/ INTRAOCULAR LENS  IMPLANT, BILATERAL  early 2000s approx.  . COLONOSCOPY  last one 02-24-2013  . ESOPHAGOGASTRODUODENOSCOPY  12/02/2015  . LEEP   YRS AGO   cervical dysplasia  . OVARIAN CYST REMOVAL  1981 approx.  . TRANSTHORACIC ECHOCARDIOGRAM  04/11/2008   ef 59-56%, grade 1 diastolic dysfunction  . VULVECTOMY PARTIAL  1978 approx.    Family History  Problem Relation Age of Onset  . Breast cancer Sister 20  . Diabetes Sister   . Hypertension Sister   . Breast cancer Sister 58  . Alcohol abuse Other   . Drug abuse Other   . Hypertension Other   . Hyperlipidemia Other   . Heart disease Mother   . Osteoporosis Sister   . Cancer Neg Hx   . Early death Neg Hx   . Hearing loss Neg Hx   . Kidney disease Neg Hx   . Learning disabilities Neg Hx   . Stroke Neg Hx   . Colon cancer Neg Hx     Social History:  reports that she quit smoking about 18 years ago. Her smoking use included Cigarettes. She quit after 30.00 years of use. She has never used smokeless tobacco. She reports that she drinks alcohol. She reports that she does not use drugs.  Allergies: No Known Allergies  No prescriptions prior to admission.    Review of Systems  Gastrointestinal: Negative for abdominal pain, constipation and nausea.    Height 5\' 2"  (1.575  m), weight 53.5 kg (118 lb). Physical Exam  Constitutional: She appears well-developed and well-nourished.  Cardiovascular: Normal rate and regular rhythm.   Respiratory: Effort normal and breath sounds normal.  GI: Soft.  Genitourinary: Vagina normal and uterus normal.  Neurological: She is alert.  Psychiatric: She has a normal mood and affect.    No results found for this or any previous visit (from the past 24 hour(s)).  No results found.  Assessment/Plan: D/w pt risks and benefits of bleeding infection and possible damage to bowel and bladder and larger incision should a complication arise with a delay in recovery.  She knows that if her pathology ultimately revealed cancer, she would need further surgery for staging with Gyn Oncology, but given her normal tumor markers we do not think  think is highly likely.  Will proceed with [facility] trocar given prior surgery and larger left ovary  Logan Bores 10/05/2016, 11:37 AM

## 2016-10-06 ENCOUNTER — Ambulatory Visit (HOSPITAL_BASED_OUTPATIENT_CLINIC_OR_DEPARTMENT_OTHER): Payer: Medicare Other | Admitting: Certified Registered"

## 2016-10-06 ENCOUNTER — Ambulatory Visit (HOSPITAL_BASED_OUTPATIENT_CLINIC_OR_DEPARTMENT_OTHER)
Admission: RE | Admit: 2016-10-06 | Discharge: 2016-10-06 | Disposition: A | Discharge status: Home / Self Care | Payer: Medicare Other | Source: Ambulatory Visit | Attending: Obstetrics and Gynecology | Admitting: Obstetrics and Gynecology

## 2016-10-06 ENCOUNTER — Encounter (HOSPITAL_BASED_OUTPATIENT_CLINIC_OR_DEPARTMENT_OTHER)
Admission: RE | Disposition: A | Discharge status: Home / Self Care | Payer: Self-pay | Source: Ambulatory Visit | Attending: Obstetrics and Gynecology

## 2016-10-06 ENCOUNTER — Encounter (HOSPITAL_BASED_OUTPATIENT_CLINIC_OR_DEPARTMENT_OTHER): Payer: Self-pay

## 2016-10-06 DIAGNOSIS — D270 Benign neoplasm of right ovary: Secondary | ICD-10-CM | POA: Insufficient documentation

## 2016-10-06 DIAGNOSIS — Z87891 Personal history of nicotine dependence: Secondary | ICD-10-CM | POA: Insufficient documentation

## 2016-10-06 DIAGNOSIS — K219 Gastro-esophageal reflux disease without esophagitis: Secondary | ICD-10-CM | POA: Diagnosis not present

## 2016-10-06 DIAGNOSIS — E119 Type 2 diabetes mellitus without complications: Secondary | ICD-10-CM | POA: Diagnosis not present

## 2016-10-06 DIAGNOSIS — Z7984 Long term (current) use of oral hypoglycemic drugs: Secondary | ICD-10-CM | POA: Diagnosis not present

## 2016-10-06 DIAGNOSIS — N736 Female pelvic peritoneal adhesions (postinfective): Secondary | ICD-10-CM | POA: Insufficient documentation

## 2016-10-06 DIAGNOSIS — D271 Benign neoplasm of left ovary: Secondary | ICD-10-CM | POA: Insufficient documentation

## 2016-10-06 DIAGNOSIS — N838 Other noninflammatory disorders of ovary, fallopian tube and broad ligament: Secondary | ICD-10-CM | POA: Diagnosis not present

## 2016-10-06 DIAGNOSIS — N839 Noninflammatory disorder of ovary, fallopian tube and broad ligament, unspecified: Secondary | ICD-10-CM | POA: Insufficient documentation

## 2016-10-06 DIAGNOSIS — N83202 Unspecified ovarian cyst, left side: Secondary | ICD-10-CM | POA: Diagnosis not present

## 2016-10-06 DIAGNOSIS — D509 Iron deficiency anemia, unspecified: Secondary | ICD-10-CM | POA: Diagnosis not present

## 2016-10-06 DIAGNOSIS — R1909 Other intra-abdominal and pelvic swelling, mass and lump: Secondary | ICD-10-CM | POA: Diagnosis not present

## 2016-10-06 HISTORY — DX: Leiomyoma of uterus, unspecified: D25.9

## 2016-10-06 HISTORY — DX: Personal history of cervical dysplasia: Z87.410

## 2016-10-06 HISTORY — DX: Age-related osteoporosis without current pathological fracture: M81.0

## 2016-10-06 HISTORY — DX: Type 2 diabetes mellitus without complications: E11.9

## 2016-10-06 HISTORY — DX: Other specified postprocedural states: Z98.890

## 2016-10-06 HISTORY — DX: Personal history of in-situ neoplasm of skin: Z86.007

## 2016-10-06 HISTORY — DX: Personal history of colonic polyps: Z86.010

## 2016-10-06 HISTORY — DX: Unspecified osteoarthritis, unspecified site: M19.90

## 2016-10-06 HISTORY — PX: LAPAROSCOPIC BILATERAL SALPINGO OOPHERECTOMY: SHX5890

## 2016-10-06 HISTORY — DX: Deficiency of other specified B group vitamins: E53.8

## 2016-10-06 HISTORY — DX: Iron deficiency anemia, unspecified: D50.9

## 2016-10-06 HISTORY — DX: Personal history of vulvar dysplasia: Z87.412

## 2016-10-06 HISTORY — DX: Other specified conditions associated with female genital organs and menstrual cycle: N94.89

## 2016-10-06 HISTORY — DX: Personal history of other diseases of the female genital tract: Z87.42

## 2016-10-06 HISTORY — DX: Rosacea, unspecified: L71.9

## 2016-10-06 LAB — GLUCOSE, CAPILLARY
GLUCOSE-CAPILLARY: 153 mg/dL — AB (ref 65–99)
Glucose-Capillary: 107 mg/dL — ABNORMAL HIGH (ref 65–99)

## 2016-10-06 LAB — TYPE AND SCREEN
ABO/RH(D): O POS
ANTIBODY SCREEN: NEGATIVE

## 2016-10-06 LAB — ABO/RH: ABO/RH(D): O POS

## 2016-10-06 SURGERY — SALPINGO-OOPHORECTOMY, BILATERAL, LAPAROSCOPIC
Anesthesia: General | Laterality: Bilateral

## 2016-10-06 MED ORDER — OXYCODONE HCL 5 MG PO TABS: 5.0000 mg | ORAL_TABLET | Freq: Once | ORAL | 0 refills | Status: DC | PRN

## 2016-10-06 MED ORDER — LIDOCAINE 2% (20 MG/ML) 5 ML SYRINGE
INTRAMUSCULAR | Status: AC
  Filled 2016-10-06: qty 5

## 2016-10-06 MED ORDER — SUGAMMADEX SODIUM 200 MG/2ML IV SOLN
INTRAVENOUS | Status: DC | PRN
  Administered 2016-10-06: 200 mg via INTRAVENOUS

## 2016-10-06 MED ORDER — ROCURONIUM BROMIDE 10 MG/ML (PF) SYRINGE
PREFILLED_SYRINGE | INTRAVENOUS | Status: DC | PRN
  Administered 2016-10-06: 10 mg via INTRAVENOUS
  Administered 2016-10-06: 30 mg via INTRAVENOUS

## 2016-10-06 MED ORDER — SUCCINYLCHOLINE CHLORIDE 200 MG/10ML IV SOSY
PREFILLED_SYRINGE | INTRAVENOUS | Status: DC | PRN
  Administered 2016-10-06: 100 mg via INTRAVENOUS

## 2016-10-06 MED ORDER — DEXAMETHASONE SODIUM PHOSPHATE 10 MG/ML IJ SOLN
INTRAMUSCULAR | Status: AC
  Filled 2016-10-06: qty 1

## 2016-10-06 MED ORDER — OXYCODONE HCL 5 MG PO TABS
5.0000 mg | ORAL_TABLET | Freq: Once | ORAL | Status: DC | PRN
  Filled 2016-10-06: qty 1

## 2016-10-06 MED ORDER — FENTANYL CITRATE (PF) 100 MCG/2ML IJ SOLN
INTRAMUSCULAR | Status: DC | PRN
  Administered 2016-10-06: 50 ug via INTRAVENOUS
  Administered 2016-10-06 (×2): 25 ug via INTRAVENOUS

## 2016-10-06 MED ORDER — EPHEDRINE SULFATE-NACL 50-0.9 MG/10ML-% IV SOSY
PREFILLED_SYRINGE | INTRAVENOUS | Status: DC | PRN
  Administered 2016-10-06: 10 mg via INTRAVENOUS

## 2016-10-06 MED ORDER — FENTANYL CITRATE (PF) 100 MCG/2ML IJ SOLN
25.0000 ug | INTRAMUSCULAR | Status: DC | PRN
  Filled 2016-10-06: qty 1

## 2016-10-06 MED ORDER — SUCCINYLCHOLINE CHLORIDE 200 MG/10ML IV SOSY
PREFILLED_SYRINGE | INTRAVENOUS | Status: AC
  Filled 2016-10-06: qty 10

## 2016-10-06 MED ORDER — LACTATED RINGERS IV SOLN
INTRAVENOUS | Status: DC
  Filled 2016-10-06: qty 1000

## 2016-10-06 MED ORDER — ACETAMINOPHEN 160 MG/5ML PO SOLN
325.0000 mg | ORAL | Status: DC | PRN
  Filled 2016-10-06: qty 20.3

## 2016-10-06 MED ORDER — LIDOCAINE 2% (20 MG/ML) 5 ML SYRINGE
INTRAMUSCULAR | Status: DC | PRN
  Administered 2016-10-06: 60 mg via INTRAVENOUS

## 2016-10-06 MED ORDER — ONDANSETRON HCL 4 MG/2ML IJ SOLN
INTRAMUSCULAR | Status: DC | PRN
  Administered 2016-10-06: 4 mg via INTRAVENOUS

## 2016-10-06 MED ORDER — ACETAMINOPHEN 325 MG PO TABS
325.0000 mg | ORAL_TABLET | ORAL | Status: DC | PRN
  Filled 2016-10-06: qty 2

## 2016-10-06 MED ORDER — ONDANSETRON HCL 4 MG/2ML IJ SOLN
INTRAMUSCULAR | Status: AC
  Filled 2016-10-06: qty 2

## 2016-10-06 MED ORDER — LACTATED RINGERS IV SOLN
INTRAVENOUS | Status: DC
  Administered 2016-10-06: 1000 mL via INTRAVENOUS
  Administered 2016-10-06 (×2): via INTRAVENOUS
  Filled 2016-10-06: qty 1000

## 2016-10-06 MED ORDER — FENTANYL CITRATE (PF) 100 MCG/2ML IJ SOLN
INTRAMUSCULAR | Status: AC
  Filled 2016-10-06: qty 2

## 2016-10-06 MED ORDER — IBUPROFEN 200 MG PO TABS: 600.0000 mg | ORAL_TABLET | Freq: Four times a day (QID) | ORAL | 0 refills | Status: DC | PRN

## 2016-10-06 MED ORDER — PROPOFOL 10 MG/ML IV BOLUS
INTRAVENOUS | Status: DC | PRN
  Administered 2016-10-06: 150 mg via INTRAVENOUS

## 2016-10-06 MED ORDER — KETOROLAC TROMETHAMINE 30 MG/ML IJ SOLN
INTRAMUSCULAR | Status: AC
  Filled 2016-10-06: qty 1

## 2016-10-06 MED ORDER — DEXAMETHASONE SODIUM PHOSPHATE 10 MG/ML IJ SOLN
INTRAMUSCULAR | Status: DC | PRN
  Administered 2016-10-06: 10 mg via INTRAVENOUS

## 2016-10-06 MED ORDER — MIDAZOLAM HCL 2 MG/2ML IJ SOLN
INTRAMUSCULAR | Status: DC | PRN
  Administered 2016-10-06: 1 mg via INTRAVENOUS

## 2016-10-06 MED ORDER — OXYCODONE HCL 5 MG/5ML PO SOLN
5.0000 mg | Freq: Once | ORAL | Status: DC | PRN
  Filled 2016-10-06: qty 5

## 2016-10-06 MED ORDER — PROPOFOL 10 MG/ML IV BOLUS
INTRAVENOUS | Status: AC
  Filled 2016-10-06: qty 20

## 2016-10-06 MED ORDER — SUGAMMADEX SODIUM 200 MG/2ML IV SOLN
INTRAVENOUS | Status: AC
  Filled 2016-10-06: qty 2

## 2016-10-06 MED ORDER — KETOROLAC TROMETHAMINE 30 MG/ML IJ SOLN
INTRAMUSCULAR | Status: DC | PRN
  Administered 2016-10-06: 30 mg via INTRAVENOUS

## 2016-10-06 MED ORDER — BUPIVACAINE HCL (PF) 0.25 % IJ SOLN
INTRAMUSCULAR | Status: DC | PRN
  Administered 2016-10-06: 20 mL

## 2016-10-06 MED ORDER — MIDAZOLAM HCL 2 MG/2ML IJ SOLN
INTRAMUSCULAR | Status: AC
  Filled 2016-10-06: qty 2

## 2016-10-06 MED ORDER — ROCURONIUM BROMIDE 50 MG/5ML IV SOSY
PREFILLED_SYRINGE | INTRAVENOUS | Status: AC
  Filled 2016-10-06: qty 5

## 2016-10-06 SURGICAL SUPPLY — 35 items
ADH SKN CLS APL DERMABOND .7 (GAUZE/BANDAGES/DRESSINGS) ×3
BAG SPEC RTRVL LRG 6X4 10 (ENDOMECHANICALS) ×1
CABLE HIGH FREQUENCY MONO STRZ (ELECTRODE) IMPLANT
CATH ROBINSON RED A/P 16FR (CATHETERS) ×1 IMPLANT
CLOTH BEACON ORANGE TIMEOUT ST (SAFETY) ×2 IMPLANT
DECANTER SPIKE VIAL GLASS SM (MISCELLANEOUS) ×1 IMPLANT
DERMABOND ADVANCED (GAUZE/BANDAGES/DRESSINGS) ×3
DERMABOND ADVANCED .7 DNX12 (GAUZE/BANDAGES/DRESSINGS) ×1 IMPLANT
DRSG COVADERM PLUS 2X2 (GAUZE/BANDAGES/DRESSINGS) ×2 IMPLANT
DRSG OPSITE POSTOP 3X4 (GAUZE/BANDAGES/DRESSINGS) ×1 IMPLANT
DURAPREP 26ML APPLICATOR (WOUND CARE) ×2 IMPLANT
GLOVE BIO SURGEON STRL SZ 6.5 (GLOVE) ×5 IMPLANT
GLOVE BIOGEL PI IND STRL 7.0 (GLOVE) ×2 IMPLANT
GLOVE BIOGEL PI INDICATOR 7.0 (GLOVE) ×2
GOWN STRL REUS W/TWL LRG LVL3 (GOWN DISPOSABLE) ×4 IMPLANT
NEEDLE INSUFFLATION 120MM (ENDOMECHANICALS) ×2 IMPLANT
NS IRRIG 500ML POUR BTL (IV SOLUTION) ×2 IMPLANT
PACK LAPAROSCOPY BASIN (CUSTOM PROCEDURE TRAY) ×2 IMPLANT
PACK TRENDGUARD 450 HYBRID PRO (MISCELLANEOUS) IMPLANT
PACK TRENDGUARD 600 HYBRD PROC (MISCELLANEOUS) IMPLANT
POUCH SPECIMEN RETRIEVAL 10MM (ENDOMECHANICALS) ×1 IMPLANT
PROTECTOR NERVE ULNAR (MISCELLANEOUS) ×3 IMPLANT
SET IRRIG TUBING LAPAROSCOPIC (IRRIGATION / IRRIGATOR) IMPLANT
SHEARS HARMONIC ACE PLUS 36CM (ENDOMECHANICALS) ×1 IMPLANT
SLEEVE XCEL OPT CAN 5 100 (ENDOMECHANICALS) ×2 IMPLANT
SUT VIC AB 3-0 PS2 18 (SUTURE) ×2
SUT VIC AB 3-0 PS2 18XBRD (SUTURE) ×1 IMPLANT
SUT VICRYL 0 UR6 27IN ABS (SUTURE) ×1 IMPLANT
TOWEL OR 17X24 6PK STRL BLUE (TOWEL DISPOSABLE) ×4 IMPLANT
TRENDGUARD 450 HYBRID PRO PACK (MISCELLANEOUS) ×2
TRENDGUARD 600 HYBRID PROC PK (MISCELLANEOUS)
TROCAR BALLN 12MMX100 BLUNT (TROCAR) ×1 IMPLANT
TROCAR XCEL NON-BLD 11X100MML (ENDOMECHANICALS) IMPLANT
TROCAR XCEL NON-BLD 5MMX100MML (ENDOMECHANICALS) ×3 IMPLANT
WARMER LAPAROSCOPE (MISCELLANEOUS) ×2 IMPLANT

## 2016-10-06 NOTE — Transfer of Care (Signed)
Immediate Anesthesia Transfer of Care Note  Patient: Jill Chandler  Procedure(s) Performed: Procedure(s) (LRB): LAPAROSCOPIC BILATERAL SALPINGO OOPHORECTOMY (Bilateral)  Patient Location: PACU  Anesthesia Type: General  Level of Consciousness: awake, oriented, sedated and patient cooperative  Airway & Oxygen Therapy: Patient Spontanous Breathing and Patient connected to face mask oxygen  Post-op Assessment: Report given to PACU RN and Post -op Vital signs reviewed and stable  Post vital signs: Reviewed and stable  Complications: No apparent anesthesia complications  Last Vitals:  Vitals:   10/06/16 0603 10/06/16 0853  BP: 129/72 (!) 145/69  Pulse: 74 77  Resp: 16 12  Temp: 36.6 C 36.9 C  SpO2: 100% 96%    Last Pain:  Vitals:   10/06/16 0603  TempSrc: Oral      Patients Stated Pain Goal: 5 (10/06/16 0651)

## 2016-10-06 NOTE — Discharge Instructions (Signed)

## 2016-10-06 NOTE — Anesthesia Postprocedure Evaluation (Signed)
Anesthesia Post Note  Patient: Jill Chandler  Procedure(s) Performed: Procedure(s) (LRB): LAPAROSCOPIC BILATERAL SALPINGO OOPHORECTOMY (Bilateral)     Patient location during evaluation: PACU Anesthesia Type: General Level of consciousness: awake and alert Pain management: pain level controlled Vital Signs Assessment: post-procedure vital signs reviewed and stable Respiratory status: spontaneous breathing, nonlabored ventilation, respiratory function stable and patient connected to nasal cannula oxygen Cardiovascular status: blood pressure returned to baseline and stable Postop Assessment: no signs of nausea or vomiting Anesthetic complications: no    Last Vitals:  Vitals:   10/06/16 1000 10/06/16 1027  BP: (!) 115/58 96/65  Pulse: 74 75  Resp: 11 14  Temp:  (!) 36.4 C  SpO2: 93% 99%    Last Pain:  Vitals:   10/06/16 1027  TempSrc: Oral                 Jasiri Hanawalt

## 2016-10-06 NOTE — Progress Notes (Signed)
Notified Dr. Ermalene Postin that patient seen with ice chips and gum entering hospital, and patient denies having anything other than sips of water with meds this am.

## 2016-10-06 NOTE — Anesthesia Preprocedure Evaluation (Signed)
Anesthesia Evaluation  Patient identified by MRN, date of birth, ID band Patient awake    Reviewed: Allergy & Precautions, NPO status , Patient's Chart, lab work & pertinent test results  History of Anesthesia Complications Negative for: history of anesthetic complications  Airway Mallampati: II  TM Distance: >3 FB Neck ROM: Full    Dental  (+) Teeth Intact   Pulmonary neg shortness of breath, neg sleep apnea, neg recent URI, former smoker, neg PE   breath sounds clear to auscultation       Cardiovascular negative cardio ROS   Rhythm:Regular     Neuro/Psych PSYCHIATRIC DISORDERS Depression negative neurological ROS     GI/Hepatic GERD  Medicated and Controlled,  Endo/Other  diabetes, Type 2, Oral Hypoglycemic Agents  Renal/GU      Musculoskeletal  (+) Arthritis ,   Abdominal   Peds  Hematology  (+) Blood dyscrasia, anemia ,   Anesthesia Other Findings   Reproductive/Obstetrics                             Anesthesia Physical Anesthesia Plan  ASA: II  Anesthesia Plan: General   Post-op Pain Management:    Induction: Intravenous  PONV Risk Score and Plan: 3 and Ondansetron, Dexamethasone and Midazolam  Airway Management Planned: Oral ETT  Additional Equipment: None  Intra-op Plan:   Post-operative Plan: Extubation in OR  Informed Consent: I have reviewed the patients History and Physical, chart, labs and discussed the procedure including the risks, benefits and alternatives for the proposed anesthesia with the patient or authorized representative who has indicated his/her understanding and acceptance.   Dental advisory given  Plan Discussed with: CRNA and Surgeon  Anesthesia Plan Comments:         Anesthesia Quick Evaluation

## 2016-10-06 NOTE — Progress Notes (Signed)
Patient ID: Jill Chandler, female   DOB: 08/25/1945, 71 y.o.   MRN: [id number] Per patient no changes in dictated H&P.  BS this AM 107.  Reviewed surgery in detail with patient and her friends.  Ready to proceed.

## 2016-10-06 NOTE — Anesthesia Procedure Notes (Signed)
Procedure Name: Intubation Date/Time: 10/06/2016 7:26 AM Performed by: Denna Haggard D Pre-anesthesia Checklist: Patient identified, Emergency Drugs available, Suction available and Patient being monitored Patient Re-evaluated:Patient Re-evaluated prior to induction Oxygen Delivery Method: Circle system utilized Preoxygenation: Pre-oxygenation with 100% oxygen Induction Type: IV induction Ventilation: Mask ventilation without difficulty Laryngoscope Size: Mac and 3 Grade View: Grade I Tube type: Oral Tube size: 7.5 mm Number of attempts: 1 Airway Equipment and Method: Stylet and Oral airway Placement Confirmation: ETT inserted through vocal cords under direct vision,  positive ETCO2 and breath sounds checked- equal and bilateral Secured at: 22 cm Tube secured with: Tape Dental Injury: Teeth and Oropharynx as per pre-operative assessment

## 2016-10-06 NOTE — Op Note (Signed)
Operative Note    Preoperative Diagnosis Bilateral adnexal masses  Postoperative Diagnosis same  Procedure Laparoscopic Bilateral Salpingoophorectomies  Surgeon Paula Compton, MD Thornell Sartorius, MD  Anesthesia GETA  Fluids: EBL 52mL UOP 28mL straight cath prior to procedure IVF 1268mL LR  Findings Right ovary slightly enlarged with a lobular appearance, Left ovary 5-6cm and multicystic.  Abdomen and pelvis, uterus and liver edge otherwise WNL.  Specimen Right and left ovaries and fallopian tubes  Procedure Note Patient was taken to the operating room where general anesthesia was obtained without difficulty she was prepped and draped in the normal sterile fashion in the dorsal lithotomy position. An appropriate time out was performed. A Hulka tenaculum was placed within cervix and a Foley catheter in the bladder. Attention was then turned to the abdomen where of infraumbilical incision was made after injection with quarter percent Marcaine approximate 2 cm in width. The fascia was then identified and grasped with Coker clamps and elevated. The fascia was then opened sharply with Mayo scissors and intraperitoneal placement of the Sheryle Hail was performed, after a pursestring suture of 0 Vicryl was placed around the fascial edge. With the [facility] in place the pneumoperitoneum was obtained and the pelvis and abdomen inspected with findings as previously stated. Two additional 5 mm ports were placed under direct visualization from the lateral upper quadrants. Each port site was injected with quarter percent Marcaine prior placement. The right adnexa was relatively free and was elevated and the infundibulopelvic ligamnet and uteroovarian ligament taken down with harmonic scalpel until it was completely free,  It was then placed in the cul-de-sac.  The left ovary had some omental adhesions to it and the posterior left uterus which were taken down with harmonic.  The left adnexa was then able to  be elevated and the infundibulopelvic, broad ligatment and uteroovarian ligatment taken down with the harmonic scalpel.  Once this was free, the scope was changed to 68mm and placed in the right port.  The endobag was then introduced through the [place] and both adnexa placed within it.  The bag was elevated to the umbilical port and the [facility] removed.  The cystic ovary was then drained with a syringe while contained in the bag until the bag was able to be pulled through the incision intact and handed off to pathology. The umbilical incision was then closed with the previously placed 0 vicryl pursestring suture and the skin closed with 4-0 vicrul and dermabond.  Both 5 mm ports were removed after the remainder of the pelvis and abdomen were inspected and found to be normal.  A 4 quadrant look revealed no injuries or bleeding. The instruments were removed from the abdomen and the pneumoperitoneum reduced through the trocar. The 5 mm ports were closed with a 4-0 vicryl stitch, Dermabond and a bandage were placed. Patient was then awakened and taken to the recovery room in good condition.

## 2016-10-07 ENCOUNTER — Encounter (HOSPITAL_BASED_OUTPATIENT_CLINIC_OR_DEPARTMENT_OTHER): Payer: Self-pay | Admitting: Obstetrics and Gynecology

## 2016-10-16 ENCOUNTER — Telehealth: Payer: Self-pay

## 2016-10-16 DIAGNOSIS — F329 Major depressive disorder, single episode, unspecified: Secondary | ICD-10-CM

## 2016-10-16 DIAGNOSIS — F902 Attention-deficit hyperactivity disorder, combined type: Secondary | ICD-10-CM

## 2016-10-16 DIAGNOSIS — F32A Depression, unspecified: Secondary | ICD-10-CM

## 2016-10-16 MED ORDER — BUPROPION HCL ER (XL) 150 MG PO TB24: 150.0000 mg | ORAL_TABLET | Freq: Every morning | ORAL | 1 refills | Status: DC

## 2016-10-16 NOTE — Telephone Encounter (Signed)
[facility] sent rx rf rq.  LOV-09/22/2016 erx sent.

## 2016-12-01 ENCOUNTER — Other Ambulatory Visit: Payer: Self-pay | Admitting: Internal Medicine

## 2016-12-01 DIAGNOSIS — F32A Depression, unspecified: Secondary | ICD-10-CM

## 2016-12-01 DIAGNOSIS — F329 Major depressive disorder, single episode, unspecified: Secondary | ICD-10-CM

## 2016-12-02 ENCOUNTER — Encounter: Payer: Self-pay | Admitting: Internal Medicine

## 2016-12-03 NOTE — Telephone Encounter (Signed)
Spoke to patient and informed that we do not control what the pharmacy or manufacturer does as far as where the medication comes from or what it looks like.   Patient stated understanding.

## 2017-03-01 ENCOUNTER — Encounter: Payer: Self-pay | Admitting: Internal Medicine

## 2017-03-02 ENCOUNTER — Telehealth: Payer: Self-pay | Admitting: Internal Medicine

## 2017-03-02 MED ORDER — FLUOXETINE HCL 40 MG PO CAPS: ORAL_CAPSULE | ORAL | 0 refills | Status: DC

## 2017-03-02 NOTE — Telephone Encounter (Signed)
Copied from [place] [id number]. Topic: Quick Communication - Rx Refill/Question >> Mar 02, 2017 11:23 AM Oliver Pila B wrote: Medication: FLUoxetine (PROZAC) 40 MG capsule [[id number]]    Has the patient contacted their pharmacy? Yes.     (Agent: If no, request that the patient contact the pharmacy for the refill.)   Preferred Pharmacy (with phone number or street name): [facility], [place]: Please be advised that RX refills may take up to 3 business days. We ask that you follow-up with your pharmacy.

## 2017-03-02 NOTE — Telephone Encounter (Signed)
Pt Is due for f/u appt sent 30 day script to [facility]. Must see provider for 90 day scripts...lmb

## 2017-03-06 ENCOUNTER — Encounter: Payer: Self-pay | Admitting: Internal Medicine

## 2017-03-10 MED ORDER — FLUOXETINE HCL 40 MG PO CAPS: 40.0000 mg | ORAL_CAPSULE | Freq: Two times a day (BID) | ORAL | 0 refills | Status: DC

## 2017-03-15 ENCOUNTER — Telehealth: Payer: Self-pay | Admitting: Internal Medicine

## 2017-03-15 MED ORDER — FLUOXETINE HCL 40 MG PO CAPS: 40.0000 mg | ORAL_CAPSULE | Freq: Two times a day (BID) | ORAL | 0 refills | Status: DC

## 2017-03-15 NOTE — Telephone Encounter (Signed)
Resent rx to [facility].Marland KitchenJohny Chess

## 2017-03-15 NOTE — Addendum Note (Signed)
Addended by: Earnstine Regal on: 03/15/2017 03:41 PM   Modules accepted: Orders

## 2017-03-15 NOTE — Telephone Encounter (Signed)
Copied from [place] [id number]. Topic: Quick Communication - Rx Refill/Question >> Mar 02, 2017 11:23 AM Oliver Pila B wrote: Medication: FLUoxetine (PROZAC) 40 MG capsule [[id number]]    Has the patient contacted their pharmacy? Yes.     (Agent: If no, request that the patient contact the pharmacy for the refill.)   Preferred Pharmacy (with phone number or street name): [place], [place]: Please be advised that RX refills may take up to 3 business days. We ask that you follow-up with your pharmacy. >> Mar 15, 2017  3:23 PM Percell Belt A wrote: Pt called in said that this med should have been sent to [facility] mail order instead of local pharmacy and for a 90 day supply.

## 2017-03-16 ENCOUNTER — Encounter: Payer: Self-pay | Admitting: Internal Medicine

## 2017-03-16 ENCOUNTER — Other Ambulatory Visit (INDEPENDENT_AMBULATORY_CARE_PROVIDER_SITE_OTHER): Payer: Medicare Other

## 2017-03-16 ENCOUNTER — Ambulatory Visit: Payer: Medicare Other | Admitting: Internal Medicine

## 2017-03-16 VITALS — BP 144/86 | HR 86 | Temp 98.5°F | Resp 16 | Ht 62.0 in | Wt 118.0 lb

## 2017-03-16 DIAGNOSIS — I10 Essential (primary) hypertension: Secondary | ICD-10-CM

## 2017-03-16 DIAGNOSIS — F902 Attention-deficit hyperactivity disorder, combined type: Secondary | ICD-10-CM | POA: Diagnosis not present

## 2017-03-16 DIAGNOSIS — Z1159 Encounter for screening for other viral diseases: Secondary | ICD-10-CM | POA: Diagnosis not present

## 2017-03-16 DIAGNOSIS — Z23 Encounter for immunization: Secondary | ICD-10-CM | POA: Diagnosis not present

## 2017-03-16 DIAGNOSIS — D508 Other iron deficiency anemias: Secondary | ICD-10-CM | POA: Diagnosis not present

## 2017-03-16 DIAGNOSIS — E118 Type 2 diabetes mellitus with unspecified complications: Secondary | ICD-10-CM | POA: Diagnosis not present

## 2017-03-16 DIAGNOSIS — F329 Major depressive disorder, single episode, unspecified: Secondary | ICD-10-CM

## 2017-03-16 DIAGNOSIS — F32A Depression, unspecified: Secondary | ICD-10-CM

## 2017-03-16 DIAGNOSIS — G3184 Mild cognitive impairment, so stated: Secondary | ICD-10-CM | POA: Insufficient documentation

## 2017-03-16 DIAGNOSIS — D539 Nutritional anemia, unspecified: Secondary | ICD-10-CM | POA: Diagnosis not present

## 2017-03-16 DIAGNOSIS — D528 Other folate deficiency anemias: Secondary | ICD-10-CM | POA: Diagnosis not present

## 2017-03-16 DIAGNOSIS — R413 Other amnesia: Secondary | ICD-10-CM

## 2017-03-16 LAB — BASIC METABOLIC PANEL
BUN: 16 mg/dL (ref 6–23)
CALCIUM: 10 mg/dL (ref 8.4–10.5)
CO2: 31 mEq/L (ref 19–32)
Chloride: 100 mEq/L (ref 96–112)
Creatinine, Ser: 0.69 mg/dL (ref 0.40–1.20)
GFR: 89.53 mL/min (ref >60.00)
Glucose, Bld: 104 mg/dL — ABNORMAL HIGH (ref 70–99)
POTASSIUM: 3.8 meq/L (ref 3.5–5.1)
SODIUM: 137 meq/L (ref 135–145)

## 2017-03-16 LAB — IBC PANEL
IRON: 51 ug/dL (ref 42–145)
Saturation Ratios: 13.6 % — ABNORMAL LOW (ref 20.0–50.0)
Transferrin: 268 mg/dL (ref 212.0–360.0)

## 2017-03-16 LAB — CBC WITH DIFFERENTIAL/PLATELET
BASOS ABS: 0.1 10*3/uL (ref 0.0–0.1)
Basophils Relative: 0.9 % (ref 0.0–3.0)
EOS PCT: 2.2 % (ref 0.0–5.0)
Eosinophils Absolute: 0.2 10*3/uL (ref 0.0–0.7)
HEMATOCRIT: 43.9 % (ref 36.0–46.0)
Hemoglobin: 14.6 g/dL (ref 12.0–15.0)
LYMPHS PCT: 18.3 % (ref 12.0–46.0)
Lymphs Abs: 1.2 10*3/uL (ref 0.7–4.0)
MCHC: 33.2 g/dL (ref 30.0–36.0)
MCV: 82 fl (ref 78.0–100.0)
MONOS PCT: 9.4 % (ref 3.0–12.0)
Monocytes Absolute: 0.6 10*3/uL (ref 0.1–1.0)
NEUTROS ABS: 4.7 10*3/uL (ref 1.4–7.7)
Neutrophils Relative %: 69.2 % (ref 43.0–77.0)
Platelets: 254 10*3/uL (ref 150.0–400.0)
RBC: 5.35 Mil/uL — AB (ref 3.87–5.11)
RDW: 14.7 % (ref 11.5–15.5)
WBC: 6.8 10*3/uL (ref 4.0–10.5)

## 2017-03-16 LAB — URINALYSIS, ROUTINE W REFLEX MICROSCOPIC
Bilirubin Urine: NEGATIVE
Hgb urine dipstick: NEGATIVE
Leukocytes, UA: NEGATIVE
Nitrite: NEGATIVE
RBC / HPF: NONE SEEN (ref 0–?)
SPECIFIC GRAVITY, URINE: 1.02 (ref 1.000–1.030)
Total Protein, Urine: NEGATIVE
URINE GLUCOSE: NEGATIVE
Urobilinogen, UA: 1 (ref 0.0–1.0)
WBC UA: NONE SEEN (ref 0–?)
pH: 6.5 (ref 5.0–8.0)

## 2017-03-16 LAB — MICROALBUMIN / CREATININE URINE RATIO
Creatinine,U: 105.7 mg/dL
MICROALB UR: 1.1 mg/dL (ref 0.0–1.9)
MICROALB/CREAT RATIO: 1.1 mg/g (ref 0.0–30.0)

## 2017-03-16 LAB — FOLATE: Folate: >23.6 ng/mL (ref >5.9)

## 2017-03-16 LAB — FERRITIN: Ferritin: 43 ng/mL (ref 10.0–291.0)

## 2017-03-16 LAB — HEMOGLOBIN A1C: Hgb A1c MFr Bld: 6.1 % (ref 4.6–6.5)

## 2017-03-16 MED ORDER — BUPROPION HCL ER (XL) 150 MG PO TB24: 150.0000 mg | ORAL_TABLET | Freq: Every morning | ORAL | 1 refills | Status: DC

## 2017-03-16 MED ORDER — FLUOXETINE HCL 40 MG PO CAPS: 40.0000 mg | ORAL_CAPSULE | Freq: Two times a day (BID) | ORAL | 1 refills | Status: DC

## 2017-03-16 NOTE — Patient Instructions (Signed)

## 2017-03-16 NOTE — Progress Notes (Signed)
Subjective:  Patient ID: Jill Chandler, female    DOB: 05/13/1945  Age: 72 y.o. MRN: [id number]  CC: Anemia; Diabetes; and Hypertension   HPI Jill Chandler presents for f/up -she complains of fatigue, declining memory and forgetfulness.  She is also concerned that her blood pressure has gone up since her last visit.  She denies headache, blurred vision, chest pain, shortness of breath, palpitations, or edema.  Outpatient Medications Prior to Visit  Medication Sig Dispense Refill  . amphetamine-dextroamphetamine (ADDERALL) 20 MG tablet Take 1 tablet (20 mg total) by mouth 2 (two) times daily. (Patient taking differently: Take 40 mg by mouth every morning. ) 180 tablet 0  . Calcium Carbonate-Vitamin D (CALCIUM + D PO) Take 1 tablet by mouth daily.     . Cyanocobalamin (B-12) 2500 MCG SUBL Place 1 tablet under the tongue 3 (three) times a week.    . ferrous sulfate 324 (65 Fe) MG TBEC Take 1 tablet by mouth 2 (two) times daily.    . fish oil-omega-3 fatty acids 1000 MG capsule Take 1 g by mouth daily.     . folic acid (FOLVITE) 1 MG tablet Take 1 tablet (1 mg total) by mouth daily. (Patient taking differently: Take 1 mg by mouth every morning. ) 90 tablet 1  . glucose blood (ONETOUCH VERIO) test strip Use to check blood sugar twice a day DX:E11.8 200 each 1  . ibandronate (BONIVA) 150 MG tablet Take 1 tablet (150 mg total) by mouth every 30 (thirty) days. Take in the morning with a full glass of water, on an empty stomach, and do not take anything else by mouth or lie down for the next 30 min. 3 tablet 3  . metroNIDAZOLE (METROGEL) 0.75 % gel Apply 1 application topically 2 (two) times daily. (Patient taking differently: Apply 1 application topically 2 (two) times daily. ) 45 g 5  . omeprazole (PRILOSEC) 20 MG capsule Take 1 capsule (20 mg total) by mouth daily. (Patient taking differently: Take 20 mg by mouth every morning. ) 90 capsule 1  . simvastatin (ZOCOR) 40 MG tablet TAKE 1 BY MOUTH AT  BEDTIME (Patient taking differently: Take 40 mg by mouth at bedtime. TAKE 1 BY MOUTH AT BEDTIME) 90 tablet 3  . traZODone (DESYREL) 150 MG tablet TAKE 1 TABLET BY MOUTH AT BEDTIME AS NEEDED FOR SLEEP 90 tablet 1  . buPROPion (WELLBUTRIN XL) 150 MG 24 hr tablet Take 1 tablet (150 mg total) by mouth every morning. 90 tablet 1  . etodolac (LODINE) 200 MG capsule Take 1 capsule (200 mg total) by mouth 2 (two) times daily. (Patient taking differently: Take 200 mg by mouth 2 (two) times daily as needed. ) 60 capsule 3  . FLUoxetine (PROZAC) 40 MG capsule Take 1 capsule (40 mg total) by mouth 2 (two) times daily. 180 capsule 0  . ibuprofen (ADVIL) 200 MG tablet Take 3 tablets (600 mg total) by mouth every 6 (six) hours as needed. 30 tablet 0  . metFORMIN (GLUCOPHAGE-XR) 750 MG 24 hr tablet Take 1 tablet (750 mg total) by mouth daily with breakfast. 90 tablet 3  . omeprazole (PRILOSEC) 20 MG capsule TAKE 1 CAPSULE(20 MG) BY MOUTH DAILY 90 capsule 1  . oxyCODONE (OXY IR/ROXICODONE) 5 MG immediate release tablet Take 1 tablet (5 mg total) by mouth once as needed (for pain score of 1-4). 30 tablet 0   No facility-administered medications prior to visit.     ROS Review of  Systems  Constitutional: Positive for fatigue. Negative for appetite change, diaphoresis and unexpected weight change.  HENT: Negative for trouble swallowing.   Eyes: Negative for visual disturbance.  Respiratory: Negative for cough, chest tightness, shortness of breath and wheezing.   Cardiovascular: Negative for chest pain, palpitations and leg swelling.  Gastrointestinal: Negative for abdominal pain, diarrhea, nausea and vomiting.  Endocrine: Negative.   Genitourinary: Negative.  Negative for decreased urine volume, difficulty urinating, dysuria and hematuria.  Musculoskeletal: Negative.  Negative for arthralgias and neck pain.  Skin: Negative.  Negative for color change and rash.  Allergic/Immunologic: Negative.   Neurological:  Negative.  Negative for dizziness, weakness, light-headedness, numbness and headaches.  Hematological: Negative for adenopathy. Does not bruise/bleed easily.  Psychiatric/Behavioral: Positive for confusion and decreased concentration. Negative for agitation, behavioral problems, dysphoric mood, hallucinations, self-injury, sleep disturbance and suicidal ideas. The patient is nervous/anxious. The patient is not hyperactive.     Objective:  BP (!) 144/86 (BP Location: Right Arm, Patient Position: Sitting, Cuff Size: Small)   Pulse 86   Temp 98.5 F (36.9 C) (Oral)   Resp 16   Ht 5\' 2"  (1.575 m)   Wt 118 lb (53.5 kg)   SpO2 95%   BMI 21.58 kg/m   BP Readings from Last 3 Encounters:  03/16/17 (!) 144/86  10/06/16 96/65  09/21/16 130/70    Wt Readings from Last 3 Encounters:  03/16/17 118 lb (53.5 kg)  10/06/16 119 lb (54 kg)  09/21/16 118 lb (53.5 kg)    Physical Exam  Constitutional: She is oriented to person, place, and time. No distress.  HENT:  Mouth/Throat: Oropharynx is clear and moist. No oropharyngeal exudate.  Eyes: Conjunctivae are normal. Left eye exhibits no discharge. No scleral icterus.  Neck: Normal range of motion. Neck supple. No JVD present. No thyromegaly present.  Cardiovascular: Normal rate and normal heart sounds. Exam reveals no gallop.  No murmur heard. Pulmonary/Chest: Effort normal and breath sounds normal. No respiratory distress. She has no wheezes. She has no rales.  Abdominal: Soft. Bowel sounds are normal. She exhibits no distension and no mass.  Musculoskeletal: Normal range of motion. She exhibits no edema, tenderness or deformity.  Lymphadenopathy:    She has no cervical adenopathy.  Neurological: She is alert and oriented to person, place, and time.  Skin: Skin is warm and dry. No rash noted. She is not diaphoretic. No erythema.  Psychiatric: Judgment normal. Her mood appears anxious. Her affect is not angry. Her speech is not rapid and/or  pressured, not delayed and not tangential. She is not agitated, not aggressive, not slowed and not withdrawn. She does not exhibit a depressed mood. She expresses no homicidal and no suicidal ideation. She exhibits abnormal remote memory. She exhibits normal recent memory. She is attentive.  Vitals reviewed.   Lab Results  Component Value Date   WBC 6.8 03/16/2017   HGB 14.6 03/16/2017   HCT 43.9 03/16/2017   PLT 254.0 03/16/2017   GLUCOSE 104 (H) 03/16/2017   CHOL 134 09/21/2016   TRIG 128.0 09/21/2016   HDL 48.70 09/21/2016   LDLDIRECT 72.0 11/05/2015   LDLCALC 60 09/21/2016   ALT 11 09/21/2016   AST 14 09/21/2016   NA 137 03/16/2017   K 3.8 03/16/2017   CL 100 03/16/2017   CREATININE 0.69 03/16/2017   BUN 16 03/16/2017   CO2 31 03/16/2017   TSH 1.73 09/21/2016   HGBA1C 6.1 03/16/2017   MICROALBUR 1.1 03/16/2017    No  results found.  Assessment & Plan:   Jill Chandler was seen today for anemia, diabetes and hypertension.  Diagnoses and all orders for this visit:  Other iron deficiency anemia- Her H&H are normal now.  Her iron level is in the normal range.  She will continue the current dose of iron supplement. -     CBC with Differential/Platelet; Future -     Ferritin; Future -     IBC panel; Future  Depression, controlled -     buPROPion (WELLBUTRIN XL) 150 MG 24 hr tablet; Take 1 tablet (150 mg total) by mouth every morning.  Attention deficit hyperactivity disorder (ADHD), combined type -     buPROPion (WELLBUTRIN XL) 150 MG 24 hr tablet; Take 1 tablet (150 mg total) by mouth every morning.  Deficiency anemia- Her H&H are normal now.  Will screen for vitamin deficiencies. -     CBC with Differential/Platelet; Future -     Vitamin B12; Future -     Folate; Future -     Ferritin; Future -     IBC panel; Future -     VITAMIN D 25 Hydroxy (Vit-D Deficiency, Fractures); Future  Other folate deficiency anemias- Her H&H are normal now.  Her folate level is in the normal  range.  Will continue the current folate supplement. -     Vitamin B12; Future -     Folate; Future  Memory impairment -     Ambulatory referral to Neurology  Type 2 diabetes mellitus with complication, without long-term current use of insulin ([place])- Her A1c is down to 6.1%.  Her blood sugars are over controlled.  I have asked her to stop taking metformin. -     Hemoglobin A1c; Future -     Urinalysis, Routine w reflex microscopic; Future -     Microalbumin / creatinine urine ratio; Future -     Basic metabolic panel; Future  Need for hepatitis C screening test -     Hepatitis C antibody; Future  Essential hypertension- Her blood pressure is mildly elevated.  Her lab work is negative for secondary causes.  She is not willing to start an antihypertensive at this time.  Other orders -     FLUoxetine (PROZAC) 40 MG capsule; Take 1 capsule (40 mg total) by mouth 2 (two) times daily. -     Pneumococcal polysaccharide vaccine 23-valent greater than or equal to 2yo subcutaneous/IM   I have discontinued Jill Chandler's etodolac, metFORMIN, oxyCODONE, and ibuprofen. I am also having her maintain her Calcium Carbonate-Vitamin D (CALCIUM + D PO), fish oil-omega-3 fatty acids, omeprazole, ibandronate, simvastatin, glucose blood, amphetamine-dextroamphetamine, metroNIDAZOLE, folic acid, ferrous sulfate, B-12, traZODone, buPROPion, and FLUoxetine.  Meds ordered this encounter  Medications  . buPROPion (WELLBUTRIN XL) 150 MG 24 hr tablet    Sig: Take 1 tablet (150 mg total) by mouth every morning.    Dispense:  90 tablet    Refill:  1  . FLUoxetine (PROZAC) 40 MG capsule    Sig: Take 1 capsule (40 mg total) by mouth 2 (two) times daily.    Dispense:  180 capsule    Refill:  1    Follow-up appt is due must see provider for refills     Follow-up: Return in about 2 months (around 05/14/2017).  Scarlette Calico, MD

## 2017-03-17 ENCOUNTER — Encounter: Payer: Self-pay | Admitting: Internal Medicine

## 2017-03-17 DIAGNOSIS — I10 Essential (primary) hypertension: Secondary | ICD-10-CM | POA: Insufficient documentation

## 2017-03-17 LAB — VITAMIN D 25 HYDROXY (VIT D DEFICIENCY, FRACTURES): VITD: 57.06 ng/mL (ref 30.00–100.00)

## 2017-03-17 LAB — HEPATITIS C ANTIBODY
HEP C AB: NONREACTIVE
SIGNAL TO CUT-OFF: 0.01 (ref <1.00)

## 2017-03-17 LAB — VITAMIN B12: VITAMIN B 12: 801 pg/mL (ref 211–911)

## 2017-03-18 ENCOUNTER — Other Ambulatory Visit: Payer: Self-pay | Admitting: Internal Medicine

## 2017-03-18 DIAGNOSIS — D528 Other folate deficiency anemias: Secondary | ICD-10-CM

## 2017-03-21 ENCOUNTER — Emergency Department (HOSPITAL_COMMUNITY): Payer: Medicare Other

## 2017-03-21 ENCOUNTER — Emergency Department (HOSPITAL_COMMUNITY)
Admission: EM | Admit: 2017-03-21 | Discharge: 2017-03-21 | Disposition: A | Discharge status: Home / Self Care | Payer: Medicare Other | Attending: Emergency Medicine | Admitting: Emergency Medicine

## 2017-03-21 ENCOUNTER — Encounter (HOSPITAL_COMMUNITY): Payer: Self-pay | Admitting: Emergency Medicine

## 2017-03-21 DIAGNOSIS — Z87891 Personal history of nicotine dependence: Secondary | ICD-10-CM | POA: Diagnosis not present

## 2017-03-21 DIAGNOSIS — Y999 Unspecified external cause status: Secondary | ICD-10-CM | POA: Diagnosis not present

## 2017-03-21 DIAGNOSIS — M79631 Pain in right forearm: Secondary | ICD-10-CM | POA: Diagnosis not present

## 2017-03-21 DIAGNOSIS — S0993XA Unspecified injury of face, initial encounter: Secondary | ICD-10-CM | POA: Diagnosis not present

## 2017-03-21 DIAGNOSIS — Z85048 Personal history of other malignant neoplasm of rectum, rectosigmoid junction, and anus: Secondary | ICD-10-CM | POA: Diagnosis not present

## 2017-03-21 DIAGNOSIS — I10 Essential (primary) hypertension: Secondary | ICD-10-CM | POA: Insufficient documentation

## 2017-03-21 DIAGNOSIS — S0240CA Maxillary fracture, right side, initial encounter for closed fracture: Secondary | ICD-10-CM | POA: Diagnosis not present

## 2017-03-21 DIAGNOSIS — Z79899 Other long term (current) drug therapy: Secondary | ICD-10-CM | POA: Diagnosis not present

## 2017-03-21 DIAGNOSIS — Y92008 Other place in unspecified non-institutional (private) residence as the place of occurrence of the external cause: Secondary | ICD-10-CM | POA: Insufficient documentation

## 2017-03-21 DIAGNOSIS — W19XXXA Unspecified fall, initial encounter: Secondary | ICD-10-CM | POA: Insufficient documentation

## 2017-03-21 DIAGNOSIS — E119 Type 2 diabetes mellitus without complications: Secondary | ICD-10-CM | POA: Insufficient documentation

## 2017-03-21 DIAGNOSIS — M79641 Pain in right hand: Secondary | ICD-10-CM | POA: Diagnosis not present

## 2017-03-21 DIAGNOSIS — S02401A Maxillary fracture, unspecified, initial encounter for closed fracture: Secondary | ICD-10-CM

## 2017-03-21 DIAGNOSIS — S6991XA Unspecified injury of right wrist, hand and finger(s), initial encounter: Secondary | ICD-10-CM | POA: Diagnosis not present

## 2017-03-21 DIAGNOSIS — Y9301 Activity, walking, marching and hiking: Secondary | ICD-10-CM | POA: Insufficient documentation

## 2017-03-21 DIAGNOSIS — R05 Cough: Secondary | ICD-10-CM | POA: Diagnosis not present

## 2017-03-21 DIAGNOSIS — S59911A Unspecified injury of right forearm, initial encounter: Secondary | ICD-10-CM | POA: Diagnosis not present

## 2017-03-21 DIAGNOSIS — S0990XA Unspecified injury of head, initial encounter: Secondary | ICD-10-CM | POA: Diagnosis not present

## 2017-03-21 LAB — COMPREHENSIVE METABOLIC PANEL
ALBUMIN: 3.4 g/dL — AB (ref 3.5–5.0)
ALT: 23 U/L (ref 14–54)
AST: 25 U/L (ref 15–41)
Alkaline Phosphatase: 50 U/L (ref 38–126)
Anion gap: 11 (ref 5–15)
BILIRUBIN TOTAL: 0.3 mg/dL (ref 0.3–1.2)
BUN: 14 mg/dL (ref 6–20)
CHLORIDE: 95 mmol/L — AB (ref 101–111)
CO2: 25 mmol/L (ref 22–32)
CREATININE: 0.65 mg/dL (ref 0.44–1.00)
Calcium: 9.2 mg/dL (ref 8.9–10.3)
GFR calc Af Amer: >60 mL/min (ref >60)
GFR calc non Af Amer: >60 mL/min (ref >60)
GLUCOSE: 134 mg/dL — AB (ref 65–99)
Potassium: 3.7 mmol/L (ref 3.5–5.1)
Sodium: 131 mmol/L — ABNORMAL LOW (ref 135–145)
TOTAL PROTEIN: 6.6 g/dL (ref 6.5–8.1)

## 2017-03-21 LAB — CBC WITH DIFFERENTIAL/PLATELET
BASOS ABS: 0 10*3/uL (ref 0.0–0.1)
BASOS PCT: 0 %
Eosinophils Absolute: 0 10*3/uL (ref 0.0–0.7)
Eosinophils Relative: 0 %
HEMATOCRIT: 38.5 % (ref 36.0–46.0)
HEMOGLOBIN: 12.9 g/dL (ref 12.0–15.0)
LYMPHS PCT: 16 %
Lymphs Abs: 0.7 10*3/uL (ref 0.7–4.0)
MCH: 27.4 pg (ref 26.0–34.0)
MCHC: 33.5 g/dL (ref 30.0–36.0)
MCV: 81.9 fL (ref 78.0–100.0)
MONO ABS: 0.7 10*3/uL (ref 0.1–1.0)
Monocytes Relative: 16 %
NEUTROS PCT: 68 %
Neutro Abs: 3 10*3/uL (ref 1.7–7.7)
Platelets: 201 10*3/uL (ref 150–400)
RBC: 4.7 MIL/uL (ref 3.87–5.11)
RDW: 13.6 % (ref 11.5–15.5)
WBC: 4.5 10*3/uL (ref 4.0–10.5)

## 2017-03-21 LAB — URINALYSIS, ROUTINE W REFLEX MICROSCOPIC
Bilirubin Urine: NEGATIVE
Glucose, UA: NEGATIVE mg/dL
KETONES UR: 5 mg/dL — AB
Leukocytes, UA: NEGATIVE
Nitrite: NEGATIVE
PROTEIN: NEGATIVE mg/dL
Specific Gravity, Urine: 1.017 (ref 1.005–1.030)
pH: 5 (ref 5.0–8.0)

## 2017-03-21 MED ORDER — CEPHALEXIN 500 MG PO CAPS: 500.0000 mg | ORAL_CAPSULE | Freq: Four times a day (QID) | ORAL | 0 refills | Status: DC

## 2017-03-21 MED ORDER — FENTANYL CITRATE (PF) 100 MCG/2ML IJ SOLN
50.0000 ug | Freq: Once | INTRAMUSCULAR | Status: AC
Start: 2017-03-21 — End: 2017-03-21
  Administered 2017-03-21: 50 ug via INTRAVENOUS
  Filled 2017-03-21: qty 2

## 2017-03-21 NOTE — ED Notes (Signed)
Pt has ambulated to the bathroom without any difficulty, reports she is ready to go home. I have relayed this to the attending EDP.

## 2017-03-21 NOTE — ED Triage Notes (Signed)
Patient her with complaints of multiple falls over the past week. Reports falling and hitting head last night. Bruising and swelling to left eye. Reports that she is unable to open up jaw.

## 2017-03-21 NOTE — ED Provider Notes (Signed)
Emergency Department Provider Note   I have reviewed the triage vital signs and the nursing notes.   HISTORY  Chief Complaint Fall; Head Injury; and Eye Pain   HPI Jill Chandler is a 72 y.o. female with multiple medical problems as documented below the presents to the emergency department today for generalized weakness and multiple falls most recently she was walking down her steep driveway yesterday she fell forward hitting her face and sustaining bruising around her eye left jaw pain right wrist and elbow pain and difficulty getting up secondary to balance and weakness.  She had a fall a while back with similar situation where she fell out of bed she had trouble getting up.  Does not really have any other symptoms but she did get a pneumonia shot recently which she thinks is related as she has some type of viral illness following that.  She has had some polyuria recently.  She recently stopped her metformin with excellent been the last couple days. No other associated or modifying symptoms.    Past Medical History:  Diagnosis Date  . ADHD (attention deficit hyperactivity disorder)   . Adnexal mass   . Barrett's esophagus 03/01/2013  . 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 Depression   . Folic acid deficiency   . GERD (gastroesophageal reflux disease)   . History of adenomatous polyp of colon    TUBULAR ADENOM'S  . History of cervical dysplasia   . History of ovarian cystectomy    approx. 1981  . History of squamous cell carcinoma in situ    ANAL LESIONS WITH S/P  EXCISIONAL BX 'S--- LOCALIZED--- in doctor office  . 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 Hyperlipidemia   . Iron deficiency anemia   . OA (osteoarthritis)   . Osteoporosis   . Rosacea, acne   . Type 2 diabetes mellitus ([place])   . Uterine fibroid     Patient Active Problem List    Diagnosis Date Noted  . Essential hypertension 03/17/2017  . Memory impairment 03/16/2017  . Need for hepatitis C screening test 03/16/2017  . Other folate deficiency anemias 09/22/2016  . Rosacea, acne 09/21/2016  . Dupuytren's contracture of left hand 09/21/2016  . Mild intermittent asthma without complication 42/59/5638  . Rotator cuff arthropathy 08/12/2015  . Barrett's esophagus 03/01/2013  . Osteoporosis, senile 11/30/2012  . Depression, controlled 10/31/2012  . ADD (attention deficit disorder) 10/31/2012  . Type II diabetes mellitus with manifestations ([place]) 04/10/2011  . Nonspecific abnormal electrocardiogram (ECG) (EKG) 03/12/2011  . Deficiency anemia 01/13/2011  . Hyperlipidemia with target LDL less than 100 03/08/2009  . Iron deficiency anemia 03/08/2009  . GERD 03/08/2009    Past Surgical History:  Procedure Laterality Date  . ANTERIOR CERVICAL DECOMP/DISCECTOMY FUSION  11-12-2005  dr Vertell Limber   C3--4  . BREAST REDUCTION SURGERY Bilateral 11-24-2004   dr Georgia Lopes  . CATARACT EXTRACTION W/ INTRAOCULAR LENS  IMPLANT, BILATERAL  early 2000s approx.  . COLONOSCOPY  last one 02-24-2013  . ESOPHAGOGASTRODUODENOSCOPY  12/02/2015  . LAPAROSCOPIC BILATERAL SALPINGO OOPHERECTOMY Bilateral 10/06/2016   Procedure: LAPAROSCOPIC BILATERAL SALPINGO OOPHORECTOMY;  Surgeon: Paula Compton, MD;  Location: [facility];  Service: Gynecology;  Laterality: Bilateral;  . LEEP  YRS AGO   cervical dysplasia  . OVARIAN CYST  REMOVAL  1981 approx.  . TRANSTHORACIC ECHOCARDIOGRAM  04/11/2008   ef 53-66%, grade 1 diastolic dysfunction  . VULVECTOMY PARTIAL  1978 approx.    Current Outpatient Rx  . Order #: [id number] Class: Print  . Order #: [id number] Class: Normal  . Order #: [id number] Class: Historical Med  . Order #: [id number] Class: Historical Med  . Order #: [id number] Class: Historical Med  . Order #: [id number] Class: Historical Med  . Order #: [id number] Class: Normal  .  Order #: [id number] Class: Normal  . Order #: [id number] Class: Normal  . Order #: [id number] Class: Print  . Order #: [id number] Class: Normal  . Order #: [id number] Class: Normal  . Order #: [id number] Class: Normal  . Order #: [id number] Class: Normal  . Order #: [id number] Class: Print    Allergies Patient has no known allergies.  Family History  Problem Relation Age of Onset  . Breast cancer Sister 26  . Diabetes Sister   . Hypertension Sister   . Breast cancer Sister 41  . Alcohol abuse Other   . Drug abuse Other   . Hypertension Other   . Hyperlipidemia Other   . Heart disease Mother   . Osteoporosis Sister   . Cancer Neg Hx   . Early death Neg Hx   . Hearing loss Neg Hx   . Kidney disease Neg Hx   . Learning disabilities Neg Hx   . Stroke Neg Hx   . Colon cancer Neg Hx     Social History Social History   Tobacco Use  . Smoking status: Former Smoker    Years: 30.00    Types: Cigarettes    Last attempt to quit: 09/07/1998    Years since quitting: 18.5  . Smokeless tobacco: Never Used  Substance Use Topics  . Alcohol use: Yes    Comment: Occasionally  . Drug use: No    Review of Systems  All other systems negative except as documented in the HPI. All pertinent positives and negatives as reviewed in the HPI. ____________________________________________   PHYSICAL EXAM:  VITAL SIGNS: ED Triage Vitals [03/21/17 1417]  Enc Vitals Group     BP 113/78     Pulse Rate 98     Resp 18     Temp 98.8 F (37.1 C)     Temp Source Oral     SpO2 100 %    Constitutional: Alert and oriented. Well appearing and in no acute distress. Eyes: Conjunctivae are normal. PERRL. EOMI. Head: right eye ecchymosis and edema, abrasion to submental area. Nose: No congestion/rhinnorhea. Mouth/Throat: Mucous membranes are moist.  Oropharynx non-erythematous. Opens mouth approx 50% of expected. No obvious deformities.  Neck: No stridor.  No meningeal signs.   Cardiovascular: Normal rate,  regular rhythm. Good peripheral circulation. Grossly normal heart sounds.   Respiratory: Normal respiratory effort.  No retractions. Lungs CTAB. Gastrointestinal: Soft and nontender. No distention.  Musculoskeletal: No lower extremity tenderness nor edema. No gross deformities of extremities. Neurologic:  Normal speech and language. No gross focal neurologic deficits are appreciated.  Skin:  Skin is warm, dry and intact. No rash noted.   ____________________________________________   LABS (all labs ordered are listed, but only abnormal results are displayed)  Labs Reviewed  COMPREHENSIVE METABOLIC PANEL - Abnormal; Notable for the following components:      Result Value   Sodium 131 (*)    Chloride 95 (*)    Glucose, Bld 134 (*)    Albumin 3.4 (*)    All other components within normal limits  URINALYSIS,  ROUTINE W REFLEX MICROSCOPIC - Abnormal; Notable for the following components:   Hgb urine dipstick SMALL (*)    Ketones, ur 5 (*)    Bacteria, UA RARE (*)    Squamous Epithelial / LPF 0-5 (*)    All other components within normal limits  URINE CULTURE  CBC WITH DIFFERENTIAL/PLATELET   ____________________________________________  EKG   EKG Interpretation  Date/Time:  Sunday March 21 2017 16:19:26 EST Ventricular Rate:  76 PR Interval:    QRS Duration: 78 QT Interval:  367 QTC Calculation: 413 R Axis:   59 Text Interpretation:  Sinus rhythm Low voltage, precordial leads No significant change since last tracing Confirmed by Merrily Pew [phone]) on 03/21/2017 10:09:29 PM       ____________________________________________  RADIOLOGY  Dg Chest 2 View  Result Date: 03/21/2017 CLINICAL DATA:  Pt has had multiple falls over the last week. Also c/o cough x 5 days. H/o PNA and Bronchitis per patient. H/o type 2 diabetes per chart. Former smoker. EXAM: CHEST  2 VIEW COMPARISON:  11/27/2015 FINDINGS: Cardiac silhouette is normal in size and configuration. Normal  mediastinal and hilar contours. Lungs are hyperexpanded but clear. No pleural effusion or pneumothorax. Skeletal structures are intact. IMPRESSION: No active cardiopulmonary disease. Electronically Signed   By: Lajean Manes M.D.   On: 03/21/2017 16:00   Dg Forearm Right  Result Date: 03/21/2017 CLINICAL DATA:  Recent fall last night. C/o pain to right distal ulnar forearm radiating into 5th metacarpal of right hand. EXAM: RIGHT FOREARM - 2 VIEW COMPARISON:  None. FINDINGS: There is no evidence of fracture or other focal bone lesions. Soft tissues are unremarkable. IMPRESSION: Negative. Electronically Signed   By: Lajean Manes M.D.   On: 03/21/2017 16:42   Ct Head Wo Contrast  Result Date: 03/21/2017 CLINICAL DATA:  Facial trauma with multiple falls. EXAM: CT HEAD WITHOUT CONTRAST CT MAXILLOFACIAL WITHOUT CONTRAST TECHNIQUE: Multidetector CT imaging of the head and maxillofacial structures were performed using the standard protocol without intravenous contrast. Multiplanar CT image reconstructions of the maxillofacial structures were also generated. COMPARISON:  Head CT 07/10/2005 FINDINGS: CT HEAD FINDINGS Brain: No mass lesion, intraparenchymal hemorrhage or extra-axial collection. No evidence of acute cortical infarct. Brain parenchyma and CSF-containing spaces are normal for age. Vascular: No hyperdense vessel or unexpected calcification. Skull: No calvarial fracture. Normal skull base. CT MAXILLOFACIAL FINDINGS Osseous: --Complex facial fracture types: No LeFort, zygomaticomaxillary complex or nasoorbitoethmoidal fracture. --Simple fracture types: There are minimally displaced fractures of the anterior and lateral walls of the right maxillary sinus and right orbital floor. --Mandible, hard palate and teeth: No acute abnormality. Orbits: The globes are intact. Small amount of extraconal emphysema inferiorly. Sinuses: There is right maxillary hemosinus. Small retention cysts lower left maxillary sinus.  Soft tissues: There is edema and subcutaneous emphysema of the right pre maxillary facial soft tissues secondary to obstruction of the anterior wall right maxillary sinus. IMPRESSION: 1. No acute intracranial abnormality. 2. 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 Electronically Signed   By: Ulyses Jarred M.D.   On: 03/21/2017 16:11   Dg Hand Complete Right  Result Date: 03/21/2017 CLINICAL DATA:  Recent fall last night. C/o pain to right distal ulnar forearm radiating into 5th metacarpal of right hand. EXAM: RIGHT HAND - COMPLETE 3+ VIEW COMPARISON:  None. FINDINGS: No fracture.  No bone  lesion. Joints are normally aligned. There is mild asymmetric joint space narrowing and small marginal osteophytes involving several of the interphalangeal joints consistent with osteoarthritis. Soft tissues are unremarkable. IMPRESSION: No fracture or dislocation. Electronically Signed   By: Lajean Manes M.D.   On: 03/21/2017 16:44   Ct Maxillofacial Wo Contrast  Result Date: 03/21/2017 CLINICAL DATA:  Facial trauma with multiple falls. EXAM: CT HEAD WITHOUT CONTRAST CT MAXILLOFACIAL WITHOUT CONTRAST TECHNIQUE: Multidetector CT imaging of the head and maxillofacial structures were performed using the standard protocol without intravenous contrast. Multiplanar CT image reconstructions of the maxillofacial structures were also generated. COMPARISON:  Head CT 07/10/2005 FINDINGS: CT HEAD FINDINGS Brain: No mass lesion, intraparenchymal hemorrhage or extra-axial collection. No evidence of acute cortical infarct. Brain parenchyma and CSF-containing spaces are normal for age. Vascular: No hyperdense vessel or unexpected calcification. Skull: No calvarial fracture. Normal skull base. CT MAXILLOFACIAL FINDINGS Osseous: --Complex facial fracture types: No  LeFort, zygomaticomaxillary complex or nasoorbitoethmoidal fracture. --Simple fracture types: There are minimally displaced fractures of the anterior and lateral walls of the right maxillary sinus and right orbital floor. --Mandible, hard palate and teeth: No acute abnormality. Orbits: The globes are intact. Small amount of extraconal emphysema inferiorly. Sinuses: There is right maxillary hemosinus. Small retention cysts lower left maxillary sinus. Soft tissues: There is edema and subcutaneous emphysema of the right pre maxillary facial soft tissues secondary to obstruction of the anterior wall right maxillary sinus. IMPRESSION: 1. No acute intracranial abnormality. 2. 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 Electronically Signed   By: Ulyses Jarred M.D.   On: 03/21/2017 16:11    ____________________________________________   INITIAL IMPRESSION / ASSESSMENT AND PLAN / ED COURSE  Workup mostly unremarkable.  Suspect her jaw pain is secondary to the maxillary sinus fractures.  She will follow with ENT for that.  She will continue to follow with her primary doctor for generalized weakness.  Return here for any new or worsening symptoms.  Such as vision changes, difficulty moving her eye, infection.     Pertinent labs & imaging results that were available during my care of the patient were reviewed by me and considered in my medical decision making (see chart for details).  ____________________________________________  FINAL CLINICAL IMPRESSION(S) / ED DIAGNOSES  Final diagnoses:  Closed fracture of maxillary sinus, initial encounter ([place])  Fall, initial encounter     MEDICATIONS GIVEN DURING THIS VISIT:  Medications  fentaNYL (SUBLIMAZE) injection 50 mcg (50 mcg Intravenous Given 03/21/17 1830)     NEW OUTPATIENT  MEDICATIONS STARTED DURING THIS VISIT:  Discharge Medication List as of 03/21/2017  9:18 PM    START taking these medications   Details  cephALEXin (KEFLEX) 500 MG capsule Take 1 capsule (500 mg total) by mouth 4 (four) times daily., Starting Sun 03/21/2017, Print        Note:  This note was prepared with assistance of Dragon voice recognition software. Occasional wrong-word or sound-a-like substitutions may have occurred due to the inherent limitations of voice recognition software.   Merrily Pew, MD 03/21/17 2210

## 2017-03-22 ENCOUNTER — Encounter: Payer: Self-pay | Admitting: Internal Medicine

## 2017-03-22 ENCOUNTER — Other Ambulatory Visit: Payer: Self-pay | Admitting: Internal Medicine

## 2017-03-22 DIAGNOSIS — F900 Attention-deficit hyperactivity disorder, predominantly inattentive type: Secondary | ICD-10-CM

## 2017-03-22 MED ORDER — AMPHETAMINE-DEXTROAMPHETAMINE 10 MG PO TABS: 10.0000 mg | ORAL_TABLET | Freq: Two times a day (BID) | ORAL | 0 refills | Status: DC

## 2017-03-22 NOTE — Telephone Encounter (Signed)
Pt is needing to make Er follow-up w/Dr. Ronnald Ramp.Marland KitchenJohny Chess

## 2017-03-23 ENCOUNTER — Other Ambulatory Visit: Payer: Self-pay | Admitting: Internal Medicine

## 2017-03-23 DIAGNOSIS — F900 Attention-deficit hyperactivity disorder, predominantly inattentive type: Secondary | ICD-10-CM

## 2017-03-23 MED ORDER — AMPHETAMINE-DEXTROAMPHETAMINE 10 MG PO TABS: 10.0000 mg | ORAL_TABLET | Freq: Two times a day (BID) | ORAL | 0 refills | Status: DC

## 2017-03-26 ENCOUNTER — Encounter: Payer: Self-pay | Admitting: Family

## 2017-03-26 ENCOUNTER — Other Ambulatory Visit (INDEPENDENT_AMBULATORY_CARE_PROVIDER_SITE_OTHER): Payer: Medicare Other

## 2017-03-26 ENCOUNTER — Ambulatory Visit: Payer: Medicare Other | Admitting: Family

## 2017-03-26 VITALS — BP 132/78 | HR 84 | Temp 98.0°F | Ht 62.0 in | Wt 113.1 lb

## 2017-03-26 DIAGNOSIS — R899 Unspecified abnormal finding in specimens from other organs, systems and tissues: Secondary | ICD-10-CM

## 2017-03-26 DIAGNOSIS — F900 Attention-deficit hyperactivity disorder, predominantly inattentive type: Secondary | ICD-10-CM

## 2017-03-26 LAB — COMPREHENSIVE METABOLIC PANEL
ALK PHOS: 62 U/L (ref 39–117)
ALT: 21 U/L (ref 0–35)
AST: 16 U/L (ref 0–37)
Albumin: 3.7 g/dL (ref 3.5–5.2)
BILIRUBIN TOTAL: 0.2 mg/dL (ref 0.2–1.2)
BUN: 11 mg/dL (ref 6–23)
CALCIUM: 9.6 mg/dL (ref 8.4–10.5)
CO2: 30 mEq/L (ref 19–32)
CREATININE: 0.64 mg/dL (ref 0.40–1.20)
Chloride: 101 mEq/L (ref 96–112)
GFR: 97.64 mL/min (ref >60.00)
GLUCOSE: 151 mg/dL — AB (ref 70–99)
Potassium: 4 mEq/L (ref 3.5–5.1)
Sodium: 138 mEq/L (ref 135–145)
TOTAL PROTEIN: 7 g/dL (ref 6.0–8.3)

## 2017-03-26 LAB — URINE CULTURE

## 2017-03-26 MED ORDER — AMPHETAMINE-DEXTROAMPHETAMINE 10 MG PO TABS: 10.0000 mg | ORAL_TABLET | Freq: Two times a day (BID) | ORAL | 0 refills | Status: DC

## 2017-03-27 LAB — URINE CULTURE
MICRO NUMBER:: 90268425
Result:: NO GROWTH
SPECIMEN QUALITY: ADEQUATE

## 2017-03-28 ENCOUNTER — Encounter: Payer: Self-pay | Admitting: Family

## 2017-03-29 ENCOUNTER — Encounter: Payer: Self-pay | Admitting: Family

## 2017-03-29 NOTE — Progress Notes (Signed)
Jill Chandler is a 72 y.o. female with the following history as recorded in [employer]:  Patient Active Problem List   Diagnosis Date Noted  . Essential hypertension 03/17/2017  . Memory impairment 03/16/2017  . Need for hepatitis C screening test 03/16/2017  . Other folate deficiency anemias 09/22/2016  . Rosacea, acne 09/21/2016  . Dupuytren's contracture of left hand 09/21/2016  . Mild intermittent asthma without complication 04/59/9774  . Rotator cuff arthropathy 08/12/2015  . Barrett's esophagus 03/01/2013  . Osteoporosis, senile 11/30/2012  . Depression, controlled 10/31/2012  . ADD (attention deficit disorder) 10/31/2012  . Type II diabetes mellitus with manifestations ([place]) 04/10/2011  . Nonspecific abnormal electrocardiogram (ECG) (EKG) 03/12/2011  . Deficiency anemia 01/13/2011  . Hyperlipidemia with target LDL less than 100 03/08/2009  . Iron deficiency anemia 03/08/2009  . GERD 03/08/2009    Current Outpatient Medications  Medication Sig Dispense Refill  . amphetamine-dextroamphetamine (ADDERALL) 10 MG tablet Take 1 tablet (10 mg total) by mouth 2 (two) times daily with a meal. 180 tablet 0  . buPROPion (WELLBUTRIN XL) 150 MG 24 hr tablet Take 1 tablet (150 mg total) by mouth every morning. 90 tablet 1  . Calcium Carbonate-Vitamin D (CALCIUM + D PO) Take 1 tablet by mouth daily.     . cephALEXin (KEFLEX) 500 MG capsule Take 1 capsule (500 mg total) by mouth 4 (four) times daily. 28 capsule 0  . Cyanocobalamin (B-12) 2500 MCG SUBL Place 1 tablet under the tongue 3 (three) times a week.    . ferrous sulfate 324 (65 Fe) MG TBEC Take 1 tablet by mouth 2 (two) times daily.    . fish oil-omega-3 fatty acids 1000 MG capsule Take 1 g by mouth daily.     Marland Kitchen FLUoxetine (PROZAC) 40 MG capsule Take 1 capsule (40 mg total) by mouth 2 (two) times daily. (Patient taking differently: Take 80 mg by mouth daily. ) 142 capsule 1  . folic acid (FOLVITE) 1 MG tablet TAKE 1 TABLET(1 MG) BY MOUTH  DAILY 90 tablet 1  . glucose blood (ONETOUCH VERIO) test strip Use to check blood sugar twice a day DX:E11.8 200 each 1  . ibandronate (BONIVA) 150 MG tablet Take 1 tablet (150 mg total) by mouth every 30 (thirty) days. Take in the morning with a full glass of water, on an empty stomach, and do not take anything else by mouth or lie down for the next 30 min. 3 tablet 3  . metroNIDAZOLE (METROGEL) 0.75 % gel Apply 1 application topically 2 (two) times daily. 45 g 5  . omeprazole (PRILOSEC) 20 MG capsule Take 1 capsule (20 mg total) by mouth daily. 90 capsule 1  . simvastatin (ZOCOR) 40 MG tablet TAKE 1 BY MOUTH AT BEDTIME (Patient taking differently: Take 40 mg by mouth daily at 6 PM. TAKE 1 BY MOUTH AT BEDTIME) 90 tablet 3  . traZODone (DESYREL) 150 MG tablet TAKE 1 TABLET BY MOUTH AT BEDTIME AS NEEDED FOR SLEEP (Patient taking differently: Take 150 mg by mouth at bedtime. TAKE 1 TABLET BY MOUTH AT BEDTIME AS NEEDED FOR SLEEP) 90 tablet 1   No current facility-administered medications for this visit.     Allergies: Patient has no known allergies.  Past Medical History:  Diagnosis Date  . ADHD (attention deficit hyperactivity disorder)   . Adnexal mass   . Barrett's esophagus 03/01/2013  . 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  s/p  partial vulvectomy--- per pt no issue  since  . Depression   . Folic acid deficiency   . GERD (gastroesophageal reflux disease)   . History of adenomatous polyp of colon    TUBULAR ADENOM'S  . History of cervical dysplasia   . History of ovarian cystectomy    approx. 1981  . History of squamous cell carcinoma in situ    ANAL LESIONS WITH S/P  EXCISIONAL BX 'S--- LOCALIZED--- in doctor office  . 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 Hyperlipidemia   . Iron deficiency anemia   . OA (osteoarthritis)   . Osteoporosis   . Rosacea, acne   . Type 2 diabetes mellitus ([place])   .  Uterine fibroid     Past Surgical History:  Procedure Laterality Date  . ANTERIOR CERVICAL DECOMP/DISCECTOMY FUSION  11-12-2005  dr Vertell Limber   C3--4  . BREAST REDUCTION SURGERY Bilateral 11-24-2004   dr Georgia Lopes  . CATARACT EXTRACTION W/ INTRAOCULAR LENS  IMPLANT, BILATERAL  early 2000s approx.  . COLONOSCOPY  last one 02-24-2013  . ESOPHAGOGASTRODUODENOSCOPY  12/02/2015  . LAPAROSCOPIC BILATERAL SALPINGO OOPHERECTOMY Bilateral 10/06/2016   Procedure: LAPAROSCOPIC BILATERAL SALPINGO OOPHORECTOMY;  Surgeon: Paula Compton, MD;  Location: [place];  Service: Gynecology;  Laterality: Bilateral;  . LEEP  YRS AGO   cervical dysplasia  . OVARIAN CYST REMOVAL  1981 approx.  . TRANSTHORACIC ECHOCARDIOGRAM  04/11/2008   ef 44-03%, grade 1 diastolic dysfunction  . VULVECTOMY PARTIAL  1978 approx.    Family History  Problem Relation Age of Onset  . Breast cancer Sister 29  . Diabetes Sister   . Hypertension Sister   . Breast cancer Sister 72  . Alcohol abuse Other   . Drug abuse Other   . Hypertension Other   . Hyperlipidemia Other   . Heart disease Mother   . Osteoporosis Sister   . Cancer Neg Hx   . Early death Neg Hx   . Hearing loss Neg Hx   . Kidney disease Neg Hx   . Learning disabilities Neg Hx   . Stroke Neg Hx   . Colon cancer Neg Hx     Social History   Tobacco Use  . Smoking status: Former Smoker    Years: 30.00    Types: Cigarettes    Last attempt to quit: 09/07/1998    Years since quitting: 18.5  . Smokeless tobacco: Never Used  Substance Use Topics  . Alcohol use: Yes    Comment: Occasionally    Subjective:  Patient presents with concerns for cough/ fatigue x 10 days; states she has just not felt good since she received a pneumonia vaccine at her last office visit on 2/19; she also fell while walking to her mailbox and was seen in the ER on 2/24; right eye is bruised today and imaging showed closed fracture of one of her sinuses; patient notes  that she just hasn't felt good since there have been complications with her being able to get her Adderall prescription from her last office visit; her dosage was lowered from 20 mg bid to 10 mg bid due to concerns about her blood pressure; unfortunately, the prescription was sent to the wrong pharmacy; she notes it should have been sent to [facility] rather than [facility]; there are notes and e-mail to corroborate her story- she has not filled the prescription and wonders if she can get a  new prescription sent to [facility] today; she admits she is very frustrated by the whole process and just really feels like she needs her medication.   Objective:  Vitals:   03/26/17 1412  BP: 132/78  Pulse: 84  Temp: 98 F (36.7 C)  TempSrc: Oral  SpO2: 98%  Weight: 113 lb 1.9 oz (51.3 kg)  Height: _0  (1.575 m)    General: Well developed, well nourished, in no acute distress  Skin : Warm and dry. Healing bruises noted under left eye Head: Normocephalic and atraumatic  Eyes: Sclera and conjunctiva clear; pupils round and reactive to light; extraocular movements intact  Ears: External normal; canals clear; tympanic membranes normal  Oropharynx: Pink, supple. No suspicious lesions  Neck: Supple without thyromegaly, adenopathy  Lungs: Respirations unlabored; clear to auscultation bilaterally without wheeze, rales, rhonchi  CVS exam: normal rate and regular rhythm.  Vessels: Symmetric bilaterally  Neurologic: Alert and oriented; speech intact; face symmetrical; moves all extremities well; CNII-XII intact without focal deficit   Assessment:  1. Abnormal laboratory test result   2. Attention deficit hyperactivity disorder (ADHD), predominantly inattentive type     Plan:  1. Reviewed ER notes and labs with patient while she was in the office; reassurance that CXR was normal; suspect cough is resolving viral illness and low suspicion for complication from vaccine; will repeat CMP as sodium was low at  the ER; follow-up to be determined. 2. Rx for Adderall 10 mg bid sent to [facility] as requested; she is given a 3 month supply and understands she has to see her PCP in follow-up.   Time spent with patient 30 minutes; greater than 50% with medical counseling, chart review and treatment plan.  No Follow-up on file.  Orders Placed This Encounter  Procedures  . Urine Culture    Standing Status:   Future    Number of Occurrences:   1    Standing Expiration Date:   03/26/2018  . Comp Met (CMET)    Standing Status:   Future    Number of Occurrences:   1    Standing Expiration Date:   03/26/2018    Requested Prescriptions   Signed Prescriptions Disp Refills  . amphetamine-dextroamphetamine (ADDERALL) 10 MG tablet 180 tablet 0    Sig: Take 1 tablet (10 mg total) by mouth 2 (two) times daily with a meal.

## 2017-03-30 ENCOUNTER — Encounter: Payer: Self-pay | Admitting: Internal Medicine

## 2017-03-30 ENCOUNTER — Ambulatory Visit: Payer: Medicare Other | Admitting: Internal Medicine

## 2017-03-31 ENCOUNTER — Telehealth: Payer: Self-pay

## 2017-03-31 NOTE — Telephone Encounter (Signed)
KEY: [id number]

## 2017-04-02 NOTE — Telephone Encounter (Signed)
Holding PA until I get some clarification from PCP about the dosage.

## 2017-04-13 ENCOUNTER — Ambulatory Visit
Admission: RE | Admit: 2017-04-13 | Discharge: 2017-04-13 | Disposition: A | Discharge status: Home / Self Care | Payer: Medicare Other | Source: Ambulatory Visit | Attending: Endocrinology | Admitting: Endocrinology

## 2017-04-13 ENCOUNTER — Encounter: Payer: Self-pay | Admitting: Endocrinology

## 2017-04-13 ENCOUNTER — Ambulatory Visit: Payer: Medicare Other | Admitting: Endocrinology

## 2017-04-13 VITALS — BP 136/78 | HR 92 | Wt 117.0 lb

## 2017-04-13 DIAGNOSIS — M810 Age-related osteoporosis without current pathological fracture: Secondary | ICD-10-CM | POA: Diagnosis not present

## 2017-04-13 NOTE — Patient Instructions (Addendum)
Please continue the same medications.   Let's recheck the bone density.  If it is not getting better, we'll add "Prolia" (twice a year shot given here at the office).   It is critically important to prevent falling down (keep floor areas well-lit, dry, and free of loose objects.  If you have a cane, walker, or wheelchair, you should use it, even for short trips around the house.  Wear flat-soled shoes.  Also, try not to rush).  Please return in 1 year.

## 2017-04-13 NOTE — Progress Notes (Signed)
Subjective:    Patient ID: Jill Chandler, female    DOB: [birth year], 72 y.o.   MRN: [id number]  HPI  Pt returns for f/u of osteoporosis: Dx'ed:2002 Secondary cause: none found Fractures: several facial (2019), due to a fall.   Past rx: reclast (2017) Current rx: boniva, since 2018 Last DEXA result:  Other: she takes OTC vit-D and Ca++ supplements Interval hx: She fell 3 weeks ago, striking her head.  She tolerates the boniva well.   Past Medical History:  Diagnosis Date  . ADHD (attention deficit hyperactivity disorder)   . Adnexal mass   . Barrett's esophagus 03/01/2013  . 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 Depression   . Folic acid deficiency   . GERD (gastroesophageal reflux disease)   . History of adenomatous polyp of colon    TUBULAR ADENOM'S  . History of cervical dysplasia   . History of ovarian cystectomy    approx. 1981  . History of squamous cell carcinoma in situ    ANAL LESIONS WITH S/P  EXCISIONAL BX 'S--- LOCALIZED--- in doctor office  . 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 Hyperlipidemia   . Iron deficiency anemia   . OA (osteoarthritis)   . Osteoporosis   . Rosacea, acne   . Type 2 diabetes mellitus ([place])   . Uterine fibroid     Past Surgical History:  Procedure Laterality Date  . ANTERIOR CERVICAL DECOMP/DISCECTOMY FUSION  11-12-2005  dr Vertell Limber   C3--4  . BREAST REDUCTION SURGERY Bilateral 11-24-2004   dr Georgia Lopes  . CATARACT EXTRACTION W/ INTRAOCULAR LENS  IMPLANT, BILATERAL  early 2000s approx.  . COLONOSCOPY  last one 02-24-2013  . ESOPHAGOGASTRODUODENOSCOPY  12/02/2015  . LAPAROSCOPIC BILATERAL SALPINGO OOPHERECTOMY Bilateral 10/06/2016   Procedure: LAPAROSCOPIC BILATERAL SALPINGO OOPHORECTOMY;  Surgeon: Paula Compton, MD;  Location: [facility];  Service: Gynecology;  Laterality: Bilateral;    . LEEP  YRS AGO   cervical dysplasia  . OVARIAN CYST REMOVAL  1981 approx.  . TRANSTHORACIC ECHOCARDIOGRAM  04/11/2008   ef 33-29%, grade 1 diastolic dysfunction  . VULVECTOMY PARTIAL  1978 approx.    Social History   Socioeconomic History  . Marital status: Single    Spouse name: Not on file  . Number of children: Not on file  . Years of education: Not on file  . Highest education level: Not on file  Occupational History  . Not on file  Social Needs  . Financial resource strain: Not on file  . Food insecurity:    Worry: Not on file    Inability: Not on file  . Transportation needs:    Medical: Not on file    Non-medical: Not on file  Tobacco Use  . Smoking status: Former Smoker    Years: 30.00    Types: Cigarettes    Last attempt to quit: 09/07/1998    Years since quitting: 18.6  . Smokeless tobacco: Never Used  Substance and Sexual Activity  . Alcohol use: Yes    Comment: Occasionally  . Drug use: No  . Sexual activity: Yes    Birth control/protection: Post-menopausal  Lifestyle  . Physical activity:    Days per week: Not on file    Minutes per session: Not on file  . Stress: Not on  file  Relationships  . Social connections:    Talks on phone: Not on file    Gets together: Not on file    Attends religious service: Not on file    Active member of club or organization: Not on file    Attends meetings of clubs or organizations: Not on file    Relationship status: Not on file  . Intimate partner violence:    Fear of current or ex partner: Not on file    Emotionally abused: Not on file    Physically abused: Not on file    Forced sexual activity: Not on file  Other Topics Concern  . 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 3 caffeinated beverages daily.          Current Outpatient Medications on File Prior to Visit  Medication Sig Dispense Refill  .  amphetamine-dextroamphetamine (ADDERALL) 10 MG tablet Take 1 tablet (10 mg total) by mouth 2 (two) times daily with a meal. 180 tablet 0  . buPROPion (WELLBUTRIN XL) 150 MG 24 hr tablet Take 1 tablet (150 mg total) by mouth every morning. 90 tablet 1  . Calcium Carbonate-Vitamin D (CALCIUM + D PO) Take 1 tablet by mouth daily.     . cephALEXin (KEFLEX) 500 MG capsule Take 1 capsule (500 mg total) by mouth 4 (four) times daily. 28 capsule 0  . Cyanocobalamin (B-12) 2500 MCG SUBL Place 1 tablet under the tongue 3 (three) times a week.    . ferrous sulfate 324 (65 Fe) MG TBEC Take 1 tablet by mouth 2 (two) times daily.    . fish oil-omega-3 fatty acids 1000 MG capsule Take 1 g by mouth daily.     Marland Kitchen FLUoxetine (PROZAC) 40 MG capsule Take 1 capsule (40 mg total) by mouth 2 (two) times daily. (Patient taking differently: Take 80 mg by mouth daily. ) 161 capsule 1  . folic acid (FOLVITE) 1 MG tablet TAKE 1 TABLET(1 MG) BY MOUTH DAILY 90 tablet 1  . glucose blood (ONETOUCH VERIO) test strip Use to check blood sugar twice a day DX:E11.8 200 each 1  . ibandronate (BONIVA) 150 MG tablet Take 1 tablet (150 mg total) by mouth every 30 (thirty) days. Take in the morning with a full glass of water, on an empty stomach, and do not take anything else by mouth or lie down for the next 30 min. 3 tablet 3  . metroNIDAZOLE (METROGEL) 0.75 % gel Apply 1 application topically 2 (two) times daily. 45 g 5  . omeprazole (PRILOSEC) 20 MG capsule Take 1 capsule (20 mg total) by mouth daily. 90 capsule 1  . simvastatin (ZOCOR) 40 MG tablet TAKE 1 BY MOUTH AT BEDTIME (Patient taking differently: Take 40 mg by mouth daily at 6 PM. TAKE 1 BY MOUTH AT BEDTIME) 90 tablet 3  . traZODone (DESYREL) 150 MG tablet TAKE 1 TABLET BY MOUTH AT BEDTIME AS NEEDED FOR SLEEP (Patient taking differently: Take 150 mg by mouth at bedtime. TAKE 1 TABLET BY MOUTH AT BEDTIME AS NEEDED FOR SLEEP) 90 tablet 1   No current facility-administered  medications on file prior to visit.     No Known Allergies  Family History  Problem Relation Age of Onset  . Breast cancer Sister 26  . Diabetes Sister   . Hypertension Sister   . Breast cancer Sister 69  . Alcohol abuse Other   .  Drug abuse Other   . Hypertension Other   . Hyperlipidemia Other   . Heart disease Mother   . Osteoporosis Sister   . Cancer Neg Hx   . Early death Neg Hx   . Hearing loss Neg Hx   . Kidney disease Neg Hx   . Learning disabilities Neg Hx   . Stroke Neg Hx   . Colon cancer Neg Hx     BP 136/78 (BP Location: Left Arm, Patient Position: Sitting, Cuff Size: Normal)   Pulse 92   Wt 117 lb (53.1 kg)   SpO2 93%   BMI 21.40 kg/m    Review of Systems  She has slight dizziness.      Objective:   Physical Exam VITAL SIGNS:  See vs page GENERAL: no distress Spine: no kyphosis Gait: normal and steady.   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    Assessment & Plan:  Osteoporosis, due for recheck.   Patient Instructions  Please continue the same medications.   Let's recheck the bone density.  If it is not getting better, we'll add "Prolia" (twice a year shot given here at the office).   It is critically important to prevent falling down (keep floor areas well-lit, dry, and free of loose objects.  If you have a cane, walker, or wheelchair, you should use it, even for short trips around the house.  Wear flat-soled shoes.  Also, try not to rush).  Please return in 1 year.

## 2017-04-21 ENCOUNTER — Other Ambulatory Visit: Payer: Self-pay | Admitting: Endocrinology

## 2017-04-26 ENCOUNTER — Ambulatory Visit (INDEPENDENT_AMBULATORY_CARE_PROVIDER_SITE_OTHER)
Admission: RE | Admit: 2017-04-26 | Discharge: 2017-04-26 | Disposition: A | Discharge status: Home / Self Care | Payer: Medicare Other | Source: Ambulatory Visit | Attending: Endocrinology | Admitting: Endocrinology

## 2017-04-26 DIAGNOSIS — M810 Age-related osteoporosis without current pathological fracture: Secondary | ICD-10-CM

## 2017-05-06 ENCOUNTER — Telehealth: Payer: Self-pay

## 2017-05-06 NOTE — Telephone Encounter (Signed)
Key: [id number] : Pending Approval via Cover My Meds   Your information has been submitted to [place]. [employer] [place] will review the request and notify you of the determination decision directly, typically within 3 business days of your submission and once all necessary information is received.  You will also receive your request decision electronically. To check for an update later, open the request again from your dashboard.  If [employer]  [place] has not responded within the specified timeframe or if you have any questions about your PA submission, contact [facility] [place] [facility] directly at [facility]) [phone] or ([place]) [phone]

## 2017-05-07 ENCOUNTER — Telehealth: Payer: Self-pay

## 2017-05-07 NOTE — Telephone Encounter (Signed)
Pt is due for an office visit and changing the ADHD medication can be discussed at that visit.   Can you call pt and schedule please? Needs to be a 30 minute appt.

## 2017-05-07 NOTE — Telephone Encounter (Signed)
Copied from [place] [id number]. Topic: General - Other >> May 07, 2017 10:09 AM Yvette Rack wrote: Reason for CRM: Hassan Rowan from [facility] calling stating that the adderal has been denied but pt can try other options strattera  Methylphenidate IR  Adderall ER

## 2017-05-10 ENCOUNTER — Telehealth: Payer: Self-pay | Admitting: Endocrinology

## 2017-05-10 NOTE — Telephone Encounter (Signed)
Appointment scheduled 05/24/17

## 2017-05-10 NOTE — Telephone Encounter (Signed)
Spoke to pharmacist and went over directions

## 2017-05-10 NOTE — Telephone Encounter (Signed)
[facility] ph# -[phone] called re: RX Ibandronate Sodium 150 mg. Dr authorized script but missing directions on how to take the medication. Please contact at the above ph# to advise.

## 2017-05-12 ENCOUNTER — Encounter: Payer: Self-pay | Admitting: Internal Medicine

## 2017-05-14 ENCOUNTER — Other Ambulatory Visit: Payer: Self-pay | Admitting: Internal Medicine

## 2017-05-14 DIAGNOSIS — F32A Depression, unspecified: Secondary | ICD-10-CM

## 2017-05-14 DIAGNOSIS — F329 Major depressive disorder, single episode, unspecified: Secondary | ICD-10-CM

## 2017-05-18 ENCOUNTER — Encounter: Payer: Self-pay | Admitting: Urgent Care

## 2017-05-18 ENCOUNTER — Ambulatory Visit (INDEPENDENT_AMBULATORY_CARE_PROVIDER_SITE_OTHER): Payer: Medicare Other | Admitting: Urgent Care

## 2017-05-18 VITALS — BP 164/80 | HR 95 | Temp 98.0°F | Resp 18 | Ht 62.0 in | Wt 117.4 lb

## 2017-05-18 DIAGNOSIS — F988 Other specified behavioral and emotional disorders with onset usually occurring in childhood and adolescence: Secondary | ICD-10-CM | POA: Diagnosis not present

## 2017-05-18 DIAGNOSIS — R5383 Other fatigue: Secondary | ICD-10-CM

## 2017-05-18 DIAGNOSIS — E119 Type 2 diabetes mellitus without complications: Secondary | ICD-10-CM

## 2017-05-18 DIAGNOSIS — R6889 Other general symptoms and signs: Secondary | ICD-10-CM | POA: Diagnosis not present

## 2017-05-18 DIAGNOSIS — Z820 Family history of epilepsy and other diseases of the nervous system: Secondary | ICD-10-CM

## 2017-05-18 LAB — GLUCOSE, POCT (MANUAL RESULT ENTRY): POC Glucose: 129 mg/dl — AB (ref 70–99)

## 2017-05-18 LAB — POCT GLYCOSYLATED HEMOGLOBIN (HGB A1C): Hemoglobin A1C: 5.9

## 2017-05-18 NOTE — Progress Notes (Signed)
MRN: [id number] DOB: 1945/10/02  Subjective:   Jill Chandler is a 72 y.o. female presenting for 2 to 44-month history of persistent fatigue, mental fog, forgetfulness.  Patient is very concerned that these may be Alzheimer's symptoms.  She also reports being really upset with her previous PCP given that he changed her dose of Adderall from 40 mg to 20 per patient.  She also states that she went about a week without any Adderall because of confusion with her insurance regarding her prescription refill in which she has tried in the past.  She states that she has been on Adderall since the 90s for ADD.  She denies having a history of high blood pressure but admits that she is under a lot of stress due to to her recent health issues including facial trauma from a fall and not being able to get her medications.  She has had extensive work-up with her PCP.  Currently, she does have a consult with neurology, specifically with Dr. Floyde Parkins with [facility].  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  Denies smoking cigarettes.  [place] has a current medication list which includes the following prescription(s): amphetamine-dextroamphetamine, bupropion, calcium citrate-vitamin d, cephalexin, b-12, ferrous sulfate, fish oil-omega-3 fatty acids, fluoxetine, folic acid, glucose blood, ibandronate, metformin, metronidazole, omeprazole, simvastatin, and trazodone. Also has No Known Allergies.  Jill Chandler  has a past medical history of ADHD (attention deficit hyperactivity disorder), Adnexal mass, Barrett's esophagus (03/01/2013), Bowen's disease, Depression, Folic acid deficiency, GERD (gastroesophageal reflux disease), History of adenomatous polyp of colon, History of cervical dysplasia, History of ovarian cystectomy, History of squamous cell carcinoma in situ, History of vulvar dysplasia, Hyperlipidemia, Iron deficiency anemia, OA (osteoarthritis), Osteoporosis, Rosacea, acne, Type 2 diabetes  mellitus ([place]), and Uterine fibroid. Also  has a past surgical history that includes Colonoscopy (last one 02-24-2013); Vulvectomy partial (1978 approx.); LEEP (YRS AGO); Ovarian cyst removal (1981 approx.); Breast reduction surgery (Bilateral, 11-24-2004   dr Georgia Lopes); Anterior cervical decomp/discectomy fusion (11-12-2005  dr Vertell Limber); Esophagogastroduodenoscopy (12/02/2015); transthoracic echocardiogram (04/11/2008); Cataract extraction w/ intraocular lens  implant, bilateral (early 2000s approx.); and Laparoscopic bilateral salpingo oophorectomy (Bilateral, 10/06/2016).  Objective:   Vitals: BP (!) 164/80   Pulse 95   Temp 98 F (36.7 C) (Oral)   Resp 18   Ht 5\' 2"  (1.575 m)   Wt 117 lb 6.4 oz (53.3 kg)   SpO2 97%   BMI 21.47 kg/m   BP Readings from Last 3 Encounters:  05/18/17 (!) 164/80  04/13/17 136/78  03/26/17 132/78   Physical Exam  Constitutional: She is oriented to person, place, and time. She appears well-developed and well-nourished.  Cardiovascular: Normal rate.  Pulmonary/Chest: Effort normal.  Neurological: She is alert and oriented to person, place, and time.  Psychiatric:  Patient is very restless in her seat, changes positions frequently stands and sits frequently.  She appears pressured in her speech.   Results for orders placed or performed in visit on 05/18/17 (from the past 24 hour(s))  POCT glucose (manual entry)     Status: Abnormal   Collection Time: 05/18/17  3:18 PM  Result Value Ref Range   POC Glucose 129 (A) 70 - 99 mg/dl  POCT glycosylated hemoglobin (Hb A1C)     Status: None   Collection Time: 05/18/17  3:23 PM  Result Value Ref Range   Hemoglobin A1C 5.9    Assessment and Plan :   Controlled type 2 diabetes mellitus without complication,  without long-term current use of insulin ([place]) - Plan: POCT glycosylated hemoglobin (Hb A1C), POCT glucose (manual entry)  Other fatigue - Plan: Thyroid Panel With TSH  Forgetfulness  Attention deficit  disorder, unspecified hyperactivity presence - Plan: Ambulatory referral to Psychiatry  Family history of Alzheimer's disease  Reviewed with patient all previous labs done with her PCP.  I reassured her that her anemia is improved and has good levels of her folate, vitamin B12, iron.  I counseled patient on the risks of using Adderall and recommended she get a consult from [place] [facility].  I advised her that I could not manage her ADD and provide her with refills of her Adderall due to the risks involved including her new onset elevated blood pressure.  She states that she will continue to take her medication as prescribed even though she does not necessarily agree with the dose change given to her by her PCP.  I recommended she maintain her appointment with the urologist for consult.  I repeated her thyroid labs although these were normal August 2018.  Patient is in agreement with treatment plan but is hesitant to go to [place] [facility] due to her time constraints.  Follow-up as needed.  Jaynee Eagles, PA-C [facility] [phone] 05/18/2017  3:03 PM

## 2017-05-18 NOTE — Patient Instructions (Addendum)
Amphetamine; Dextroamphetamine tablets What is this medicine? AMPHETAMINE; DEXTROAMPHETAMINE(am FET a meen; dex troe am FET a meen) is used to treat attention-deficit hyperactivity disorder (ADHD). It may also be used for narcolepsy. Federal law prohibits giving this medicine to any person other than the person for whom it was prescribed. Do not share this medicine with anyone else. This medicine may be used for other purposes; ask your health care provider or pharmacist if you have questions. COMMON BRAND NAME(S): Adderall What should I tell my health care provider before I take this medicine? They need to know if you have any of these conditions: -anxiety or panic attacks -circulation problems in fingers and toes -glaucoma -hardening or blockages of the arteries or heart blood vessels -heart disease or a heart defect -high blood pressure -history of a drug or alcohol abuse problem -history of stroke -kidney disease -liver disease -mental illness -seizures -suicidal thoughts, plans, or attempt; a previous suicide attempt by you or a family member -thyroid disease -Tourette's syndrome -an unusual or allergic reaction to dextroamphetamine, other amphetamines, other medicines, foods, dyes, or preservatives -pregnant or trying to get pregnant -breast-feeding How should I use this medicine? Take this medicine by mouth with a glass of water. Follow the directions on the prescription label. Take your doses at regular intervals. Do not take your medicine more often than directed. Do not suddenly stop your medicine. You must gradually reduce the dose or you may feel withdrawal effects. Ask your doctor or health care professional for advice. Talk to your pediatrician regarding the use of this medicine in children. Special care may be needed. While this drug may be prescribed for children as young as 3 years for selected conditions, precautions do apply. Overdosage: If you think you have taken too  much of this medicine contact a poison control center or emergency room at once. NOTE: This medicine is only for you. Do not share this medicine with others. What if I miss a dose? If you miss a dose, take it as soon as you can. If it is almost time for your next dose, take only that dose. Do not take double or extra doses. What may interact with this medicine? Do not take this medicine with any of the following medications: -MAOIS like Carbex, Eldepryl, Marplan, Nardil, and Parnate -other stimulant medicines for attention disorders, weight loss, or to stay awake This medicine may also interact with the following medications: -acetazolamide -ammonium chloride -antacids -ascorbic acid -atomoxetine -caffeine -certain medicines for blood pressure -certain medicines for depression, anxiety, or psychotic disturbances -certain medicines for seizures like carbamazepine, phenobarbital, phenytoin -certain medicines for stomach problems like cimetidine, famotidine, omeprazole, lansoprazole -cold or allergy medicines -glutamic acid -lithium -meperidine -methenamine; sodium acid phosphate -narcotic medicines for pain -norepinephrine -phenothiazines like chlorpromazine, mesoridazine, prochlorperazine, thioridazine -sodium acid phosphate -sodium bicarbonate This list may not describe all possible interactions. Give your health care provider a list of all the medicines, herbs, non-prescription drugs, or dietary supplements you use. Also tell them if you smoke, drink alcohol, or use illegal drugs. Some items may interact with your medicine. What should I watch for while using this medicine? Visit your doctor or health care professional for regular checks on your progress. This prescription requires that you follow special procedures with your doctor and pharmacy. You will need to have a new written prescription from your doctor every time you need a refill. This medicine may affect your  concentration, or hide signs of tiredness. Until you know how this   medicine affects you, do not drive, ride a bicycle, use machinery, or do anything that needs mental alertness. Tell your doctor or health care professional if this medicine loses its effects, or if you feel you need to take more than the prescribed amount. Do not change the dosage without talking to your doctor or health care professional. Decreased appetite is a common side effect when starting this medicine. Eating small, frequent meals or snacks can help. Talk to your doctor if you continue to have poor eating habits. Height and weight growth of a child taking this medicine will be monitored closely. Do not take this medicine close to bedtime. It may prevent you from sleeping. If you are going to need surgery, a MRI, CT scan, or other procedure, tell your doctor that you are taking this medicine. You may need to stop taking this medicine before the procedure. Tell your doctor or healthcare professional right away if you notice unexplained wounds on your fingers and toes while taking this medicine. You should also tell your healthcare provider if you experience numbness or pain, changes in the skin color, or sensitivity to temperature in your fingers or toes. What side effects may I notice from receiving this medicine? Side effects that you should report to your doctor or health care professional as soon as possible: -allergic reactions like skin rash, itching or hives, swelling of the face, lips, or tongue -changes in vision -chest pain or chest tightness -confusion, trouble speaking or understanding -fast, irregular heartbeat -fingers or toes feel numb, cool, painful -hallucination, loss of contact with reality -high blood pressure -males: prolonged or painful erection -seizures -severe headaches -shortness of breath -suicidal thoughts or other mood changes -trouble walking, dizziness, loss of balance or  coordination -uncontrollable head, mouth, neck, arm, or leg movements Side effects that usually do not require medical attention (report to your doctor or health care professional if they continue or are bothersome): -anxious -headache -loss of appetite -nausea, vomiting -trouble sleeping -weight loss This list may not describe all possible side effects. Call your doctor for medical advice about side effects. You may report side effects to FDA at 1-800-FDA-1088. Where should I keep my medicine? Keep out of the reach of children. This medicine can be abused. Keep your medicine in a safe place to protect it from theft. Do not share this medicine with anyone. Selling or giving away this medicine is dangerous and against the law. Store at room temperature between 15 and 30 degrees C (59 and 86 degrees F). Keep container tightly closed. Throw away any unused medicine after the expiration date. Dispose of properly. This medicine may cause accidental overdose and death if it is taken by other adults, children, or pets. Mix any unused medicine with a substance like cat litter or coffee grounds. Then throw the medicine away in a sealed container like a sealed bag or a coffee can with a lid. Do not use the medicine after the expiration date. NOTE: This sheet is a summary. It may not cover all possible information. If you have questions about this medicine, talk to your doctor, pharmacist, or health care provider.  2018 Elsevier/Gold Standard (2013-11-15 18:44:41)        IF you received an x-ray today, you will receive an invoice from [facility]. Please contact [facility] at [phone] with questions or concerns regarding your invoice.   IF you received labwork today, you will receive an invoice from [facility]. Please contact [facility] at [phone] with questions or  concerns regarding your invoice.   Our billing staff will not be able to assist you with questions regarding  bills from these companies.  You will be contacted with the lab results as soon as they are available. The fastest way to get your results is to activate your My Chart account. Instructions are located on the last page of this paperwork. If you have not heard from [place] regarding the results in 2 weeks, please contact this office.

## 2017-05-19 ENCOUNTER — Encounter: Payer: Self-pay | Admitting: Endocrinology

## 2017-05-19 LAB — THYROID PANEL WITH TSH
Free Thyroxine Index: 1.8 (ref 1.2–4.9)
T3 Uptake Ratio: 27 % (ref 24–39)
T4, Total: 6.8 ug/dL (ref 4.5–12.0)
TSH: 2.62 u[IU]/mL (ref 0.450–4.500)

## 2017-05-24 ENCOUNTER — Ambulatory Visit (INDEPENDENT_AMBULATORY_CARE_PROVIDER_SITE_OTHER): Payer: Medicare Other | Admitting: Internal Medicine

## 2017-05-24 ENCOUNTER — Other Ambulatory Visit: Payer: Self-pay | Admitting: Internal Medicine

## 2017-05-24 ENCOUNTER — Encounter: Payer: Self-pay | Admitting: Internal Medicine

## 2017-05-24 VITALS — BP 140/78 | HR 72 | Temp 98.4°F | Resp 16 | Ht 62.0 in | Wt 117.2 lb

## 2017-05-24 DIAGNOSIS — F329 Major depressive disorder, single episode, unspecified: Secondary | ICD-10-CM | POA: Diagnosis not present

## 2017-05-24 DIAGNOSIS — R413 Other amnesia: Secondary | ICD-10-CM

## 2017-05-24 DIAGNOSIS — F902 Attention-deficit hyperactivity disorder, combined type: Secondary | ICD-10-CM

## 2017-05-24 DIAGNOSIS — I10 Essential (primary) hypertension: Secondary | ICD-10-CM | POA: Diagnosis not present

## 2017-05-24 DIAGNOSIS — E785 Hyperlipidemia, unspecified: Secondary | ICD-10-CM | POA: Diagnosis not present

## 2017-05-24 DIAGNOSIS — E118 Type 2 diabetes mellitus with unspecified complications: Secondary | ICD-10-CM | POA: Diagnosis not present

## 2017-05-24 DIAGNOSIS — F32A Depression, unspecified: Secondary | ICD-10-CM

## 2017-05-24 MED ORDER — BUPROPION HCL ER (XL) 150 MG PO TB24: 150.0000 mg | ORAL_TABLET | Freq: Every morning | ORAL | 1 refills | Status: DC

## 2017-05-24 MED ORDER — TRAZODONE HCL 150 MG PO TABS: 150.0000 mg | ORAL_TABLET | Freq: Every day | ORAL | 1 refills | Status: AC

## 2017-05-24 MED ORDER — SIMVASTATIN 40 MG PO TABS: 40.0000 mg | ORAL_TABLET | Freq: Every day | ORAL | 1 refills | Status: DC

## 2017-05-24 MED ORDER — BLOOD GLUCOSE MONITOR KIT: PACK | 0 refills | Status: DC

## 2017-05-24 MED ORDER — FLUOXETINE HCL 40 MG PO CAPS: 40.0000 mg | ORAL_CAPSULE | Freq: Every day | ORAL | 1 refills | Status: DC

## 2017-05-24 NOTE — Assessment & Plan Note (Signed)
She tells me that she is seeing a neurologist soon about this.

## 2017-05-24 NOTE — Patient Instructions (Signed)

## 2017-05-24 NOTE — Progress Notes (Signed)
Subjective:  Patient ID: Jill Chandler, female    DOB: Mar 07, 1945  Age: 72 y.o. MRN: [id number]  CC: Hypertension   HPI Jill Chandler presents for f/up -she returns for f/up - she saw a PA at another practice last week and she complained about the prescription for Adderall that was written about 2 months ago by another provider.  I am slowly lowering her dose due to concerns about elevated blood pressure, cognitive decline, and concerns for serotonin syndrome.  She ontinues to express her displeasure with the lower dose.  She asks for a refill today though less than 2 months ago she got 90 days worth.  She had an A1c done last week and it was down to 5.9%.  Outpatient Medications Prior to Visit  Medication Sig Dispense Refill  . amphetamine-dextroamphetamine (ADDERALL) 10 MG tablet Take 1 tablet (10 mg total) by mouth 2 (two) times daily with a meal. 180 tablet 0  . Calcium Carbonate-Vitamin D (CALCIUM + D PO) Take 1 tablet by mouth daily.     . Cyanocobalamin (B-12) 2500 MCG SUBL Place 1 tablet under the tongue 3 (three) times a week.    . ferrous sulfate 324 (65 Fe) MG TBEC Take 1 tablet by mouth 2 (two) times daily.    . fish oil-omega-3 fatty acids 1000 MG capsule Take 1 g by mouth daily.     . folic acid (FOLVITE) 1 MG tablet TAKE 1 TABLET(1 MG) BY MOUTH DAILY 90 tablet 1  . glucose blood (ONETOUCH VERIO) test strip Use to check blood sugar twice a day DX:E11.8 200 each 1  . ibandronate (BONIVA) 150 MG tablet SEE NOTES 3 tablet 0  . metroNIDAZOLE (METROGEL) 0.75 % gel Apply 1 application topically 2 (two) times daily. 45 g 5  . omeprazole (PRILOSEC) 20 MG capsule Take 1 capsule (20 mg total) by mouth daily. 90 capsule 1  . buPROPion (WELLBUTRIN XL) 150 MG 24 hr tablet Take 1 tablet (150 mg total) by mouth every morning. 90 tablet 1  . FLUoxetine (PROZAC) 40 MG capsule Take 1 capsule (40 mg total) by mouth 2 (two) times daily. (Patient taking differently: Take 80 mg by mouth daily. ) 180  capsule 1  . metFORMIN (GLUCOPHAGE-XR) 750 MG 24 hr tablet TAKE 1 TABLET BY MOUTH DAILY WITH BREAKFAST. GENERIC EQUIVALENT FOR GLUCOPHAGE-XR 90 tablet 1  . simvastatin (ZOCOR) 40 MG tablet Take 1 tablet (40 mg total) by mouth daily at 6 PM. TAKE 1 BY MOUTH AT BEDTIME 90 tablet 1  . traZODone (DESYREL) 150 MG tablet Take 1 tablet (150 mg total) by mouth at bedtime. TAKE 1 TABLET BY MOUTH AT BEDTIME AS NEEDED FOR SLEEP 90 tablet 1   No facility-administered medications prior to visit.     ROS Review of Systems  Constitutional: Negative.  Negative for appetite change, diaphoresis, fatigue and unexpected weight change.  HENT: Negative.   Eyes: Negative for visual disturbance.  Respiratory: Negative for cough, chest tightness, shortness of breath and wheezing.   Cardiovascular: Negative for chest pain, palpitations and leg swelling.  Gastrointestinal: Negative for abdominal pain, constipation, diarrhea, nausea and vomiting.  Endocrine: Negative.  Negative for polydipsia, polyphagia and polyuria.  Genitourinary: Negative.  Negative for difficulty urinating.  Musculoskeletal: Negative.  Negative for arthralgias, back pain and myalgias.  Skin: Negative.  Negative for color change.  Neurological: Negative.  Negative for dizziness, weakness, light-headedness and headaches.  Hematological: Negative for adenopathy. Does not bruise/bleed easily.  Psychiatric/Behavioral: Positive  for confusion, decreased concentration and dysphoric mood. Negative for agitation, behavioral problems, self-injury, sleep disturbance and suicidal ideas. The patient is nervous/anxious. The patient is not hyperactive.     Objective:  BP 140/78 (BP Location: Left Arm, Patient Position: Sitting, Cuff Size: Normal)   Pulse 72   Temp 98.4 F (36.9 C) (Oral)   Resp 16   Ht _0  (1.575 m)   Wt 117 lb 4 oz (53.2 kg)   SpO2 96%   BMI 21.45 kg/m   BP Readings from Last 3 Encounters:  05/24/17 140/78  05/18/17 (!) 164/80    04/13/17 136/78    Wt Readings from Last 3 Encounters:  05/24/17 117 lb 4 oz (53.2 kg)  05/18/17 117 lb 6.4 oz (53.3 kg)  04/13/17 117 lb (53.1 kg)    Physical Exam  Constitutional: She is oriented to person, place, and time. No distress.  HENT:  Mouth/Throat: Oropharynx is clear and moist. No oropharyngeal exudate.  Eyes: No scleral icterus.  Neck: Normal range of motion. Neck supple. No thyromegaly present.  Cardiovascular: Normal rate, regular rhythm and normal heart sounds. Exam reveals no gallop and no friction rub.  No murmur heard. Pulmonary/Chest: Effort normal and breath sounds normal. No stridor. No respiratory distress. She has no wheezes.  Abdominal: Soft. Bowel sounds are normal. She exhibits no distension and no mass. There is no tenderness.  Musculoskeletal: Normal range of motion. She exhibits no edema or deformity.  Lymphadenopathy:    She has no cervical adenopathy.  Neurological: She is alert and oriented to person, place, and time.  Skin: Skin is warm and dry. She is not diaphoretic.  Psychiatric: Judgment normal. Her mood appears anxious. Her speech is tangential. Her speech is not rapid and/or pressured and not delayed. She is not agitated, not aggressive and not slowed. Cognition and memory are impaired. She does not express impulsivity or inappropriate judgment. She does not exhibit a depressed mood. She expresses no homicidal and no suicidal ideation. She exhibits abnormal recent memory and abnormal remote memory.  She is anxious, rambles, and is tangential.  She has mild psychomotor hyper activity.  Vitals reviewed.   Lab Results  Component Value Date   WBC 4.5 03/21/2017   HGB 12.9 03/21/2017   HCT 38.5 03/21/2017   PLT 201 03/21/2017   GLUCOSE 151 (H) 03/26/2017   CHOL 134 09/21/2016   TRIG 128.0 09/21/2016   HDL 48.70 09/21/2016   LDLDIRECT 72.0 11/05/2015   LDLCALC 60 09/21/2016   ALT 21 03/26/2017   AST 16 03/26/2017   NA 138 03/26/2017   K  4.0 03/26/2017   CL 101 03/26/2017   CREATININE 0.64 03/26/2017   BUN 11 03/26/2017   CO2 30 03/26/2017   TSH 2.620 05/18/2017   HGBA1C 5.9 05/18/2017   MICROALBUR 1.1 03/16/2017    Dg Bone Density  Result Date: 05/02/2017 Date of study: 04/26/17 Exam: DUAL X-RAY ABSORPTIOMETRY (DXA) FOR BONE MINERAL DENSITY (BMD) Instrument: [employer] Provider: PCP Indication: follow up for low BMD Comparison: none (please note that it is not possible to compare data from different instruments) Clinical data: Pt is a 72 y.o. female without previous history of fracture. On calcium and vitamin D and fracture prevention medication Results:  Lumbar spine L1-L4 Femoral neck (FN) 33% distal radius T-score -0.6 RFN: -2.3 LFN: -1.8 n/a Change in BMD from previous DXA test (%) Down 4.0% Up 2% n/a (*) statistically significant Assessment: the BMD is low according to the  WHO classification for osteoporosis (see below). Fracture risk: moderate FRAX score: 10 year major osteoporotic risk: 12.3%. 10 year hip fracture risk: 2.9%. The thresholds for treatment are 20% and 3%, respectively.  (disregard these numbers if pt is currently on medication for osteoporosis) Comments: the technical quality of the study is good. Evaluation for secondary causes should be considered if clinically indicated. Recommend optimizing calcium (1200 mg/day) and vitamin D (800 IU/day) intake. Followup: Repeat BMD is appropriate after 2 years or after 1-2 years if starting treatment. WHO criteria for diagnosis of osteoporosis in postmenopausal women and in men 102 y/o or older: - normal: T-score -1.0 to + 1.0 - osteopenia/low bone density: T-score between -2.5 and -1.0 - osteoporosis: T-score below -2.5 - severe osteoporosis: T-score below -2.5 with history of fragility fracture Note: although not part of the WHO classification, the presence of a fragility fracture, regardless of the T-score, should be considered diagnostic of osteoporosis,  provided other causes for the fracture have been excluded. Treatment: The National Osteoporosis Foundation recommends that treatment be considered in postmenopausal women and men age 28 or older with: 1. Hip or vertebral (clinical or morphometric) fracture 2. T-score of - 2.5 or lower at the spine or hip 3. 10-year fracture probability by FRAX of at least 20% for a major osteoporotic fracture and 3% for a hip fracture [place]:   Atalie was seen today for hypertension.  Diagnoses and all orders for this visit:  Type 2 diabetes mellitus with complication, without long-term current use of insulin ([place])- Her A1c is down to 5.9% so I have asked her to stop taking metformin.  Essential hypertension- Her blood pressure is adequately well controlled.  Depression, controlled- I have informed her of my concerns and will continue to try to taper and possibly discontinue the Adderall.  Will decrease her Prozac dose from 80 a day to 40 a day.  We will continue Wellbutrin and trazodone at the current doses. -     FLUoxetine (PROZAC) 40 MG capsule; Take 1 capsule (40 mg total) by mouth daily. -     buPROPion (WELLBUTRIN XL) 150 MG 24 hr tablet; Take 1 tablet (150 mg total) by mouth every morning. -     traZODone (DESYREL) 150 MG tablet; Take 1 tablet (150 mg total) by mouth at bedtime. TAKE 1 TABLET BY MOUTH AT BEDTIME AS NEEDED FOR SLEEP  Hyperlipidemia with target LDL less than 100- She has achieved her LDL goal and is doing well on the statin. -     simvastatin (ZOCOR) 40 MG tablet; Take 1 tablet (40 mg total) by mouth daily at 6 PM. TAKE 1 BY MOUTH AT BEDTIME  Attention deficit hyperactivity disorder (ADHD), combined type -     buPROPion (WELLBUTRIN XL) 150 MG 24 hr tablet; Take 1 tablet (150 mg total) by mouth every morning.  Other orders -     blood glucose meter kit and supplies KIT; Use to test blood sugar as directed. dxE11.8   I have discontinued [name] "Brecklynn  Larios"'s metFORMIN. I have also changed her FLUoxetine. Additionally, I am having her start on blood glucose meter kit and supplies. Lastly, I am having her maintain her Calcium Carbonate-Vitamin D (CALCIUM + D PO), fish oil-omega-3 fatty acids, omeprazole, glucose blood, metroNIDAZOLE, ferrous sulfate, X-45, folic acid, amphetamine-dextroamphetamine, ibandronate, simvastatin, buPROPion, and traZODone.  Meds ordered this encounter  Medications  . FLUoxetine (PROZAC) 40 MG capsule  Sig: Take 1 capsule (40 mg total) by mouth daily.    Dispense:  90 capsule    Refill:  1  . simvastatin (ZOCOR) 40 MG tablet    Sig: Take 1 tablet (40 mg total) by mouth daily at 6 PM. TAKE 1 BY MOUTH AT BEDTIME    Dispense:  90 tablet    Refill:  1  . buPROPion (WELLBUTRIN XL) 150 MG 24 hr tablet    Sig: Take 1 tablet (150 mg total) by mouth every morning.    Dispense:  90 tablet    Refill:  1  . traZODone (DESYREL) 150 MG tablet    Sig: Take 1 tablet (150 mg total) by mouth at bedtime. TAKE 1 TABLET BY MOUTH AT BEDTIME AS NEEDED FOR SLEEP    Dispense:  90 tablet    Refill:  1  . blood glucose meter kit and supplies KIT    Sig: Use to test blood sugar as directed. dxE11.8    Dispense:  1 each    Refill:  0    Dispense One Touch Verio IQ    Order Specific Question:   Number of strips    Answer:   10    Order Specific Question:   Number of lancets    Answer:   10     Follow-up: Return in about 4 months (around 09/23/2017).  Scarlette Calico, MD

## 2017-05-25 ENCOUNTER — Ambulatory Visit: Payer: Self-pay | Admitting: Neurology

## 2017-05-25 ENCOUNTER — Encounter: Payer: Self-pay | Admitting: Internal Medicine

## 2017-05-26 ENCOUNTER — Other Ambulatory Visit: Payer: Self-pay | Admitting: Internal Medicine

## 2017-05-27 ENCOUNTER — Other Ambulatory Visit: Payer: Self-pay | Admitting: Internal Medicine

## 2017-05-27 ENCOUNTER — Other Ambulatory Visit: Payer: Self-pay

## 2017-05-27 ENCOUNTER — Encounter: Payer: Self-pay | Admitting: Internal Medicine

## 2017-05-27 DIAGNOSIS — D528 Other folate deficiency anemias: Secondary | ICD-10-CM

## 2017-05-27 DIAGNOSIS — E785 Hyperlipidemia, unspecified: Secondary | ICD-10-CM

## 2017-05-27 DIAGNOSIS — E118 Type 2 diabetes mellitus with unspecified complications: Secondary | ICD-10-CM

## 2017-05-27 MED ORDER — BLOOD GLUCOSE MONITOR KIT: PACK | 1 refills | Status: DC

## 2017-05-27 MED ORDER — BLOOD GLUCOSE MONITOR KIT: PACK | 1 refills | Status: AC

## 2017-05-27 MED ORDER — IBANDRONATE SODIUM 150 MG PO TABS: ORAL_TABLET | ORAL | 99 refills | Status: DC

## 2017-05-31 ENCOUNTER — Encounter: Payer: Self-pay | Admitting: Internal Medicine

## 2017-05-31 ENCOUNTER — Encounter: Payer: Self-pay | Admitting: Neurology

## 2017-05-31 ENCOUNTER — Ambulatory Visit: Payer: Medicare Other | Admitting: Neurology

## 2017-05-31 VITALS — BP 130/84 | HR 93 | Ht 62.0 in | Wt 117.0 lb

## 2017-05-31 DIAGNOSIS — R413 Other amnesia: Secondary | ICD-10-CM | POA: Diagnosis not present

## 2017-05-31 MED ORDER — OMEPRAZOLE 20 MG PO CPDR: 20.0000 mg | DELAYED_RELEASE_CAPSULE | Freq: Every day | ORAL | 1 refills | Status: AC

## 2017-05-31 MED ORDER — GLUCOSE BLOOD VI STRP: ORAL_STRIP | 1 refills | Status: AC

## 2017-05-31 MED ORDER — FOLIC ACID 1 MG PO TABS: 1.0000 mg | ORAL_TABLET | Freq: Every day | ORAL | 1 refills | Status: DC

## 2017-05-31 NOTE — Progress Notes (Signed)
Reason for visit: Memory disturbance  Referring physician: Dr. Royetta Crochet is a 72 y.o. female  History of present illness:  Jill Chandler is a 72 year old white female with a history of ADHD throughout her life.  The patient has been on Adderall since the 1990s, initially prescribed through psychiatry.  The patient has had a history of significant problems with depression since the 1970s, she has required hospitalizations for this.  She believes that her depression remains an issue but is under relatively good control now.  She has never had ECT treatments for depression.  The patient believes that over the last 3 years she has had some decline in her cognitive abilities.  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  She is having difficulty dealing with electronic medical records, and printers and computers.  The patient indicates that she may lose her train of thought when she is talking with somebody.  She has difficulty recognizing faces.  She has a lot of problems with organization which did not used to be a problem.  The patient has noted that with driving sometimes she gets lost.  She has some difficulty keeping up with finances, she denies issues with doing her medications and keeping up with appointments.  The patient denies any numbness or weakness of the face, arms, legs.  She has some slight imbalance issues, she denies any falls.  She indicates that her mother died with Alzheimer's disease.  The patient has fallen several times, the most recent fall was on 21 March 2017 associated with some head trauma.  The patient is undergone a CT scan of the brain that appears to be unremarkable.  She has had a vitamin B12 level that was normal.  She is sent to this office for an evaluation.  Past Medical History:  Diagnosis Date  . ADHD (attention deficit hyperactivity disorder)   . Adnexal mass   . Barrett's esophagus 03/01/2013  . 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   s/p  partial vulvectomy--- per pt no issue since  . Depression   . Folic acid deficiency   . GERD (gastroesophageal reflux disease)   . History of adenomatous polyp of colon    TUBULAR ADENOM'S  . History of cervical dysplasia   . History of ovarian cystectomy    approx. 1981  . History of squamous cell carcinoma in situ    ANAL LESIONS WITH S/P  EXCISIONAL BX 'S--- LOCALIZED--- in doctor office  . 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 Hyperlipidemia   . Iron deficiency anemia   . OA (osteoarthritis)   . Osteoporosis   . Rosacea, acne   . Type 2 diabetes mellitus ([place])   . Uterine fibroid     Past Surgical History:  Procedure Laterality Date  . ANTERIOR CERVICAL DECOMP/DISCECTOMY FUSION  11-12-2005  dr Vertell Limber   C3--4  . BREAST REDUCTION SURGERY Bilateral 11-24-2004   dr Georgia Lopes  . CATARACT EXTRACTION W/ INTRAOCULAR LENS  IMPLANT, BILATERAL  early 2000s approx.  . COLONOSCOPY  last one 02-24-2013  . ESOPHAGOGASTRODUODENOSCOPY  12/02/2015  . LAPAROSCOPIC BILATERAL SALPINGO OOPHERECTOMY Bilateral 10/06/2016   Procedure: LAPAROSCOPIC BILATERAL SALPINGO OOPHORECTOMY;  Surgeon: Paula Compton, MD;  Location: [facility];  Service: Gynecology;  Laterality: Bilateral;  . LEEP  YRS AGO   cervical dysplasia  .  OVARIAN CYST REMOVAL  1981 approx.  . TRANSTHORACIC ECHOCARDIOGRAM  04/11/2008   ef 40-81%, grade 1 diastolic dysfunction  . VULVECTOMY PARTIAL  1978 approx.    Family History  Problem Relation Age of Onset  . Breast cancer Sister 64  . Diabetes Sister   . Hypertension Sister   . Breast cancer Sister 35  . Alcohol abuse Other   . Drug abuse Other   . Hypertension Other   . Hyperlipidemia Other   . Heart disease Mother   . Osteoporosis Sister   . Cancer Neg Hx   . Early death Neg Hx   . Hearing loss Neg Hx   . Kidney disease Neg Hx   . Learning disabilities Neg Hx    . Stroke Neg Hx   . Colon cancer Neg Hx     Social history:  reports that she quit smoking about 18 years ago. Her smoking use included cigarettes. She quit after 30.00 years of use. She has never used smokeless tobacco. She reports that she drinks alcohol. She reports that she does not use drugs.  Medications:  Prior to Admission medications   Medication Sig Start Date End Date Taking? Authorizing Provider  amphetamine-dextroamphetamine (ADDERALL) 10 MG tablet Take 1 tablet (10 mg total) by mouth 2 (two) times daily with a meal. 03/26/17  Yes Marrian Salvage, FNP  blood glucose meter kit and supplies KIT Use to test blood sugar TID. dxE11.8 05/27/17  Yes Janith Lima, MD  buPROPion (WELLBUTRIN XL) 150 MG 24 hr tablet Take 1 tablet (150 mg total) by mouth every morning. 05/24/17  Yes Janith Lima, MD  Calcium Carbonate-Vitamin D (CALCIUM + D PO) Take 1 tablet by mouth daily.    Yes [provider]  Cyanocobalamin (B-12) 2500 MCG SUBL Place 1 tablet under the tongue 3 (three) times a week.   Yes [provider]  ferrous sulfate 324 (65 Fe) MG TBEC Take 1 tablet by mouth 2 (two) times daily.   Yes [provider]  fish oil-omega-3 fatty acids 1000 MG capsule Take 1 g by mouth daily.    Yes [provider]  FLUoxetine (PROZAC) 40 MG capsule Take 1 capsule (40 mg total) by mouth daily. 05/24/17  Yes Janith Lima, MD  folic acid (FOLVITE) 1 MG tablet Take 1 tablet (1 mg total) by mouth daily. 05/31/17  Yes Janith Lima, MD  glucose blood [facility] VERIO) test strip Use to check blood sugar twice a day DX:E11.8 05/31/17  Yes Janith Lima, MD  ibandronate (BONIVA) 150 MG tablet SEE NOTES 05/27/17  Yes Renato Shin, MD  metroNIDAZOLE (METROGEL) 0.75 % gel Apply 1 application topically 2 (two) times daily. 09/21/16  Yes Janith Lima, MD  omeprazole (PRILOSEC) 20 MG capsule Take 1 capsule (20 mg total) by mouth daily. 05/31/17  Yes Janith Lima, MD    simvastatin (ZOCOR) 40 MG tablet Take 1 tablet (40 mg total) by mouth daily at 6 PM. TAKE 1 BY MOUTH AT BEDTIME 05/24/17  Yes Janith Lima, MD  traZODone (DESYREL) 150 MG tablet Take 1 tablet (150 mg total) by mouth at bedtime. TAKE 1 TABLET BY MOUTH AT BEDTIME AS NEEDED FOR SLEEP 05/24/17  Yes Janith Lima, MD     No Known Allergies  ROS:  Out of a complete 14 system review of symptoms, the patient complains only of the following symptoms, and all other reviewed systems are negative.  Memory loss, confusion  Blood pressure 130/84, pulse 93, height _0  (1.575 m), weight 117 lb (53.1 kg).  Physical Exam  General: The patient is alert and cooperative at the time of the examination.  Eyes: Pupils are equal, round, and reactive to light. Discs are flat bilaterally.  Neck: The neck is supple, no carotid bruits are noted.  Respiratory: The respiratory examination is clear.  Cardiovascular: The cardiovascular examination reveals a regular rate and rhythm, no obvious murmurs or rubs are noted.  Skin: Extremities are without significant edema.  Neurologic Exam  Mental status: The patient is alert and oriented x 3 at the time of the examination. The patient has apparent normal recent and remote memory, with an apparently normal attention span and concentration ability.  Mini-Mental status examination done today shows a total score 28/30.  Cranial nerves: Facial symmetry is present. There is good sensation of the face to pinprick and soft touch bilaterally. The strength of the facial muscles and the muscles to head turning and shoulder shrug are normal bilaterally. Speech is well enunciated, no aphasia or dysarthria is noted. Extraocular movements are full. Visual fields are full. The tongue is midline, and the patient has symmetric elevation of the soft palate. No obvious hearing deficits are noted.  Motor: The motor testing reveals 5 over 5 strength of all 4 extremities. Good  symmetric motor tone is noted throughout.  Sensory: Sensory testing is intact to pinprick, soft touch, vibration sensation, and position sense on all 4 extremities. No evidence of extinction is noted.  Coordination: Cerebellar testing reveals good finger-nose-finger and heel-to-shin bilaterally.  Gait and station: Gait is normal. Tandem gait is slightly unsteady.  Romberg is positive, the patient goes backwards. No drift is seen.  Reflexes: Deep tendon reflexes are symmetric and normal bilaterally. Toes are downgoing bilaterally.   CT brain 03/21/17:  IMPRESSION: 1. No acute intracranial abnormality. 2. 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  * CT scan images were reviewed online. I agree with the written report.  Assessment/Plan:  1.  Reported memory disturbance  2.  History of depression  3.  History of ADHD  The patient has a lot of issues with learning disability previously and a history of depression.  The patient reports some change in memory over the last 3 years.  The patient will go for further blood work, she will be set up for formal neuropsychological testing.  We may consider medications for memory in the future if appropriate.  The patient has been recently reduced on the dose of her Prozac and her Adderall which the patient indicates has adversely affected her ability to focus and concentrate.  She will follow-up in 6 months.  Jill Alexanders MD 05/31/2017 3:32 PM  [facility] [address] [place], [place] [address]  Phone [phone] Fax [phone]

## 2017-05-31 NOTE — Patient Instructions (Signed)
   We will get blood work and get neuropsychological testing.

## 2017-06-01 ENCOUNTER — Telehealth: Payer: Self-pay | Admitting: Internal Medicine

## 2017-06-01 DIAGNOSIS — E118 Type 2 diabetes mellitus with unspecified complications: Secondary | ICD-10-CM | POA: Diagnosis not present

## 2017-06-01 LAB — RPR: RPR: NONREACTIVE

## 2017-06-01 LAB — SEDIMENTATION RATE: Sed Rate: 2 mm/hr (ref 0–40)

## 2017-06-01 LAB — HIV ANTIBODY (ROUTINE TESTING W REFLEX): HIV SCREEN 4TH GENERATION: NONREACTIVE

## 2017-06-01 MED ORDER — SIMVASTATIN 40 MG PO TABS: 40.0000 mg | ORAL_TABLET | Freq: Every day | ORAL | 1 refills | Status: AC

## 2017-06-01 NOTE — Telephone Encounter (Signed)
Copied from [place] [id number]. Topic: Quick Communication - Rx Refill/Question >> Jun 01, 2017  1:20 PM Oliver Pila B wrote: They have directions for taking medicine @ 6pm and at bedtime, they need for the instructions to be changed correctly so that the quantity matches the directions  Medication: simvastatin (ZOCOR) 40 MG tablet [[id number]]  Has the patient contacted their pharmacy? Yes.   (Agent: If no, request that the patient contact the pharmacy for the refill.) Preferred Pharmacy (with phone number or street name): [facility] [phone] Agent: Please be advised that RX refills may take up to 3 business days. We ask that you follow-up with your pharmacy.

## 2017-06-01 NOTE — Addendum Note (Signed)
Addended by: Aviva Signs M on: 06/01/2017 01:15 PM   Modules accepted: Orders

## 2017-06-01 NOTE — Telephone Encounter (Signed)
Please advise. Thanks.  

## 2017-06-04 ENCOUNTER — Encounter: Payer: Self-pay | Admitting: Endocrinology

## 2017-06-07 ENCOUNTER — Encounter: Payer: Self-pay | Admitting: Psychology

## 2017-06-10 ENCOUNTER — Telehealth: Payer: Self-pay

## 2017-06-10 NOTE — Telephone Encounter (Signed)
LVM requesting the patient call back to schedule a nurse visit to get prolia injection. Patient owes $260 for this and is ready to be scheduled

## 2017-06-10 NOTE — Telephone Encounter (Signed)
-----   Message from Dorna Leitz, Hailey sent at 06/04/2017  3:40 PM EDT ----- Regarding: Prolia Dr. Loanne Drilling had wanted to get this patient set up for Prolia. I don't think things were explained well to her & I am unsure if she has already been submitted to the patient portal or gotten insurance verification?

## 2017-06-14 ENCOUNTER — Telehealth: Payer: Self-pay | Admitting: Endocrinology

## 2017-06-14 NOTE — Telephone Encounter (Signed)
Patient is calling to talk to you concerning the prolia shot.

## 2017-06-14 NOTE — Telephone Encounter (Signed)
I am not sure that I can answer her questions.

## 2017-06-27 ENCOUNTER — Other Ambulatory Visit: Payer: Self-pay | Admitting: Family

## 2017-06-27 DIAGNOSIS — F900 Attention-deficit hyperactivity disorder, predominantly inattentive type: Secondary | ICD-10-CM

## 2017-06-28 ENCOUNTER — Encounter: Payer: Self-pay | Admitting: Endocrinology

## 2017-06-28 MED ORDER — AMPHETAMINE-DEXTROAMPHETAMINE 10 MG PO TABS: 10.0000 mg | ORAL_TABLET | Freq: Two times a day (BID) | ORAL | 0 refills | Status: DC

## 2017-07-01 NOTE — Telephone Encounter (Signed)
Patient will call back or send my-chart message as soon as she gets established with [facility]

## 2017-07-05 ENCOUNTER — Telehealth: Payer: Self-pay | Admitting: Endocrinology

## 2017-07-05 NOTE — Telephone Encounter (Signed)
Patient is scheduled for prolia injection on 07/20/17

## 2017-07-05 NOTE — Telephone Encounter (Signed)
Patient want to schedule a prolia injection.  called [facility] 07/03/17

## 2017-07-05 NOTE — Telephone Encounter (Signed)
Patient scheduled to get Prolia on 07/20/17

## 2017-07-20 ENCOUNTER — Ambulatory Visit (INDEPENDENT_AMBULATORY_CARE_PROVIDER_SITE_OTHER): Payer: Medicare Other

## 2017-07-20 DIAGNOSIS — D528 Other folate deficiency anemias: Secondary | ICD-10-CM | POA: Diagnosis not present

## 2017-07-20 MED ORDER — DENOSUMAB 60 MG/ML ~~LOC~~ SOSY
60.0000 mg | PREFILLED_SYRINGE | Freq: Once | SUBCUTANEOUS | Status: AC
  Administered 2017-07-20: 60 mg via SUBCUTANEOUS

## 2017-07-20 NOTE — Progress Notes (Signed)
Per orders of Dr. Loanne Drilling injection of prolia given today by Lolita Rieger . Patient tolerated injection well.

## 2017-07-27 ENCOUNTER — Other Ambulatory Visit: Payer: Self-pay | Admitting: Internal Medicine

## 2017-07-27 ENCOUNTER — Encounter: Payer: Self-pay | Admitting: Internal Medicine

## 2017-07-27 DIAGNOSIS — F329 Major depressive disorder, single episode, unspecified: Secondary | ICD-10-CM

## 2017-07-27 DIAGNOSIS — F32A Depression, unspecified: Secondary | ICD-10-CM

## 2017-07-27 DIAGNOSIS — L719 Rosacea, unspecified: Secondary | ICD-10-CM

## 2017-07-27 MED ORDER — METRONIDAZOLE 0.75 % EX GEL: 1.0000 "application " | Freq: Two times a day (BID) | CUTANEOUS | 5 refills | Status: AC

## 2017-07-27 MED ORDER — FLUOXETINE HCL 40 MG PO CAPS: 40.0000 mg | ORAL_CAPSULE | Freq: Every day | ORAL | 1 refills | Status: DC

## 2017-08-17 DIAGNOSIS — H26493 Other secondary cataract, bilateral: Secondary | ICD-10-CM | POA: Diagnosis not present

## 2017-08-17 DIAGNOSIS — H35372 Puckering of macula, left eye: Secondary | ICD-10-CM | POA: Diagnosis not present

## 2017-08-17 DIAGNOSIS — H43811 Vitreous degeneration, right eye: Secondary | ICD-10-CM | POA: Diagnosis not present

## 2017-08-19 ENCOUNTER — Other Ambulatory Visit: Payer: Self-pay | Admitting: Internal Medicine

## 2017-08-19 DIAGNOSIS — F32A Depression, unspecified: Secondary | ICD-10-CM

## 2017-08-19 DIAGNOSIS — F902 Attention-deficit hyperactivity disorder, combined type: Secondary | ICD-10-CM

## 2017-08-19 DIAGNOSIS — F329 Major depressive disorder, single episode, unspecified: Secondary | ICD-10-CM

## 2017-08-24 ENCOUNTER — Telehealth: Payer: Self-pay | Admitting: Urgent Care

## 2017-08-24 NOTE — Telephone Encounter (Signed)
Received fax from [facility] stating they left a vm for pt on 7/26 asking to call their registration dept at [phone] to register for referral.

## 2017-09-10 DIAGNOSIS — H35372 Puckering of macula, left eye: Secondary | ICD-10-CM | POA: Diagnosis not present

## 2017-09-10 DIAGNOSIS — H43811 Vitreous degeneration, right eye: Secondary | ICD-10-CM | POA: Diagnosis not present

## 2017-09-10 DIAGNOSIS — H26493 Other secondary cataract, bilateral: Secondary | ICD-10-CM | POA: Diagnosis not present

## 2017-09-10 DIAGNOSIS — H31092 Other chorioretinal scars, left eye: Secondary | ICD-10-CM | POA: Diagnosis not present

## 2017-09-13 ENCOUNTER — Other Ambulatory Visit: Payer: Self-pay | Admitting: Internal Medicine

## 2017-09-13 DIAGNOSIS — D528 Other folate deficiency anemias: Secondary | ICD-10-CM

## 2017-09-14 ENCOUNTER — Ambulatory Visit: Payer: Medicare Other | Admitting: Family Medicine

## 2017-09-15 ENCOUNTER — Encounter: Payer: Self-pay | Admitting: Internal Medicine

## 2017-09-16 ENCOUNTER — Other Ambulatory Visit: Payer: Self-pay | Admitting: Internal Medicine

## 2017-09-16 ENCOUNTER — Encounter: Payer: Self-pay | Admitting: Endocrinology

## 2017-09-16 ENCOUNTER — Encounter: Payer: Self-pay | Admitting: Internal Medicine

## 2017-09-17 ENCOUNTER — Other Ambulatory Visit: Payer: Self-pay

## 2017-09-20 DIAGNOSIS — D539 Nutritional anemia, unspecified: Secondary | ICD-10-CM | POA: Diagnosis not present

## 2017-09-20 DIAGNOSIS — R5383 Other fatigue: Secondary | ICD-10-CM | POA: Diagnosis not present

## 2017-09-20 DIAGNOSIS — E559 Vitamin D deficiency, unspecified: Secondary | ICD-10-CM | POA: Diagnosis not present

## 2017-09-20 DIAGNOSIS — E1165 Type 2 diabetes mellitus with hyperglycemia: Secondary | ICD-10-CM | POA: Diagnosis not present

## 2017-09-23 ENCOUNTER — Telehealth: Payer: Self-pay | Admitting: Internal Medicine

## 2017-09-23 NOTE — Telephone Encounter (Signed)
Patient dismissed from [facility] by Dr. Scarlette Calico, effective August 21,2019. Dismissal letter sent out by 1st class mail.[email or web address]

## 2017-09-28 DIAGNOSIS — F902 Attention-deficit hyperactivity disorder, combined type: Secondary | ICD-10-CM | POA: Diagnosis not present

## 2017-09-28 DIAGNOSIS — R030 Elevated blood-pressure reading, without diagnosis of hypertension: Secondary | ICD-10-CM | POA: Diagnosis not present

## 2017-09-28 DIAGNOSIS — E1165 Type 2 diabetes mellitus with hyperglycemia: Secondary | ICD-10-CM | POA: Diagnosis not present

## 2017-09-28 DIAGNOSIS — E559 Vitamin D deficiency, unspecified: Secondary | ICD-10-CM | POA: Diagnosis not present

## 2017-10-05 DIAGNOSIS — H5213 Myopia, bilateral: Secondary | ICD-10-CM | POA: Diagnosis not present

## 2017-10-12 DIAGNOSIS — E118 Type 2 diabetes mellitus with unspecified complications: Secondary | ICD-10-CM | POA: Diagnosis not present

## 2017-10-16 ENCOUNTER — Other Ambulatory Visit: Payer: Self-pay | Admitting: Internal Medicine

## 2017-11-04 DIAGNOSIS — E559 Vitamin D deficiency, unspecified: Secondary | ICD-10-CM | POA: Diagnosis not present

## 2017-11-04 DIAGNOSIS — E119 Type 2 diabetes mellitus without complications: Secondary | ICD-10-CM | POA: Diagnosis not present

## 2017-11-04 DIAGNOSIS — F902 Attention-deficit hyperactivity disorder, combined type: Secondary | ICD-10-CM | POA: Diagnosis not present

## 2017-11-04 DIAGNOSIS — Z23 Encounter for immunization: Secondary | ICD-10-CM | POA: Diagnosis not present

## 2017-11-15 ENCOUNTER — Ambulatory Visit: Payer: Medicare Other | Admitting: Psychology

## 2017-11-15 ENCOUNTER — Ambulatory Visit (INDEPENDENT_AMBULATORY_CARE_PROVIDER_SITE_OTHER): Payer: Medicare Other | Admitting: Psychology

## 2017-11-15 ENCOUNTER — Encounter: Payer: Self-pay | Admitting: Psychology

## 2017-11-15 DIAGNOSIS — F902 Attention-deficit hyperactivity disorder, combined type: Secondary | ICD-10-CM

## 2017-11-15 DIAGNOSIS — R413 Other amnesia: Secondary | ICD-10-CM

## 2017-11-15 DIAGNOSIS — S060X0S Concussion without loss of consciousness, sequela: Secondary | ICD-10-CM

## 2017-11-15 DIAGNOSIS — F339 Major depressive disorder, recurrent, unspecified: Secondary | ICD-10-CM

## 2017-11-15 NOTE — Progress Notes (Signed)
NEUROBEHAVIORAL STATUS EXAM   Name: Jill Chandler Date of Birth: Sep 19, 1945 Date of Interview: 11/15/2017  Reason for Referral:  Jill Chandler is a 72 y.o. female who is referred for neuropsychological evaluation by Dr. Margette Fast of [facility] due to concerns about memory loss. This patient is unaccompanied in the office for today's visit.  History of Presenting Problem:  Jill Chandler reported gradual onset of memory decline approx 3 years ago with significant worsening after head trauma in February 2019. She does have a longstanding history of ADHD and has taken Adderall since the 1990s. She notes difficulty with both hyperactivity and focus as a child at home and in school. She also has a long history of depression since late teens/early 46s. She takes Wellbutrin, Prozac, and Trazodone. In February 2019 she fell down her very steep driveway and hit her head very hard. She did not lose consciousness. She crawled to her house and into bed. She reports she had significant bleeding. Her friend convinced her to go to the ED the next day. CT head showed maxillary sinus fractures but no acute intracranial abnormality. She was seen by Dr. Jannifer Franklin for initial neurologic consultation of memory loss on 05/31/2017. MMSE was 28/30. It was noted she has a family history of Alzheimer's disease in her mother.  Cognitive changes reported by the patient include forgetfulness for recent conversations/events, frequently misplacing/losing items, making some errors with appointments/times, word finding difficulty, getting lost or confused about directions when driving. She is still working part time in her [employer] (she is a Estate manager/land agent) and has much more difficulty managing the administrative details. She cannot learn the electronic health record. It takes her significantly longer to do the administrative work (fielding and returning calls, Best boy, completing billing,  and completing documentation) and it is very confusing and overwhelming to her. She feels she is not able to problem-solve like she used to.   She lives alone and manages all instrumental ADLs independently. She has gotten some scrapes on her car. She notes poor vision related to detached/torn retina which may contribute to driving difficulty. She denies forgetting to take medications or pay bills. She sometimes makes errors in scheduling but typically does not miss appointments. She reports she gets lost when driving even in her own neighborhood on occasion, but then notes this is usually at night.  She continues to experience pain on the left side of her face and her jaw since the fall in February. She feels off balance at times. She reported a history of one other fall about 2 years ago when she fell down the steps while carrying trash out. She fell head first onto the pavement. She did not lose consciousness. She feels this fall was not as bad at the more recent one.  The patient noted that it has been a very stressful year, with the fall in February, being sick with a viral illness not long after than, and then having difficulty communicating with her PCP office about her prescriptions not long after that. She reports that medications were sent to the wrong pharmacy and she had great difficulty getting in contact with her PCP office to get the issue fixed. I see per notes in late February/early March, her Adderall dosage was reduced from 20 mg bid to 10 mg bid due to concerns about her blood pressure. Apparently Prozac dosage was also reduced, per Dr. Ronnald Ramp this was due to concerns about serotonergic syndrome. The  patient was very upset about these medication changes as she had been on the same doses for years and did not feel she was demonstrating signs of high blood pressure or serotonergic syndrome. She was also frustrated about what she felt was poor communication from her PCP office. She was  dismissed from Dr. Ronnald Ramp' practice in 08/2017. She was then unable to get any of her medication refills due to being unable to find a PCP who accepted Medicare and could see her to refill them. She did recently establish care with a new PCP and has been back on her medications for about a month. The patient reports that her mood has improved since being back on her medication. However, she continues to feel under constant stress, mostly related to being overwhelmed by the administrative tasks of her private practice. She reports sleep disturbance characterized by frequent awakening despite being on Trazodone, but she is able to get back to sleep with audio books. She denies change in appetite. She reports eating a healthy diet with good control of type 2 diabetes. She reports occasional/social alcohol use. She reports she did start smoking cigarettes again, occasionally, after the fall in February 2019 due to increased stress. She reports she still smokes occasionally. Prior to the fall she had not smoked since 2000. She denies any recreational drug use or misuse of prescription medication. She denies suicidal ideation or intention. She is not currently being treated by a mental health provider. She notes a strong family history of depression.    Social History: Born/Raised: Born in [place], [place]; moved to [place] in fifth grade. One of 5 children. Very close with her family/siblings. 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 Marital history: She has been married three times and is widowed from each marriage. Children: No children. Alcohol: Occasional/social only. Tobacco: Former everyday smoker, quit in 2000, then started smoking again (occasionally) after fall in February 2019.  SA: Denies.   Medical History: Past Medical History:  Diagnosis Date  .  ADHD (attention deficit hyperactivity disorder)   . Adnexal mass   . Barrett's esophagus 03/01/2013  . 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 Depression   . Folic acid deficiency   . GERD (gastroesophageal reflux disease)   . History of adenomatous polyp of colon    TUBULAR ADENOM'S  . History of cervical dysplasia   . History of ovarian cystectomy    approx. 1981  . History of squamous cell carcinoma in situ    ANAL LESIONS WITH S/P  EXCISIONAL BX 'S--- LOCALIZED--- in doctor office  . 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 Hyperlipidemia   . Iron deficiency anemia   . OA (osteoarthritis)   . Osteoporosis   . Rosacea, acne   . Type 2 diabetes mellitus ([place])   . Uterine fibroid      Current Medications:  Outpatient Encounter Medications as of 11/15/2017  Medication Sig  . blood glucose meter kit and supplies KIT Use to test blood sugar TID. dxE11.8  . buPROPion (WELLBUTRIN XL) 150 MG 24 hr tablet TAKE 1 TABLET BY MOUTH EVERY MORNING  . Calcium Carbonate-Vitamin D (CALCIUM + D PO) Take 1 tablet by mouth daily.   Marland Kitchen  Cyanocobalamin (B-12) 2500 MCG SUBL Place 1 tablet under the tongue 3 (three) times a week.  . ferrous sulfate 324 (65 Fe) MG TBEC Take 1 tablet by mouth 2 (two) times daily.  . fish oil-omega-3 fatty acids 1000 MG capsule Take 1 g by mouth daily.   Marland Kitchen FLUoxetine (PROZAC) 40 MG capsule Take 1 capsule (40 mg total) by mouth daily.  . folic acid (FOLVITE) 1 MG tablet Take 1 tablet (1 mg total) by mouth daily.  Marland Kitchen glucose blood (ONETOUCH VERIO) test strip Use to check blood sugar twice a day DX:E11.8  . metroNIDAZOLE (METROGEL) 0.75 % gel Apply 1 application topically 2 (two) times daily.  Marland Kitchen omeprazole (PRILOSEC) 20 MG capsule Take 1 capsule (20 mg total) by mouth daily.  . simvastatin (ZOCOR) 40 MG tablet Take 1 tablet (40 mg total) by mouth  at bedtime.  . traZODone (DESYREL) 150 MG tablet Take 1 tablet (150 mg total) by mouth at bedtime. TAKE 1 TABLET BY MOUTH AT BEDTIME AS NEEDED FOR SLEEP   No facility-administered encounter medications on file as of 11/15/2017.    Patient also reports she is back on Adderall as prescribed by her new PCP.  Behavioral Observations:   Appearance: Neatly, casually and appropriately dressed and groomed Gait: Ambulated independently, no gross abnormalities observed Speech: Fluent; somewhat disjointed at times, mild word finding difficulty.  Thought process: Generally linear Affect: Full, anxious, mildly dysthymic, appropriate to context Interpersonal: Pleasant, appropriate   60 minutes spent face-to-face with patient completing neurobehavioral status exam. 60 minutes spent integrating medical records/clinical data and completing this report. CPT [id number] unit; [id number] unit.   TESTING: There is medical necessity to proceed with neuropsychological assessment as the results will be used to aid in differential diagnosis and clinical decision-making and to inform specific treatment recommendations. Per the patient and medical records reviewed, there has been a change in cognitive functioning and a reasonable suspicion of neurocognitive disorder.  Clinical Decision Making: In considering the patient's current level of functioning, level of presumed impairment, nature of symptoms, emotional and behavioral responses during the interview, level of literacy, and observed level of motivation, a battery of tests was selected and communicated to the psychometrician.   Following the clinical interview/neurobehavioral status exam, the patient completed this full battery of neuropsychological testing with my psychometrician under my supervision (see separate note).   PLAN: The patient will return to see me for a follow-up session at which time her test performances and my impressions and treatment  recommendations will be reviewed in detail.  Evaluation ongoing; full report to follow.

## 2017-11-15 NOTE — Progress Notes (Signed)
   Neuropsychology Note  Jill Chandler completed 60 minutes of neuropsychological testing with technician, Milana Kidney, BS, under the supervision of Dr. Macarthur Critchley, Licensed Psychologist. The patient did not appear overtly distressed by the testing session, per behavioral observation or via self-report to the technician. Rest breaks were offered.   Clinical Decision Making: In considering the patient's current level of functioning, level of presumed impairment, nature of symptoms, emotional and behavioral responses during the interview, level of literacy, and observed level of motivation/effort, a battery of tests was selected and communicated to the psychometrician.  Communication between the psychologist and technician was ongoing throughout the testing session and changes were made as deemed necessary based on patient performance on testing, technician observations and additional pertinent factors such as those listed above.  Jill Chandler will return within approximately 2 weeks for an interactive feedback session with Dr. Si Raider at which time her test performances, clinical impressions and treatment recommendations will be reviewed in detail. The patient understands she can contact our office should she require our assistance before this time.  35 minutes spent performing neuropsychological evaluation services/clinical decision making (psychologist). [CPT 38377] 60 minutes spent face-to-face with patient administering standardized tests, 30 minutes spent scoring (technician). [CPT [id number], [id number]]  Full report to follow.

## 2017-11-30 DIAGNOSIS — E7800 Pure hypercholesterolemia, unspecified: Secondary | ICD-10-CM | POA: Diagnosis not present

## 2017-11-30 DIAGNOSIS — E559 Vitamin D deficiency, unspecified: Secondary | ICD-10-CM | POA: Diagnosis not present

## 2017-11-30 DIAGNOSIS — E119 Type 2 diabetes mellitus without complications: Secondary | ICD-10-CM | POA: Diagnosis not present

## 2017-11-30 DIAGNOSIS — F902 Attention-deficit hyperactivity disorder, combined type: Secondary | ICD-10-CM | POA: Diagnosis not present

## 2017-12-05 ENCOUNTER — Encounter: Payer: Self-pay | Admitting: Endocrinology

## 2017-12-06 NOTE — Telephone Encounter (Signed)
Please advise 

## 2017-12-06 NOTE — Progress Notes (Signed)
NEUROPSYCHOLOGICAL EVALUATION   Name:    Jill Chandler  Date of Birth:   05-Mar-1945 Date of Interview:  11/15/2017 Date of Testing:  11/15/2017   Date of Feedback:  12/07/2017       Background Information:  Reason for Referral:  Jill Chandler is a 72 y.o. female referred by Dr. Margette Fast of [facility] to assess her current level of cognitive functioning and assist in differential diagnosis. The current evaluation consisted of a review of available medical records, an interview with the patient, and the completion of a neuropsychological testing battery. Informed consent was obtained.  History of Presenting Problem:  Jill Chandler reported gradual onset of memory decline approx 3 years ago with significant worsening after head trauma in February 2019. She does have a longstanding history of ADHD and has taken Adderall since the 1990s. She notes difficulty with both hyperactivity and focus as a child at home and in school. She also has a long history of depression since late teens/early 77s. She takes Wellbutrin, Prozac, and Trazodone. In February 2019 she fell down her very steep driveway and hit her head very hard. She did not lose consciousness. She crawled to her house and into bed. She reports she had significant bleeding. Her friend convinced her to go to the ED the next day. CT head showed maxillary sinus fractures but no acute intracranial abnormality. She was seen by Dr. Jannifer Franklin for initial neurologic consultation of memory loss on 05/31/2017. MMSE was 28/30. It was noted she has a family history of Alzheimer's disease in her mother.  Cognitive changes reported by the patient include forgetfulness for recent conversations/events, frequently misplacing/losing items, making some errors with appointments/times, word finding difficulty, getting lost or confused about directions when driving. She is still working part time in her [employer]ical [employer] (she is a Estate manager/land agent)  and has much more difficulty managing the administrative details. She cannot learn the electronic health record. It takes her significantly longer to do the administrative work (fielding and returning calls, Best boy, completing billing, and completing documentation) and it is very confusing and overwhelming to her. She feels she is not able to problem-solve like she used to.   She lives alone and manages all instrumental ADLs independently. She has gotten some scrapes on her car. She notes poor vision related to detached/torn retina which may contribute to driving difficulty. She denies forgetting to take medications or pay bills. She sometimes makes errors in scheduling but typically does not miss appointments. She reports she gets lost when driving even in her own neighborhood on occasion, but then notes this is usually at night.  She continues to experience pain on the left side of her face and her jaw since the fall in February. She feels off balance at times. She reported a history of one other fall about 2 years ago when she fell down the steps while carrying trash out. She fell head first onto the pavement. She did not lose consciousness. She feels this fall was not as bad as the more recent one.  The patient noted that it has been a very stressful year, with the fall in February, being sick with a viral illness not long after than, and then having difficulty communicating with her PCP office about her prescriptions not long after that. She reports that medications were sent to the wrong pharmacy and she had great difficulty getting in contact with her PCP office to get the issue fixed.  I see per notes in late February/early March, her Adderall dosage was reduced from 20 mg bid to 10 mg bid due to concerns about her blood pressure. Apparently Prozac dosage was also reduced, per Dr. Ronnald Ramp this was due to concerns about serotonergic syndrome. The patient was very upset about these  medication changes as she had been on the same doses for years and did not feel she was demonstrating signs of high blood pressure or serotonergic syndrome. She was also frustrated about what she felt was poor communication from her PCP office. She was dismissed from Dr. Ronnald Ramp' practice in 08/2017. She was then unable to get any of her medication refills due to being unable to find a PCP who accepted Medicare and could see her to refill them. She did recently establish care with a new PCP and has been back on her medications for about a month. The patient reports that her mood has improved since being back on her medication. However, she continues to feel under constant stress, mostly related to being overwhelmed by the administrative tasks of her private practice. She reports sleep disturbance characterized by frequent awakening despite being on Trazodone, but she is able to get back to sleep with audio books. She denies change in appetite. She reports eating a healthy diet with good control of type 2 diabetes. She reports occasional/social alcohol use. She reports she did start smoking cigarettes again, occasionally, after the fall in February 2019 due to increased stress. She reports she still smokes occasionally. Prior to the fall she had not smoked since 2000. She denies any recreational drug use or misuse of prescription medication. She denies suicidal ideation or intention. She is not currently being treated by a mental health provider. She notes a strong family history of depression.    Social History: Born/Raised: Born in [place], [place]; moved to [place] in fifth grade. One of 5 children. Very close with her family/siblings. 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 Marital history: She has been married three times and is widowed from each  marriage. Children: No children. Alcohol: Occasional/social only. Tobacco: Former everyday smoker, quit in 2000, then started smoking again (occasionally) after fall in February 2019.  SA: Denies.   Medical History:  Past Medical History:  Diagnosis Date  . ADHD (attention deficit hyperactivity disorder)   . Adnexal mass   . Barrett's esophagus 03/01/2013  . 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 Depression   . Folic acid deficiency   . GERD (gastroesophageal reflux disease)   . History of adenomatous polyp of colon    TUBULAR ADENOM'S  . History of cervical dysplasia   . History of ovarian cystectomy    approx. 1981  . History of squamous cell carcinoma in situ    ANAL LESIONS WITH S/P  EXCISIONAL BX 'S--- LOCALIZED--- in doctor office  . 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 Hyperlipidemia   . Iron deficiency anemia   . OA (osteoarthritis)   . Osteoporosis   . Rosacea, acne   . Type 2 diabetes mellitus ([place])   . Uterine fibroid     Current medications:  Outpatient Encounter Medications as of 12/07/2017  Medication Sig  .  blood glucose meter kit and supplies KIT Use to test blood sugar TID. dxE11.8  . buPROPion (WELLBUTRIN XL) 150 MG 24 hr tablet TAKE 1 TABLET BY MOUTH EVERY MORNING  . Calcium Carbonate-Vitamin D (CALCIUM + D PO) Take 1 tablet by mouth daily.   . Cyanocobalamin (B-12) 2500 MCG SUBL Place 1 tablet under the tongue 3 (three) times a week.  . ferrous sulfate 324 (65 Fe) MG TBEC Take 1 tablet by mouth 2 (two) times daily.  . fish oil-omega-3 fatty acids 1000 MG capsule Take 1 g by mouth daily.   Marland Kitchen FLUoxetine (PROZAC) 40 MG capsule Take 1 capsule (40 mg total) by mouth daily.  . folic acid (FOLVITE) 1 MG tablet Take 1 tablet (1 mg total) by mouth daily.  Marland Kitchen glucose blood (ONETOUCH VERIO) test strip Use to check blood sugar twice  a day DX:E11.8  . metroNIDAZOLE (METROGEL) 0.75 % gel Apply 1 application topically 2 (two) times daily.  Marland Kitchen omeprazole (PRILOSEC) 20 MG capsule Take 1 capsule (20 mg total) by mouth daily.  . simvastatin (ZOCOR) 40 MG tablet Take 1 tablet (40 mg total) by mouth at bedtime.  . traZODone (DESYREL) 150 MG tablet Take 1 tablet (150 mg total) by mouth at bedtime. TAKE 1 TABLET BY MOUTH AT BEDTIME AS NEEDED FOR SLEEP   No facility-administered encounter medications on file as of 12/07/2017.    Patient also reports she is back on Adderall as prescribed by her new PCP.   Current Examination:  Behavioral Observations:  Appearance: Neatly, casually and appropriately dressed and groomed Gait: Ambulated independently, no gross abnormalities observed Speech: Fluent; somewhat disjointed at times, mild word finding difficulty.  Thought process: Generally linear Affect: Full, anxious, mildly dysthymic, appropriate to context Interpersonal: Pleasant, appropriate Orientation: Oriented to person, place and most aspects of time (disoriented to current date only). Accurately named the current President and his predecessor.   Tests Administered: . Test of Premorbid Functioning (TOPF) . Wechsler Adult Intelligence Scale-Fourth Edition (WAIS-IV): Similarities, Music therapist, Coding and Digit Span subtests . Wechsler Memory Scale-Fourth Edition (WMS-IV) Older Adult Version (ages 12-90): Logical Memory I, II and Recognition subtests  . [employer] Verbal Learning Test - 2nd Edition (CVLT-2) Short Form . Repeatable Battery for the Assessment of Neuropsychological Status (RBANS) Form A:  Figure Copy and Recall subtests and Semantic Fluency subtest . Boston Naming Test (BNT) . Boston Diagnostic Aphasia Examination: Complex Ideational Material subtest . Controlled Oral Word Association Test (COWAT) . Trail Making Test A and B . Clock drawing test . Beck Depression Inventory - 2nd Edition (BDI-II) . Generalized  Anxiety Disorder - 7 item screener (GAD-7)  Test Results: Note: Standardized scores are presented only for use by appropriately trained professionals and to allow for any future test-retest comparison. These scores should not be interpreted without consideration of all the information that is contained in the rest of the report. The most recent standardization samples from the test publisher or other sources were used whenever possible to derive standard scores; scores were corrected for age, gender, ethnicity and education when available.   Test Scores:  Test Name Raw Score Standardized Score Descriptor  TOPF 65/70 SS= 123 Superior  WAIS-IV Subtests     Similarities 22/36 ss= 9 Average  Block Design 30/66 ss= 10 Average  Coding 51/135 ss= 10 Average  Digit Span Forward 15/16 ss= 17 Very superior  Digit Span Backward 12/16 ss= 15 Superior  WMS-IV Subtests     LM I 39/53 ss= 13  High average  LM II 24/39 ss= 12 High average  LM II Recognition 23/23 Cum %: >75 Above average  RBANS Subtests     Figure Copy 16/20 Z= -1 Low average  Figure Recall 2/20 Z= -2.5 Impaired  Semantic Fluency 16 Z= -0.7 Average  CVLT-II Scores     Trial 1 5/9 Z= -0.5 Average  Trial 4 9/9 Z= 1 High average  Trials 1-4 total 30/36 T= 61 High average  SD Free Recall 8/9 Z= 0.5 Average  LD Free Recall 8/9 Z= 1 High average  LD Cued Recall 6/9 Z= -0.5 Average  Recognition Discriminability 8/9 hits 0 false positives Z= 0 Average  Forced Choice Recognition 9/9  WNL  BNT 53/60 T= 39 Low average  BDAE Complex Ideational Material 9/12  Impaired  COWAT-FAS 54 T= 60 High average  COWAT-Animals 21 T= 57 High average  Trail Making Test A  42" 0 errors T= 50 Average  Trail Making Test B  72" 0 errors T= 55 Average  Clock Drawing   Slightly impaired  BDI-II 15/63  Mild  GAD-7 14/21  Moderate      Description of Test Results:  Premorbid verbal intellectual abilities were estimated to have been within the  superior range based on a test of word reading.   Psychomotor processing speed was average.   Auditory attention and working memory were very superior to superior.   Visual-spatial construction was low average to average.   Language abilities were variable. Specifically, confrontation naming was low average, and semantic verbal fluency was average to high average. Auditory comprehension of complex ideational material was impaired.   With regard to verbal memory, encoding and acquisition of non-contextual information (i.e., word list) was high average. After a brief distracter task, free recall was average (8/9 items). After a delay, free recall was high average with 100% retention (8/9 items). Performance on a yes/no recognition task was average. On another verbal memory test, encoding and acquisition of contextual auditory information (i.e., short stories) was high average. After a delay, free recall was high average. Performance on a yes/no recognition task was above average with 100% accuracy. Meanwhile, with regard to non-verbal memory, delayed free recall of visual information was impaired.   Executive functioning was within normal limits for age overall. Mental flexibility and set-shifting were average on Trails B. Verbal fluency with phonemic search restrictions was high average. Verbal abstract reasoning was average. Performance on a clock drawing task was slightly impaired (good planning/organization, and hands pointing to correct numbers but are of incorrect hand lengths).   On a self-report measure of mood, the patient's responses were indicative of clinically significant depression at the present time. Symptoms endorsed included: mild sadness, pessimism, anhedonia, punishment feelings, loss of self confidence, restlessness, indecisiveness, loss of energy, reduced sleep, irritability, concentration difficulty and fatigue. She denied suicidal ideation or intention. On a self-report measure of  anxiety, the patient did endorsed clinically significant generalized anxiety characterized by nervousness, excessive worries, trouble relaxing, inability to control worrying, irritability and fear of something awful happening.    Clinical Impressions: Mild cognitive impairment; ADHD by history; Generalized anxiety disorder; Major Depressive Disorder, recurrent, improving with re-initiation of antidepressant medication. Results of cognitive testing are largely within normal limits for age and education. However, she did demonstrate impairment in visual memory (although verbal/auditory memory was well above average) and auditory comprehension of complex ideational material. Additionally, confrontation naming was below expectation although not technically impaired for age/education. Her neurocognitive test performances do  not warrant a diagnosis of dementia at this time, and I do not see evidence of a memory disorder such as Alzheimer's disease. 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 It is difficult to know if this represents persisting postconcussion syndrome, or prodromal stage of a neurodegenerative process. She also has a history of ADHD, which may be playing a role in reduced cognitive efficiency in combination with aging. Finally, she reports clinically significant depression and anxiety, with increased psychosocial stress in the past year, which could also be affecting cognitive functioning.    Recommendations/Plan: Based on the findings of the present evaluation, the following recommendations are offered:  1. The patient was reassured that her test results are not indicative of dementia or Alzheimer's disease at this time. However, she should continue to use compensatory and other behavioral strategies for mild cognitive impairment. A list of such strategies was reviewed.   2. Mental health treatment for depression and anxiety is highly  recommended. She is back on her antidepressant medication and medication for ADHD. The latter will have to be monitored as it can cause increased anxiety in patients with underlying anxiety disorder. Psychiatry consultation may be useful. Additionally, I think she could benefit from counseling with a psychologist/therapist if she is interested.  3. It is recommended that she continue to regularly engage in activities that provide social interaction and safe cardiovascular exercise, as these will help with mood and may enhance brain health.  4. Neurocognitive re-evaluation in 1-2 years is highly recommended in order to monitor cognitive status, track any progression of symptoms and further assist with treatment planning.   Feedback to Patient: Jill Chandler returned for a feedback appointment on 12/07/2017 to review the results of her neuropsychological evaluation with this provider. 25 minutes face-to-face time was spent reviewing her test results, my impressions and my recommendations as detailed above.    Total time spent on this patient's case: 120 minutes for neurobehavioral status exam with psychologist (CPT code [phone], [phone] unit); 90 minutes of testing/scoring by psychometrician under psychologist's supervision (CPT codes [phone], [phone] units); 180 minutes for integration of patient data, interpretation of standardized test results and clinical data, clinical decision making, treatment planning and preparation of this report, and interactive feedback with review of results to the patient/family by psychologist (CPT codes [phone], [phone] units).      Thank you for your referral of Jill Chandler. Please feel free to contact me if you have any questions or concerns regarding this report.

## 2017-12-07 ENCOUNTER — Telehealth: Payer: Self-pay | Admitting: Neurology

## 2017-12-07 ENCOUNTER — Ambulatory Visit (INDEPENDENT_AMBULATORY_CARE_PROVIDER_SITE_OTHER): Payer: Medicare Other | Admitting: Psychology

## 2017-12-07 ENCOUNTER — Encounter: Payer: Self-pay | Admitting: Psychology

## 2017-12-07 DIAGNOSIS — G3184 Mild cognitive impairment, so stated: Secondary | ICD-10-CM

## 2017-12-07 DIAGNOSIS — F902 Attention-deficit hyperactivity disorder, combined type: Secondary | ICD-10-CM

## 2017-12-07 DIAGNOSIS — S060X0S Concussion without loss of consciousness, sequela: Secondary | ICD-10-CM

## 2017-12-07 HISTORY — DX: Mild cognitive impairment of uncertain or unknown etiology: G31.84

## 2017-12-07 NOTE — Patient Instructions (Signed)
Clinical Impressions: Mild cognitive impairment; ADHD by history; Generalized anxiety disorder; Major Depressive Disorder, recurrent, improving with re-initiation of antidepressant medication. Results of cognitive testing are largely within normal limits for age and education. However, she did demonstrate impairment in visual memory (although verbal/auditory memory was well above average) and auditory comprehension of complex ideational material. Additionally, confrontation naming was below expectation although not technically impaired for age/education. Her neurocognitive test performances do not warrant a diagnosis of dementia at this time, and I do not see evidence of a memory disorder. 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 It is difficult to know if this represents persisting postconcussion syndrome, or prodromal stage of a neurodegenerative process. She also has a history of ADHD, which may be playing a role in reduced cognitive efficiency in combination with aging. Finally, she reports clinically significant depression and anxiety, with increased psychosocial stress in the past year, which could also be affecting cognitive functioning.    Recommendations/Plan: Based on the findings of the present evaluation, the following recommendations are offered:  1. The patient was reassured that her test results are not indicative of dementia or Alzheimer's disease at this time. However, she should continue to use compensatory and other behavioral strategies for mild cognitive impairment. A list of such strategies was reviewed.  2. Mental health treatment for depression and anxiety is highly recommended. She is back on her antidepressant medication and medication for ADHD. The latter will have to be monitored as it can cause increased anxiety in patients with underlying anxiety disorder. Psychiatry consultation may be useful. Additionally, I think she  could benefit from counseling with a psychologist/therapist if she is interested. 3. It is recommended that she continue to regularly engage in activities that provide social interaction and safe cardiovascular exercise, as these will help with mood and may enhance brain health. 4. Neurocognitive re-evaluation in 1-2 years is highly recommended in order to monitor cognitive status, track any progression of symptoms and further assist with treatment planning.

## 2017-12-07 NOTE — Telephone Encounter (Signed)
Neuropsychological testing done does not show clear evidence of Alzheimer's disease process.   Neuropsychological testing December 07, 2017:  Clinical Impressions: Mild cognitive impairment; ADHD by history; Generalized anxiety disorder; Major Depressive Disorder, recurrent, improving with re-initiation of antidepressant medication. Results of cognitive testing are largely within normal limits for age and education. However, she did demonstrate impairment in visual memory (although verbal/auditory memory was well above average) and auditory comprehension of complex ideational material. Additionally, confrontation naming was below expectation although not technically impaired for age/education. Her neurocognitive test performances do not warrant a diagnosis of dementia at this time, and I do not see evidence of a memory disorder such as Alzheimer's disease. 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 It is difficult to know if this represents persisting postconcussion syndrome, or prodromal stage of a neurodegenerative process. She also has a history of ADHD, which may be playing a role in reduced cognitive efficiency in combination with aging. Finally, she reports clinically significant depression and anxiety, with increased psychosocial stress in the past year, which could also be affecting cognitive functioning.    Recommendations/Plan: Based on the findings of the present evaluation, the following recommendations are offered:  1. The patient was reassured that her test results are not indicative of dementia or Alzheimer's disease at this time. However, she should continue to use compensatory and other behavioral strategies for mild cognitive impairment. A list of such strategies was reviewed.   2. Mental health treatment for depression and anxiety is highly recommended. She is back on her antidepressant medication and medication for ADHD. The  latter will have to be monitored as it can cause increased anxiety in patients with underlying anxiety disorder. Psychiatry consultation may be useful. Additionally, I think she could benefit from counseling with a psychologist/therapist if she is interested.  3. It is recommended that she continue to regularly engage in activities that provide social interaction and safe cardiovascular exercise, as these will help with mood and may enhance brain health.  4. Neurocognitive re-evaluation in 1-2 years is highly recommended in order to monitor cognitive status, track any progression of symptoms and further assist with treatment planning.   Feedback to Patient: Jill Chandler returned for a feedback appointment on 12/07/2017 to review the results of her neuropsychological evaluation with this provider. 25 minutes face-to-face time was spent reviewing her test results, my impressions and my recommendations as detailed above.

## 2017-12-14 ENCOUNTER — Ambulatory Visit: Payer: Medicare Other | Admitting: Neurology

## 2017-12-14 ENCOUNTER — Other Ambulatory Visit: Payer: Self-pay

## 2017-12-14 ENCOUNTER — Encounter: Payer: Self-pay | Admitting: Neurology

## 2017-12-14 VITALS — BP 152/78 | HR 102 | Resp 16 | Ht 62.0 in | Wt 117.0 lb

## 2017-12-14 DIAGNOSIS — G3184 Mild cognitive impairment, so stated: Secondary | ICD-10-CM | POA: Diagnosis not present

## 2017-12-14 NOTE — Progress Notes (Signed)
Reason for visit: Memory disturbance  Jill Chandler is an 72 y.o. female  History of present illness:  Jill Chandler is a 72 year old right-handed white female with a history of ADD and a prior history of depression.  The patient works as a Education officer, museum, but she has had a lot of difficulty keeping up with her job tasks.  She has too many clients, and she feels stressed out and overwhelmed by the workload.  The patient is interrupted frequently during her job tasks due to telephone calls.  The patient will lose things about the house frequently.  The patient has a lot of difficulty with directions while driving.  She recently is undergoing a neuropsychological evaluation that did not suggest Alzheimer's disease, the patient does have some mild cognitive impairment.  The patient is on Adderall taking 20 mg twice daily.  She returns to this office for an evaluation.  Past Medical History:  Diagnosis Date  . ADHD (attention deficit hyperactivity disorder)   . Adnexal mass   . Barrett's esophagus 03/01/2013  . 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 Depression   . Folic acid deficiency   . GERD (gastroesophageal reflux disease)   . History of adenomatous polyp of colon    TUBULAR ADENOM'S  . History of cervical dysplasia   . History of ovarian cystectomy    approx. 1981  . History of squamous cell carcinoma in situ    ANAL LESIONS WITH S/P  EXCISIONAL BX 'S--- LOCALIZED--- in doctor office  . 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 Hyperlipidemia   . Iron deficiency anemia   . OA (osteoarthritis)   . Osteoporosis   . Rosacea, acne   . Type 2 diabetes mellitus ([place])   . Uterine fibroid     Past Surgical History:  Procedure Laterality Date  . ANTERIOR CERVICAL DECOMP/DISCECTOMY FUSION  11-12-2005  dr Vertell Limber   C3--4  . BREAST REDUCTION SURGERY Bilateral  11-24-2004   dr Georgia Lopes  . CATARACT EXTRACTION W/ INTRAOCULAR LENS  IMPLANT, BILATERAL  early 2000s approx.  . COLONOSCOPY  last one 02-24-2013  . ESOPHAGOGASTRODUODENOSCOPY  12/02/2015  . LAPAROSCOPIC BILATERAL SALPINGO OOPHERECTOMY Bilateral 10/06/2016   Procedure: LAPAROSCOPIC BILATERAL SALPINGO OOPHORECTOMY;  Surgeon: Paula Compton, MD;  Location: [facility];  Service: Gynecology;  Laterality: Bilateral;  . LEEP  YRS AGO   cervical dysplasia  . OVARIAN CYST REMOVAL  1981 approx.  . TRANSTHORACIC ECHOCARDIOGRAM  04/11/2008   ef 32-20%, grade 1 diastolic dysfunction  . VULVECTOMY PARTIAL  1978 approx.    Family History  Problem Relation Age of Onset  . Breast cancer Sister 18  . Diabetes Sister   . Hypertension Sister   . Breast cancer Sister 85  . Alcohol abuse Other   . Drug abuse Other   . Hypertension Other   . Hyperlipidemia Other   . Heart disease Mother   . Osteoporosis Sister   . Cancer Neg Hx   . Early death Neg Hx   . Hearing loss Neg Hx   . Kidney disease Neg Hx   . Learning disabilities Neg Hx   . Stroke Neg Hx   . Colon cancer Neg Hx     Social history:  reports that she quit smoking about 19 years ago. Her  smoking use included cigarettes. She quit after 30.00 years of use. She has never used smokeless tobacco. She reports that she drinks alcohol. She reports that she does not use drugs.   No Known Allergies  Medications:  Prior to Admission medications   Medication Sig Start Date End Date Taking? Authorizing Provider  blood glucose meter kit and supplies KIT Use to test blood sugar TID. dxE11.8 05/27/17  Yes Janith Lima, MD  buPROPion (WELLBUTRIN XL) 150 MG 24 hr tablet TAKE 1 TABLET BY MOUTH EVERY MORNING 08/19/17  Yes Janith Lima, MD  Calcium Carbonate-Vitamin D (CALCIUM + D PO) Take 1 tablet by mouth daily.    Yes [provider]  Cyanocobalamin (B-12) 2500 MCG SUBL Place 1 tablet under the tongue 3 (three) times a  week.   Yes [provider]  ferrous sulfate 324 (65 Fe) MG TBEC Take 1 tablet by mouth 2 (two) times daily.   Yes [provider]  fish oil-omega-3 fatty acids 1000 MG capsule Take 1 g by mouth daily.    Yes [provider]  FLUoxetine (PROZAC) 40 MG capsule Take 1 capsule (40 mg total) by mouth daily. 07/27/17  Yes Janith Lima, MD  folic acid (FOLVITE) 1 MG tablet Take 1 tablet (1 mg total) by mouth daily. 05/31/17  Yes Janith Lima, MD  glucose blood [facility] VERIO) test strip Use to check blood sugar twice a day DX:E11.8 05/31/17  Yes Janith Lima, MD  metroNIDAZOLE (METROGEL) 0.75 % gel Apply 1 application topically 2 (two) times daily. 07/27/17  Yes Janith Lima, MD  omeprazole (PRILOSEC) 20 MG capsule Take 1 capsule (20 mg total) by mouth daily. 05/31/17  Yes Janith Lima, MD  simvastatin (ZOCOR) 40 MG tablet Take 1 tablet (40 mg total) by mouth at bedtime. 06/01/17  Yes Janith Lima, MD  traZODone (DESYREL) 150 MG tablet Take 1 tablet (150 mg total) by mouth at bedtime. TAKE 1 TABLET BY MOUTH AT BEDTIME AS NEEDED FOR SLEEP 05/24/17  Yes Janith Lima, MD    ROS:  Out of a complete 14 system review of symptoms, the patient complains only of the following symptoms, and all other reviewed systems are negative.  Memory problems  Blood pressure (!) 152/78, pulse (!) 102, resp. rate 16, height _0  (1.575 m), weight 117 lb (53.1 kg).  Physical Exam  General: The patient is alert and cooperative at the time of the examination.  Skin: No significant peripheral edema is noted.   Neurologic Exam  Mental status: The patient is alert and oriented x 3 at the time of the examination. The patient has apparent normal recent and remote memory, with an apparently normal attention span and concentration ability.  The Mini-Mental status examination done today shows a total score 29/30.  The patient is able to name 12 animals in 1 minute.   Cranial nerves:  Facial symmetry is present. Speech is normal, no aphasia or dysarthria is noted. Extraocular movements are full. Visual fields are full.  Motor: The patient has good strength in all 4 extremities.  Sensory examination: Soft touch sensation is symmetric on the face, arms, and legs.  Coordination: The patient has good finger-nose-finger and heel-to-shin bilaterally.  Gait and station: The patient has a normal gait. Tandem gait is normal. Romberg is negative. No drift is seen.  Reflexes: Deep tendon reflexes are symmetric.   Assessment/Plan:  1.  Mild cognitive impairment  2.  ADD  The patient will continue the Adderall.  She could potentially benefit from cognitive training through speech therapy, she will call me if she wishes to undergo this.  The patient may need to cut back her workload to a point where she does not feel overwhelmed.  She will follow-up through this office in 1 year.  Jill Alexanders MD 12/14/2017 2:36 PM  [facility] [address] [place], [place] [address]  Phone [phone] Fax [phone]

## 2017-12-16 IMAGING — CT CT ABD-PELV W/ CM
2 of 6 series · 14 of 46 positions shown, 18 images · IV contrast (ISOVUE 300)
Comparison: None.

CLINICAL DATA: Left upper quadrant abdominal pain for the past 3
months.

EXAM:
CT ABDOMEN AND PELVIS WITH CONTRAST
TECHNIQUE: Multidetector CT imaging of the abdomen and pelvis was performed
using the standard protocol following bolus administration of
intravenous contrast.
CONTRAST:  100mL 38OH2H-MOO IOPAMIDOL (38OH2H-MOO) INJECTION 61%

[Series 2: abd/ pelvis · axial · 0.70mm/px · z∈[-453,-63]mm · 11 of 93 slices shown, 15 images]
[im 10/93  soft-tissue]
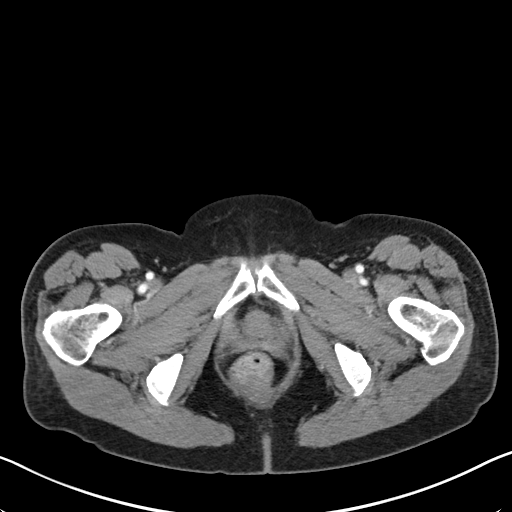
[im 10/93  bone]
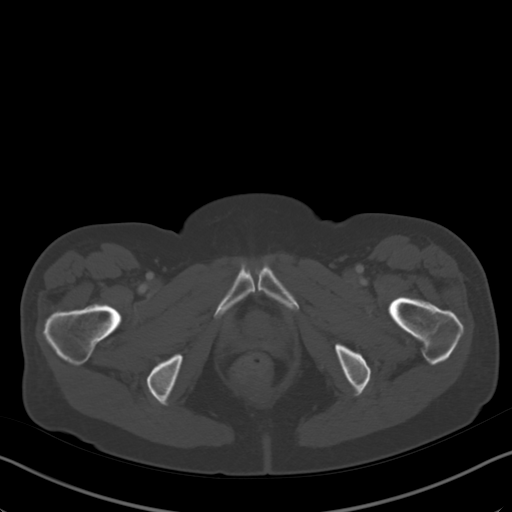
[im 20/93  soft-tissue]
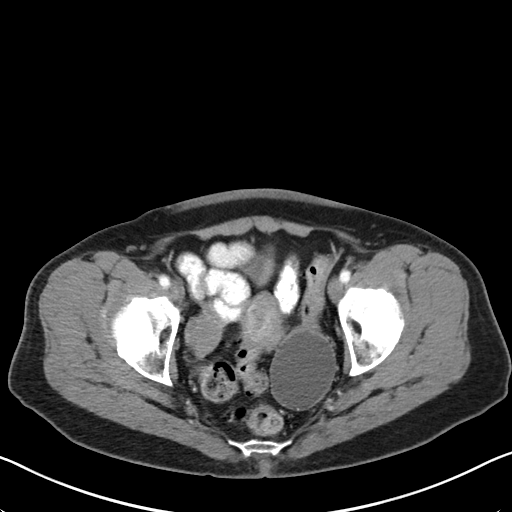
[im 30/93  soft-tissue]
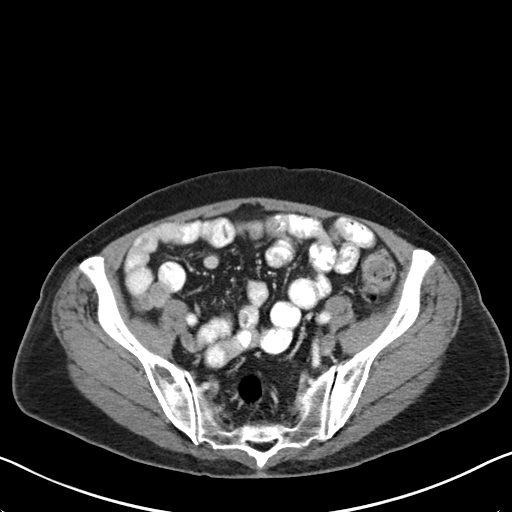
[im 39/93  soft-tissue]
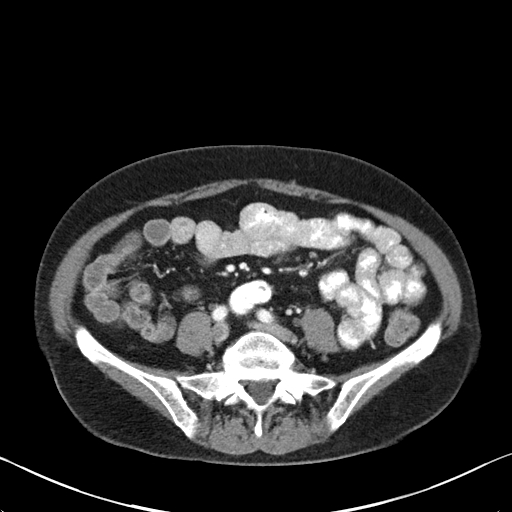
[im 49/93  soft-tissue]
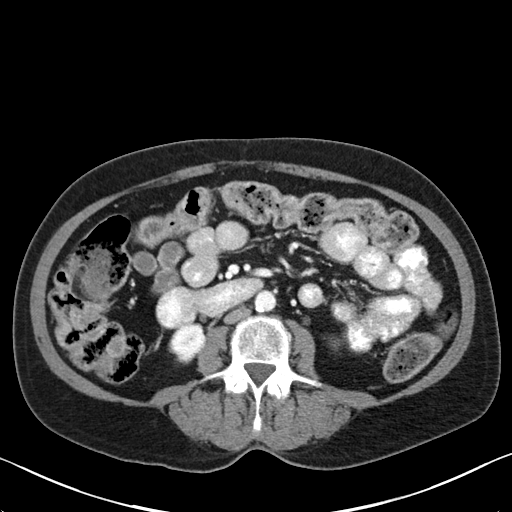
[im 59/93  soft-tissue]
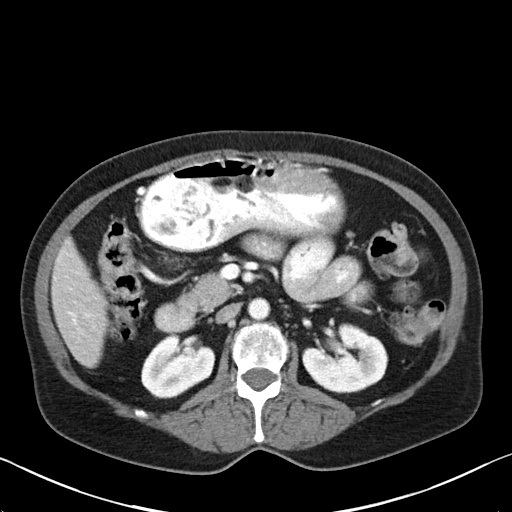
[im 68/93  soft-tissue]
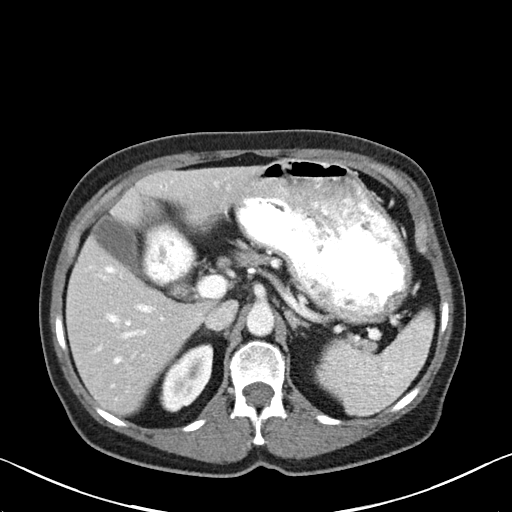
[im 73/93  lung]
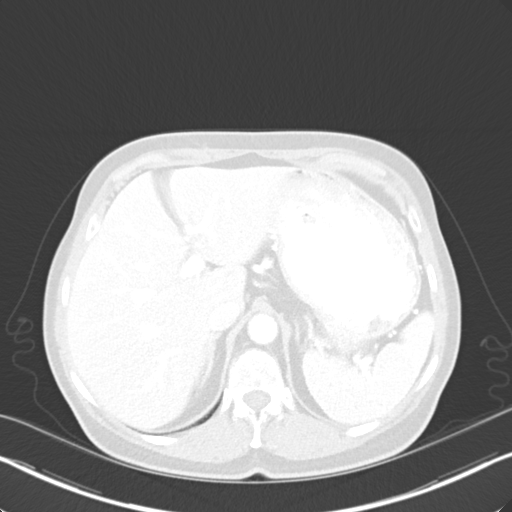
[im 78/93  soft-tissue]
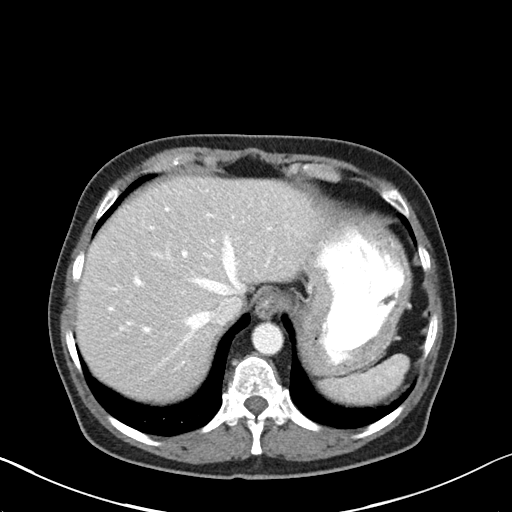
[im 78/93  lung]
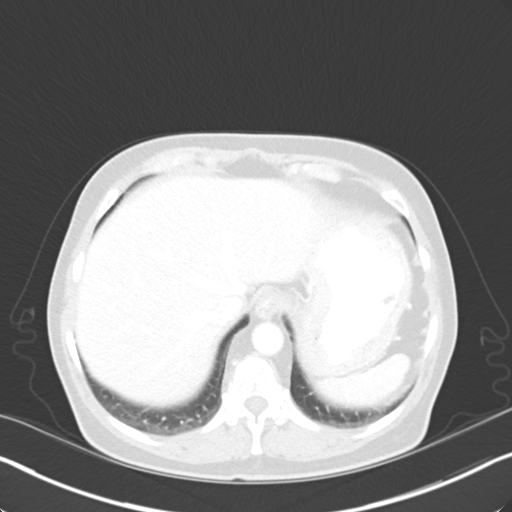
[im 83/93  lung]
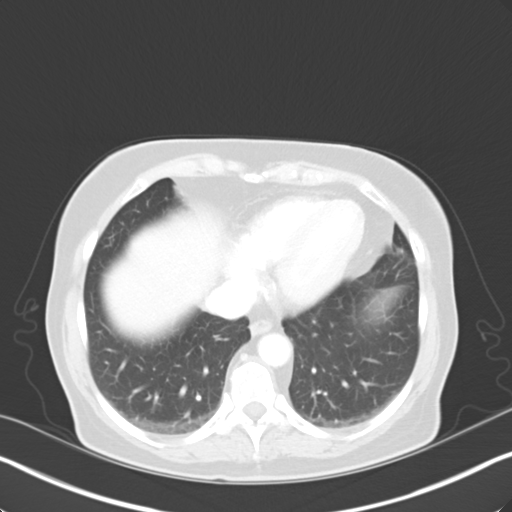
[im 88/93  soft-tissue]
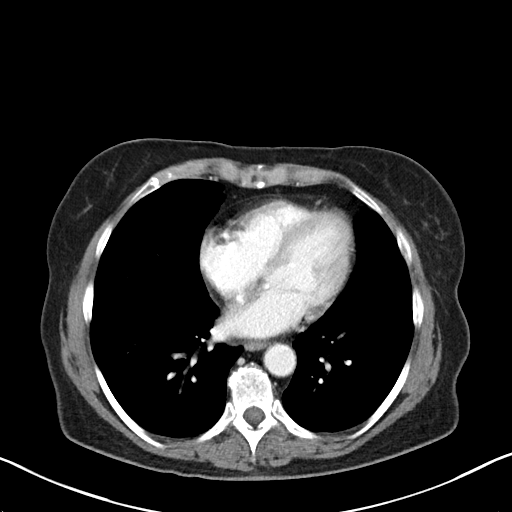
[im 88/93  lung]
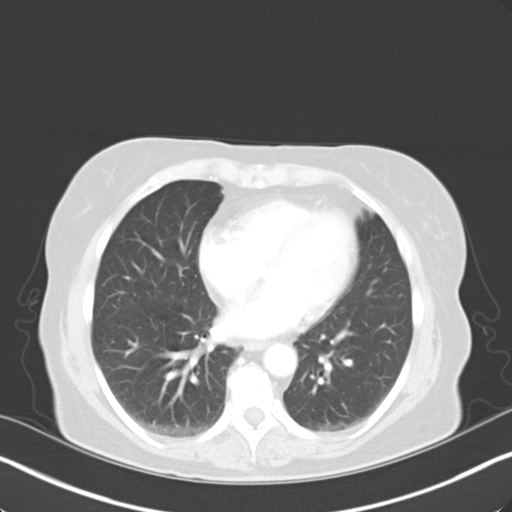
[im 88/93  bone]
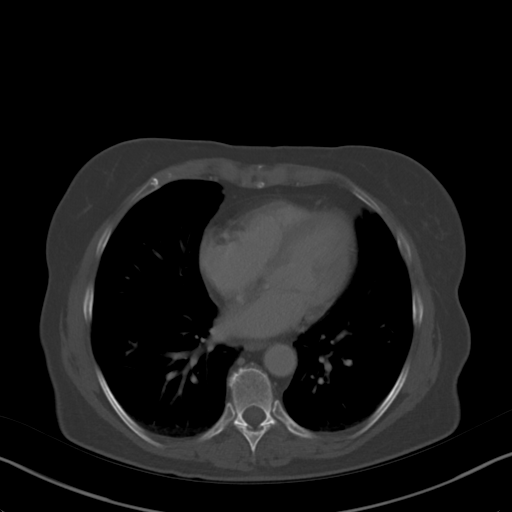

[Series 5: coronal soft tissue · coronal · 0.61mm/px · 3 of 76 slices shown]
[im 26/76  soft-tissue]
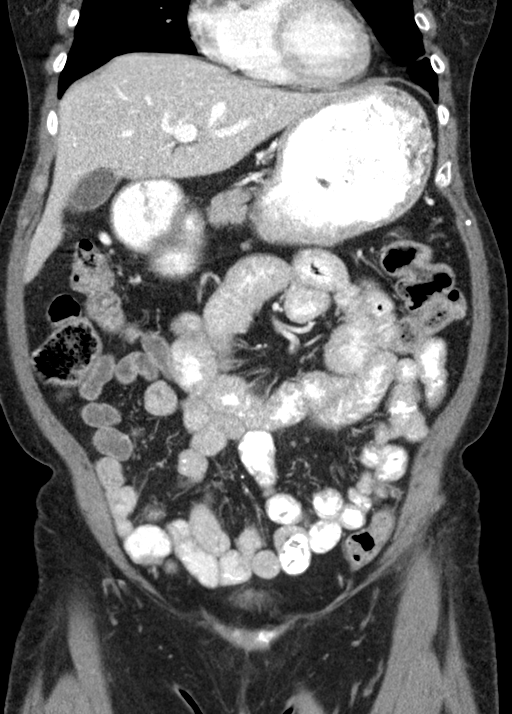
[im 34/76  soft-tissue]
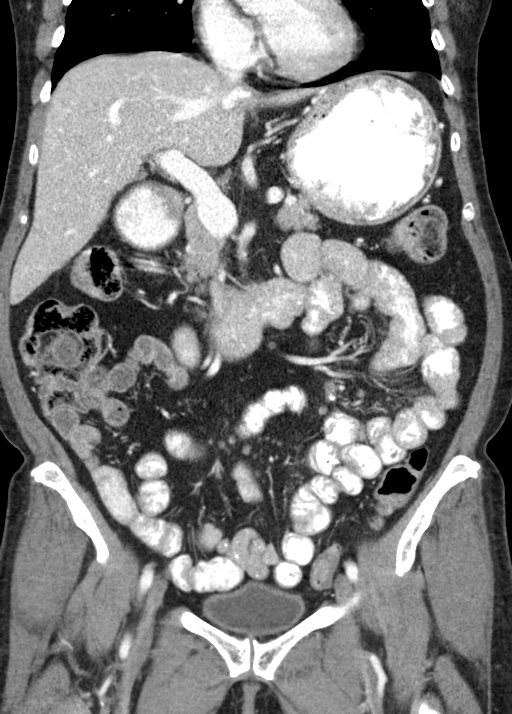
[im 42/76  soft-tissue]
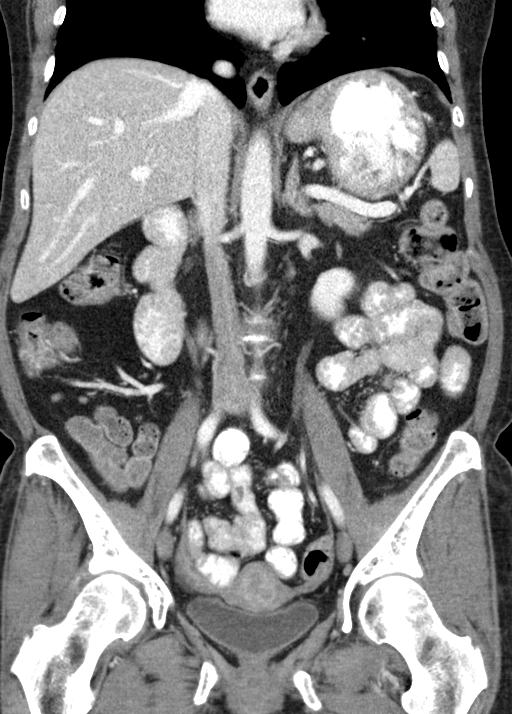

[14 of 46 positions shown; findings below may reference images not displayed]

FINDINGS: Lower chest: Minimal bilateral dependent atelectasis. No pleural
fluid.

Hepatobiliary: No focal liver abnormality is seen. No gallstones,
gallbladder wall thickening, or biliary dilatation.

Pancreas: Unremarkable. No pancreatic ductal dilatation or
surrounding inflammatory changes.

Spleen: Normal in size without focal abnormality.

Adrenals/Urinary Tract: Adrenal glands are unremarkable. Kidneys are
normal, without renal calculi, focal lesion, or hydronephrosis.
Bladder is unremarkable.

Stomach/Bowel: Mild colonic diverticulosis. No gastric or small
bowel abnormalities. Normal appearing appendix.

Vascular/Lymphatic: Mild atheromatous arterial calcifications,
including the abdominal aorta. Dense calcifications at the origin of
the left renal artery. No enlarged lymph nodes.

Reproductive: 5.5 cm left ovarian cyst. Normal appearing uterus and
right ovary.

Other: None.

Musculoskeletal: Mild lumbar and lower thoracic spine degenerative
changes. L5-S1 vacuum phenomenon.
IMPRESSION: 1. No acute abnormality.
2. 5.5 cm left ovarian cyst. Further characterization with pelvic
ultrasound is recommended.
3. Mild aortic atherosclerosis.
4. Mild colonic diverticulosis.

## 2017-12-20 ENCOUNTER — Encounter: Payer: Medicare Other | Admitting: Psychology

## 2017-12-21 DIAGNOSIS — K219 Gastro-esophageal reflux disease without esophagitis: Secondary | ICD-10-CM | POA: Diagnosis not present

## 2017-12-21 DIAGNOSIS — E785 Hyperlipidemia, unspecified: Secondary | ICD-10-CM | POA: Diagnosis not present

## 2017-12-21 DIAGNOSIS — R7989 Other specified abnormal findings of blood chemistry: Secondary | ICD-10-CM | POA: Diagnosis not present

## 2017-12-21 DIAGNOSIS — E118 Type 2 diabetes mellitus with unspecified complications: Secondary | ICD-10-CM | POA: Diagnosis not present

## 2018-01-03 ENCOUNTER — Telehealth: Payer: Self-pay

## 2018-01-03 NOTE — Telephone Encounter (Signed)
LVM requesting patient call back to schedule nurse visit for Prolia-she owes $250 and can be scheduled any time after 01/19/18

## 2018-01-05 NOTE — Telephone Encounter (Signed)
LMTCB to schedule Prolia injection

## 2018-01-07 DIAGNOSIS — Z1231 Encounter for screening mammogram for malignant neoplasm of breast: Secondary | ICD-10-CM | POA: Diagnosis not present

## 2018-01-17 ENCOUNTER — Other Ambulatory Visit: Payer: Self-pay | Admitting: Internal Medicine

## 2018-01-17 DIAGNOSIS — E785 Hyperlipidemia, unspecified: Secondary | ICD-10-CM

## 2018-01-17 DIAGNOSIS — F902 Attention-deficit hyperactivity disorder, combined type: Secondary | ICD-10-CM

## 2018-01-17 DIAGNOSIS — F329 Major depressive disorder, single episode, unspecified: Secondary | ICD-10-CM

## 2018-01-17 DIAGNOSIS — F32A Depression, unspecified: Secondary | ICD-10-CM

## 2018-01-20 ENCOUNTER — Ambulatory Visit (INDEPENDENT_AMBULATORY_CARE_PROVIDER_SITE_OTHER): Payer: Medicare Other

## 2018-01-20 DIAGNOSIS — M810 Age-related osteoporosis without current pathological fracture: Secondary | ICD-10-CM

## 2018-01-20 MED ORDER — DENOSUMAB 60 MG/ML ~~LOC~~ SOSY
60.0000 mg | PREFILLED_SYRINGE | Freq: Once | SUBCUTANEOUS | Status: AC
  Administered 2018-01-20: 60 mg via SUBCUTANEOUS

## 2018-01-20 NOTE — Progress Notes (Signed)
Per orders of Dr. Loanne Drilling injection of Prolia shot was given today by Dewitt Hoes. Patient tolerated injection well.

## 2018-03-08 DIAGNOSIS — M720 Palmar fascial fibromatosis [Dupuytren]: Secondary | ICD-10-CM | POA: Diagnosis not present

## 2018-03-08 DIAGNOSIS — L719 Rosacea, unspecified: Secondary | ICD-10-CM | POA: Diagnosis not present

## 2018-04-06 ENCOUNTER — Encounter: Payer: Self-pay | Admitting: Internal Medicine

## 2018-04-12 ENCOUNTER — Other Ambulatory Visit: Payer: Self-pay

## 2018-04-12 ENCOUNTER — Ambulatory Visit: Payer: Medicare Other | Admitting: Endocrinology

## 2018-04-12 ENCOUNTER — Encounter: Payer: Self-pay | Admitting: Endocrinology

## 2018-04-12 VITALS — BP 140/80 | HR 86 | Ht 62.0 in | Wt 120.6 lb

## 2018-04-12 DIAGNOSIS — M810 Age-related osteoporosis without current pathological fracture: Secondary | ICD-10-CM | POA: Diagnosis not present

## 2018-04-12 LAB — VITAMIN D 25 HYDROXY (VIT D DEFICIENCY, FRACTURES): VITD: 89.54 ng/mL (ref 30.00–100.00)

## 2018-04-12 MED ORDER — IBANDRONATE SODIUM 150 MG PO TABS: 150.0000 mg | ORAL_TABLET | ORAL | 3 refills | Status: DC

## 2018-04-12 NOTE — Patient Instructions (Addendum)
I have sent a prescription to your pharmacy, to resume the [place].   Let's recheck the bone density.   Blood tests are requested for you today.  We'll let you know about the results.  Please come back for a follow-up appointment in 1 year.

## 2018-04-12 NOTE — Progress Notes (Signed)
Subjective:    Patient ID: Jill Chandler, female    DOB: Dec 16, 1945, 73 y.o.   MRN: [id number]  HPI Pt returns for f/u of osteoporosis: Dx'ed: 2002 Secondary cause: none found Fractures: several facial (2019), due to a fall.   Past rx: reclast (2017) Current rx: Boniva, since 2018, and Prolia since early 2019.   Last DEXA result (2019): worst T-score was -2.3 (RFN).   Other: she takes prescription vit-D, once per week (she has taken since late 2019).   Interval hx: She stopped the boniva, because she thought she should d/c it.  No recent falls.   Past Medical History:  Diagnosis Date  . ADHD (attention deficit hyperactivity disorder)   . Adnexal mass   . Barrett's esophagus 03/01/2013  . 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 Depression   . Folic acid deficiency   . GERD (gastroesophageal reflux disease)   . History of adenomatous polyp of colon    TUBULAR ADENOM'S  . History of cervical dysplasia   . History of ovarian cystectomy    approx. 1981  . History of squamous cell carcinoma in situ    ANAL LESIONS WITH S/P  EXCISIONAL BX 'S--- LOCALIZED--- in doctor office  . 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 Hyperlipidemia   . Iron deficiency anemia   . OA (osteoarthritis)   . Osteoporosis   . Rosacea, acne   . Type 2 diabetes mellitus ([place])   . Uterine fibroid     Past Surgical History:  Procedure Laterality Date  . ANTERIOR CERVICAL DECOMP/DISCECTOMY FUSION  11-12-2005  dr Vertell Limber   C3--4  . BREAST REDUCTION SURGERY Bilateral 11-24-2004   dr Georgia Lopes  . CATARACT EXTRACTION W/ INTRAOCULAR LENS  IMPLANT, BILATERAL  early 2000s approx.  . COLONOSCOPY  last one 02-24-2013  . ESOPHAGOGASTRODUODENOSCOPY  12/02/2015  . LAPAROSCOPIC BILATERAL SALPINGO OOPHERECTOMY Bilateral 10/06/2016   Procedure: LAPAROSCOPIC BILATERAL SALPINGO OOPHORECTOMY;   Surgeon: Paula Compton, MD;  Location: [facility];  Service: Gynecology;  Laterality: Bilateral;  . LEEP  YRS AGO   cervical dysplasia  . OVARIAN CYST REMOVAL  1981 approx.  . TRANSTHORACIC ECHOCARDIOGRAM  04/11/2008   ef 88-89%, grade 1 diastolic dysfunction  . VULVECTOMY PARTIAL  1978 approx.    Social History   Socioeconomic History  . Marital status: Single    Spouse name: Not on file  . Number of children: Not on file  . Years of education: Not on file  . Highest education level: Not on file  Occupational History  . Not on file  Social Needs  . Financial resource strain: Not on file  . Food insecurity:    Worry: Not on file    Inability: Not on file  . Transportation needs:    Medical: Not on file    Non-medical: Not on file  Tobacco Use  . Smoking status: Former Smoker    Years: 30.00    Types: Cigarettes    Last attempt to quit: 09/07/1998    Years since quitting: 19.6  . Smokeless tobacco: Never Used  Substance and Sexual Activity  . Alcohol use: Yes    Comment: Occasionally  . Drug use: No  . Sexual activity: Yes    Birth control/protection: Post-menopausal  Lifestyle  . Physical activity:  Days per week: Not on file    Minutes per session: Not on file  . Stress: Not on file  Relationships  . Social connections:    Talks on phone: Not on file    Gets together: Not on file    Attends religious service: Not on file    Active member of club or organization: Not on file    Attends meetings of clubs or organizations: Not on file    Relationship status: Not on file  . Intimate partner violence:    Fear of current or ex partner: Not on file    Emotionally abused: Not on file    Physically abused: Not on file    Forced sexual activity: Not on file  Other Topics Concern  . 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 3 caffeinated beverages daily.           Current Outpatient Medications on File Prior to Visit  Medication Sig Dispense Refill  . amphetamine-dextroamphetamine (ADDERALL) 20 MG tablet Take 20 mg by mouth 2 (two) times daily.    . blood glucose meter kit and supplies KIT Use to test blood sugar TID. dxE11.8 1 each 1  . buPROPion (WELLBUTRIN XL) 150 MG 24 hr tablet TAKE 1 TABLET BY MOUTH EVERY MORNING 90 tablet 1  . Calcium Carbonate-Vitamin D (CALCIUM + D PO) Take 1 tablet by mouth daily.     . Cyanocobalamin (B-12) 2500 MCG SUBL Place 1 tablet under the tongue 3 (three) times a week.    . ferrous sulfate 324 (65 Fe) MG TBEC Take 1 tablet by mouth 2 (two) times daily.    . fish oil-omega-3 fatty acids 1000 MG capsule Take 1 g by mouth daily.     Marland Kitchen FLUoxetine (PROZAC) 40 MG capsule Take 1 capsule (40 mg total) by mouth daily. 90 capsule 1  . folic acid (FOLVITE) 1 MG tablet Take 1 tablet (1 mg total) by mouth daily. 90 tablet 1  . glucose blood (ONETOUCH VERIO) test strip Use to check blood sugar twice a day DX:E11.8 200 each 1  . metroNIDAZOLE (METROGEL) 0.75 % gel Apply 1 application topically 2 (two) times daily. 45 g 5  . omeprazole (PRILOSEC) 20 MG capsule Take 1 capsule (20 mg total) by mouth daily. 90 capsule 1  . simvastatin (ZOCOR) 40 MG tablet Take 1 tablet (40 mg total) by mouth at bedtime. 90 tablet 1  . traZODone (DESYREL) 150 MG tablet Take 1 tablet (150 mg total) by mouth at bedtime. TAKE 1 TABLET BY MOUTH AT BEDTIME AS NEEDED FOR SLEEP 90 tablet 1   No current facility-administered medications on file prior to visit.     No Known Allergies  Family History  Problem Relation Age of Onset  . Breast cancer Sister 27  . Diabetes Sister   . Hypertension Sister   . Breast cancer Sister 7  . Alcohol abuse Other   . Drug abuse Other   . Hypertension Other   . Hyperlipidemia Other   . Heart disease Mother   . Osteoporosis Sister   . Cancer Neg Hx   . Early death Neg Hx   . Hearing loss Neg Hx   . Kidney  disease Neg Hx   . Learning disabilities Neg Hx   . Stroke Neg Hx   . Colon cancer Neg Hx     BP 140/80 (BP Location:  Right Arm, Patient Position: Sitting, Cuff Size: Normal)   Pulse 86   Ht '5\' 2"'  (1.575 m)   Wt 120 lb 9.6 oz (54.7 kg)   SpO2 97%   BMI 22.06 kg/m    Review of Systems Denies heartburn    Objective:   Physical Exam VITAL SIGNS:  See vs page GENERAL: no distress Spine: no kyphosis Gait: normal and steady.   Vit-D=90    Assessment & Plan:  Osteoporosis: she should resume boniva Vit-D def: overreplaced.  Patient Instructions  I have sent a prescription to your pharmacy, to resume the [place].   Let's recheck the bone density.   Blood tests are requested for you today.  We'll let you know about the results.  Please come back for a follow-up appointment in 1 year.

## 2018-04-13 LAB — PTH, INTACT AND CALCIUM
Calcium: 9.8 mg/dL (ref 8.6–10.4)
PTH: 21 pg/mL (ref 14–64)

## 2018-04-27 DIAGNOSIS — M659 Synovitis and tenosynovitis, unspecified: Secondary | ICD-10-CM

## 2018-04-27 DIAGNOSIS — M720 Palmar fascial fibromatosis [Dupuytren]: Secondary | ICD-10-CM | POA: Insufficient documentation

## 2018-04-27 DIAGNOSIS — M79641 Pain in right hand: Secondary | ICD-10-CM | POA: Insufficient documentation

## 2018-04-27 HISTORY — DX: Synovitis and tenosynovitis, unspecified: M65.9

## 2018-05-02 ENCOUNTER — Other Ambulatory Visit: Payer: Medicare Other

## 2018-05-02 ENCOUNTER — Other Ambulatory Visit: Payer: Self-pay

## 2018-05-02 ENCOUNTER — Ambulatory Visit (INDEPENDENT_AMBULATORY_CARE_PROVIDER_SITE_OTHER)
Admission: RE | Admit: 2018-05-02 | Discharge: 2018-05-02 | Disposition: A | Discharge status: Home / Self Care | Payer: Medicare Other | Source: Ambulatory Visit | Attending: Endocrinology | Admitting: Endocrinology

## 2018-05-02 DIAGNOSIS — M810 Age-related osteoporosis without current pathological fracture: Secondary | ICD-10-CM

## 2018-05-08 DIAGNOSIS — M810 Age-related osteoporosis without current pathological fracture: Secondary | ICD-10-CM | POA: Diagnosis not present

## 2018-06-13 ENCOUNTER — Ambulatory Visit: Payer: Medicare Other | Admitting: Family Medicine

## 2018-06-13 ENCOUNTER — Ambulatory Visit: Payer: Medicare Other | Admitting: Neurology

## 2018-07-11 ENCOUNTER — Telehealth: Payer: Self-pay

## 2018-07-11 NOTE — Telephone Encounter (Signed)
Verified patient for Prolia waiting for Summary of Benefits to come back

## 2018-08-12 ENCOUNTER — Telehealth: Payer: Self-pay

## 2018-08-12 NOTE — Telephone Encounter (Signed)
LVM requesting patient call back to schedule nurse visit for Prolia-she owes $255 at check-in and is ready to be scheduled

## 2018-08-19 NOTE — Telephone Encounter (Signed)
Spoke to the patient today and she has decided to wait a month for her injection- I did explain the danger of this and she understood but wants to wait due to financial hardship-I did make patient aware that we could bill for this but she still declined nurse visit at this time-made patient aware that she needed to contact [employer] and find out if she is still eligible for the grant

## 2018-08-25 NOTE — Telephone Encounter (Signed)
Patient called and [employer] is out of funding so she stated her Prolia injection will have to wait

## 2018-09-22 NOTE — Telephone Encounter (Signed)
Pt is calling to see if the grant money is available  She has asked me to check back with her next month

## 2018-09-22 NOTE — Telephone Encounter (Signed)
Following up with the pt to see if we can get her set up for her inj  Subject to $255 copay Assistance is also an option

## 2018-11-27 ENCOUNTER — Encounter: Payer: Self-pay | Admitting: Internal Medicine

## 2018-11-28 ENCOUNTER — Encounter: Payer: Self-pay | Admitting: Internal Medicine

## 2018-11-30 NOTE — Telephone Encounter (Signed)
Called patient today and left her a VM requesting she call back to discuss Prolia-patient has been verified and 0wes $255 at check-in- I have a note stating patient would be calling to get grant from [employer] but she has not called back to ket [place] know if she will be using them

## 2019-01-10 NOTE — Telephone Encounter (Signed)
FYI

## 2019-01-10 NOTE — Telephone Encounter (Signed)
LM message again for follow up from patient regarding this prolia and if she still wants it, if she is using [employer] to pay?  Pt can be scheduled now if she wants to

## 2019-01-24 ENCOUNTER — Encounter: Payer: Self-pay | Admitting: Internal Medicine

## 2019-01-24 ENCOUNTER — Other Ambulatory Visit: Payer: Self-pay

## 2019-01-24 ENCOUNTER — Ambulatory Visit: Payer: Medicare Other | Admitting: Internal Medicine

## 2019-01-24 DIAGNOSIS — K2270 Barrett's esophagus without dysplasia: Secondary | ICD-10-CM | POA: Diagnosis not present

## 2019-01-24 DIAGNOSIS — G3184 Mild cognitive impairment, so stated: Secondary | ICD-10-CM

## 2019-01-24 NOTE — Assessment & Plan Note (Signed)
Wants to wait at least 1 year for surveillance Will send a letter in 1 year re: come then or within 1 year

## 2019-01-24 NOTE — Progress Notes (Signed)
Jill Chandler 73 y.o. Oct 01, 1945 [id number]  Assessment & Plan:   Barrett's esophagus Wants to wait at least 1 year for surveillance Will send a letter in 1 year re: come then or within 1 year  MCI (mild cognitive impairment) Return to Dr. Jannifer Chandler  XN:[id number], Jill Picket, MD    Subjective:   Chief Complaint: f/u Barrett's  HPI 73 yo ww - hx short -segment Barrett's, GERD and hiatal hernia last EGD 2017 - no dysplasia No sig heartburn, no dysphagia On daily PPI  Main issue is increasing forgetfulness. Has gotten lost at times Cannot remember where she puts things  Lives alone w/ dog [place] with family every other week  Has seen Jill Chandler in past - now seeing Iora as PCP due to insurance issues, wait time to see other PCP  Retired in March   Some reduced appetite  Wt Readings from Last 3 Encounters:  01/24/19 119 lb 2 oz (54 kg)  04/12/18 120 lb 9.6 oz (54.7 kg)  12/14/17 117 lb (53.1 kg)    No Known Allergies Current Meds  Medication Sig  . amphetamine-dextroamphetamine (ADDERALL) 20 MG tablet Take 20 mg by mouth 2 (two) times daily.  . blood glucose meter kit and supplies KIT Use to test blood sugar TID. dxE11.8  . buPROPion (WELLBUTRIN XL) 150 MG 24 hr tablet TAKE 1 TABLET BY MOUTH EVERY MORNING  . Calcium Carbonate-Vitamin D (CALCIUM + D PO) Take 1 tablet by mouth daily.   . Cyanocobalamin (B-12) 2500 MCG SUBL Place 1 tablet under the tongue 3 (three) times a week.  . ferrous sulfate 324 (65 Fe) MG TBEC Take 1 tablet by mouth daily.   . fish oil-omega-3 fatty acids 1000 MG capsule Take 1 g by mouth daily.   Marland Kitchen FLUoxetine (PROZAC) 40 MG capsule Take 1 capsule (40 mg total) by mouth daily.  Marland Kitchen glucose blood (ONETOUCH VERIO) test strip Use to check blood sugar twice a day DX:E11.8  . ibandronate (BONIVA) 150 MG tablet Take 1 tablet (150 mg total) by mouth every 30 (thirty) days. See instructions.  . metFORMIN (GLUCOPHAGE-XR) 500 MG 24 hr tablet Take 500  mg by mouth daily.  . metroNIDAZOLE (METROGEL) 0.75 % gel Apply 1 application topically 2 (two) times daily.  Marland Kitchen omeprazole (PRILOSEC) 20 MG capsule Take 1 capsule (20 mg total) by mouth daily.  . simvastatin (ZOCOR) 40 MG tablet Take 1 tablet (40 mg total) by mouth at bedtime.  . traZODone (DESYREL) 150 MG tablet Take 1 tablet (150 mg total) by mouth at bedtime. TAKE 1 TABLET BY MOUTH AT BEDTIME AS NEEDED FOR SLEEP   Past Medical History:  Diagnosis Date  . ADHD (attention deficit hyperactivity disorder)   . Barrett's esophagus 03/01/2013  . 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 Depression   . Folic acid deficiency   . GERD (gastroesophageal reflux disease)   . History of adenomatous polyp of colon    TUBULAR ADENOM'S  . History of cervical dysplasia   . History of ovarian cystectomy    approx. 1981  . History of squamous cell carcinoma in situ    ANAL LESIONS WITH S/P  EXCISIONAL BX 'S--- LOCALIZED--- in doctor office  . 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 Hyperlipidemia   . Iron deficiency anemia   .  OA (osteoarthritis)   . Osteoporosis   . Rosacea, acne   . Serous cystadenoma 2018  . Type 2 diabetes mellitus ([place])   . Uterine fibroid    Past Surgical History:  Procedure Laterality Date  . ANTERIOR CERVICAL DECOMP/DISCECTOMY FUSION  11-12-2005  dr Vertell Limber   C3--4  . BREAST REDUCTION SURGERY Bilateral 11-24-2004   dr Georgia Lopes  . CATARACT EXTRACTION W/ INTRAOCULAR LENS  IMPLANT, BILATERAL  early 2000s approx.  . COLONOSCOPY  last one 02-24-2013  . ESOPHAGOGASTRODUODENOSCOPY  12/02/2015  . LAPAROSCOPIC BILATERAL SALPINGO OOPHERECTOMY Bilateral 10/06/2016   Procedure: LAPAROSCOPIC BILATERAL SALPINGO OOPHORECTOMY;  Surgeon: Paula Compton, MD;  Location: [facility];  Service: Gynecology;  Laterality: Bilateral;  . LEEP  YRS AGO     cervical dysplasia  . OVARIAN CYST REMOVAL  1981 approx.  . TRANSTHORACIC ECHOCARDIOGRAM  04/11/2008   ef 01-22%, grade 1 diastolic dysfunction  . VULVECTOMY PARTIAL  1978 approx.   Social History   Social History Narrative   The patient is divorced, she is a Librarian, academic and addiction therapist. No children. 3 caffeinated beverages daily.         family history includes Alcohol abuse in her father; Breast cancer (age of onset: 60) in her sister and sister; Diabetes in her sister; Drug abuse in her father; Heart disease in her mother; Hyperlipidemia in her sister; Hypertension in her brother, father, and sister; Osteoporosis in her sister.   Review of Systems As per HPI Fatigue Increased thirst and urination O/w neg  Objective:   Physical Exam BP 140/74 (BP Location: Left Arm, Patient Position: Sitting, Cuff Size: Normal)   Pulse 96   Temp (!) 97.5 F (36.4 C)   Ht 5' 1.42" (1.56 m) Comment: height measured without shoes  Wt 119 lb 2 oz (54 kg)   BMI 22.20 kg/m  NAD, thine and petite ww Eyes anicteric Appropriate mood and affect appears oriented to person place and time

## 2019-01-24 NOTE — Assessment & Plan Note (Signed)
Return to Dr. Jannifer Franklin

## 2019-01-24 NOTE — Patient Instructions (Signed)
Please contact Dr Margette Fast about the memory issues you are having.   We are putting you in the system for an EGD recall date of 12/2019.    I appreciate the opportunity to care for you. Silvano Rusk, MD, [facility]

## 2019-02-06 NOTE — Telephone Encounter (Signed)
Called and left a VM requesting patient call back to let us know if she is going to stay on Prolia and if she will be using [facility]

## 2019-02-18 ENCOUNTER — Ambulatory Visit: Payer: Medicare Other | Attending: Internal Medicine

## 2019-02-18 DIAGNOSIS — Z23 Encounter for immunization: Secondary | ICD-10-CM | POA: Insufficient documentation

## 2019-02-18 NOTE — Progress Notes (Signed)
   Covid-19 Vaccination Clinic  Name:  MEGHAM EHRESMANN    MRN: [id number] DOB: Mar 03, 1945  02/18/2019  Ms. Bon was observed post Covid-19 immunization for 15 minutes without incidence. She was provided with Vaccine Information Sheet and instruction to access the V-Safe system.   Ms. Godin was instructed to call 911 with any severe reactions post vaccine: Marland Kitchen Difficulty breathing  . Swelling of your face and throat  . A fast heartbeat  . A bad rash all over your body  . Dizziness and weakness    Immunizations Administered    Name Date Dose VIS Date Route   Pfizer COVID-19 Vaccine 02/18/2019  2:50 PM 0.3 mL 01/06/2019 Intramuscular   Manufacturer: [place]   Lot: [id number]   [place]: [id number]

## 2019-03-13 ENCOUNTER — Ambulatory Visit: Payer: Medicare Other | Attending: Internal Medicine

## 2019-03-13 DIAGNOSIS — Z23 Encounter for immunization: Secondary | ICD-10-CM

## 2019-03-13 NOTE — Progress Notes (Signed)
   Covid-19 Vaccination Clinic  Name:  Jill Chandler    MRN: [id number] DOB: 1945/05/13  03/13/2019  [name] was observed post Covid-19 immunization for 15 minutes without incidence. She was provided with Vaccine Information Sheet and instruction to access the V-Safe system.   [name] was instructed to call 911 with any severe reactions post vaccine: Marland Kitchen Difficulty breathing  . Swelling of your face and throat  . A fast heartbeat  . A bad rash all over your body  . Dizziness and weakness    Immunizations Administered    Name Date Dose VIS Date Route   Pfizer COVID-19 Vaccine 03/13/2019  2:02 PM 0.3 mL 01/06/2019 Intramuscular   Manufacturer: [place]   Lot: [id number]   [place]: [id number]

## 2019-03-21 NOTE — Telephone Encounter (Signed)
Unable to leave VM today the patients mailbox was full-need to know if patient is going to be using [facility] this year for Prolia she is due for nurse visit

## 2019-04-12 ENCOUNTER — Encounter: Payer: Self-pay | Admitting: Endocrinology

## 2019-04-12 ENCOUNTER — Other Ambulatory Visit: Payer: Self-pay

## 2019-04-12 ENCOUNTER — Ambulatory Visit: Payer: Medicare Other | Admitting: Endocrinology

## 2019-04-12 ENCOUNTER — Telehealth: Payer: Self-pay

## 2019-04-12 VITALS — BP 124/80 | HR 96 | Ht 61.0 in | Wt 122.2 lb

## 2019-04-12 DIAGNOSIS — E118 Type 2 diabetes mellitus with unspecified complications: Secondary | ICD-10-CM | POA: Diagnosis not present

## 2019-04-12 DIAGNOSIS — M810 Age-related osteoporosis without current pathological fracture: Secondary | ICD-10-CM | POA: Diagnosis not present

## 2019-04-12 LAB — VITAMIN D 25 HYDROXY (VIT D DEFICIENCY, FRACTURES): VITD: 43.78 ng/mL (ref 30.00–100.00)

## 2019-04-12 LAB — TSH: TSH: 2.68 u[IU]/mL (ref 0.35–4.50)

## 2019-04-12 NOTE — Telephone Encounter (Signed)
Pt presented today for follow up appt/osteoporosis with Dr. Loanne Drilling. Per Dr. Loanne Drilling, pt would like to know about the status of her next Prolia injection. Advised I would send a message to Linus Galas, CMA for her to call the pt and to provide her with a status update.

## 2019-04-12 NOTE — Progress Notes (Signed)
Subjective:    Patient ID: Jill Chandler, female    DOB: 11/16/1945, 74 y.o.   MRN: [id number]  HPI Pt returns for f/u of osteoporosis: Dx'ed: 2002 Secondary cause: vit-D def Fractures: several facial (2019), due to a fall.   Past rx: reclast (2017) Current rx: Boniva, since 2018, and Prolia since early 2019.   Last DEXA result (2020): worst T-score was -2.3 (RFN).   Other: she took high-dose ergocalciferol 2019-2020, but not recently.   Interval hx: She takes both meds as rx'ed.  pt states she feels well in general.  Past Medical History:  Diagnosis Date  . ADHD (attention deficit hyperactivity disorder)   . Barrett's esophagus 03/01/2013  . 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 Depression   . Folic acid deficiency   . GERD (gastroesophageal reflux disease)   . History of adenomatous polyp of colon    TUBULAR ADENOM'S  . History of cervical dysplasia   . History of ovarian cystectomy    approx. 1981  . History of squamous cell carcinoma in situ    ANAL LESIONS WITH S/P  EXCISIONAL BX 'S--- LOCALIZED--- in doctor office  . 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 Hyperlipidemia   . Iron deficiency anemia   . OA (osteoarthritis)   . Osteoporosis   . Rosacea, acne   . Serous cystadenoma 2018  . Type 2 diabetes mellitus ([place])   . Uterine fibroid     Past Surgical History:  Procedure Laterality Date  . ANTERIOR CERVICAL DECOMP/DISCECTOMY FUSION  11-12-2005  dr Vertell Limber   C3--4  . BREAST REDUCTION SURGERY Bilateral 11-24-2004   dr Georgia Lopes  . CATARACT EXTRACTION W/ INTRAOCULAR LENS  IMPLANT, BILATERAL  early 2000s approx.  . COLONOSCOPY  last one 02-24-2013  . ESOPHAGOGASTRODUODENOSCOPY  12/02/2015  . LAPAROSCOPIC BILATERAL SALPINGO OOPHERECTOMY Bilateral 10/06/2016   Procedure: LAPAROSCOPIC BILATERAL SALPINGO OOPHORECTOMY;  Surgeon: Paula Compton, MD;  Location: [facility];  Service: Gynecology;  Laterality: Bilateral;  . LEEP  YRS AGO   cervical dysplasia  . OVARIAN CYST REMOVAL  1981 approx.  . TRANSTHORACIC ECHOCARDIOGRAM  04/11/2008   ef 16-01%, grade 1 diastolic dysfunction  . VULVECTOMY PARTIAL  1978 approx.    Social History   Socioeconomic History  . Marital status: Single    Spouse name: Not on file  . Number of children: 0  . Years of education: Not on file  . Highest education level: Not on file  Occupational History  . Occupation: retired Games developer  Tobacco Use  . Smoking status: Former Smoker    Years: 30.00    Types: Cigarettes    Quit date: 09/07/1998    Years since quitting: 20.6  . Smokeless tobacco: Never Used  Substance and Sexual Activity  . Alcohol use: Yes    Comment: Occasionally  . Drug use: No  . Sexual activity: Yes    Birth control/protection: Post-menopausal  Other Topics Concern  . 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 3 caffeinated beverages daily.   Retired 03/2018      Social Determinants of Radio broadcast assistant Strain:   . Difficulty of Paying Living Expenses:   Food Insecurity:   .  Worried About Charity fundraiser in the Last Year:   . Arboriculturist in the Last Year:   Transportation Needs:   . Film/video editor (Medical):   Marland Kitchen Lack of Transportation (Non-Medical):   Physical Activity:   . Days of Exercise per Week:   . Minutes of Exercise per Session:   Stress:   . Feeling of Stress :   Social Connections:   . Frequency of Communication with Friends and Family:   . Frequency of Social Gatherings with Friends and Family:   . Attends Religious Services:   . Active Member of Clubs or Organizations:   . Attends Archivist Meetings:   Marland Kitchen Marital Status:   Intimate Partner Violence:   . Fear of Current or Ex-Partner:   . Emotionally  Abused:   Marland Kitchen Physically Abused:   . Sexually Abused:     Current Outpatient Medications on File Prior to Visit  Medication Sig Dispense Refill  . amphetamine-dextroamphetamine (ADDERALL) 20 MG tablet Take 20 mg by mouth 2 (two) times daily.    . blood glucose meter kit and supplies KIT Use to test blood sugar TID. dxE11.8 1 each 1  . buPROPion (WELLBUTRIN XL) 150 MG 24 hr tablet TAKE 1 TABLET BY MOUTH EVERY MORNING 90 tablet 1  . Calcium Carbonate-Vitamin D (CALCIUM + D PO) Take 1 tablet by mouth daily.     . ferrous sulfate 324 (65 Fe) MG TBEC Take 1 tablet by mouth daily.     . fish oil-omega-3 fatty acids 1000 MG capsule Take 1 g by mouth daily.     Marland Kitchen FLUoxetine (PROZAC) 40 MG capsule Take 80 mg by mouth daily.    Marland Kitchen glucose blood (ONETOUCH VERIO) test strip Use to check blood sugar twice a day DX:E11.8 200 each 1  . ibandronate (BONIVA) 150 MG tablet Take 1 tablet (150 mg total) by mouth every 30 (thirty) days. See instructions. 3 tablet 3  . metFORMIN (GLUCOPHAGE-XR) 500 MG 24 hr tablet Take 500 mg by mouth daily.    . metroNIDAZOLE (METROGEL) 0.75 % gel Apply 1 application topically 2 (two) times daily. 45 g 5  . omeprazole (PRILOSEC) 20 MG capsule Take 1 capsule (20 mg total) by mouth daily. 90 capsule 1  . simvastatin (ZOCOR) 40 MG tablet Take 1 tablet (40 mg total) by mouth at bedtime. 90 tablet 1  . traZODone (DESYREL) 150 MG tablet Take 1 tablet (150 mg total) by mouth at bedtime. TAKE 1 TABLET BY MOUTH AT BEDTIME AS NEEDED FOR SLEEP 90 tablet 1   No current facility-administered medications on file prior to visit.    No Known Allergies  Family History  Problem Relation Age of Onset  . Breast cancer Sister 57  . Diabetes Sister   . Hypertension Sister   . Hyperlipidemia Sister   . Breast cancer Sister 63  . Heart disease Mother   . Osteoporosis Sister   . Hypertension Father   . Alcohol abuse Father   . Drug abuse Father   . Hypertension Brother   . Cancer Neg Hx   .  Early death Neg Hx   . Hearing loss Neg Hx   . Kidney disease Neg Hx   . Learning disabilities Neg Hx   . Stroke Neg Hx   . Colon cancer Neg Hx     BP 124/80   Pulse 96   Ht '5\' 1"'  (1.549 m)   Wt 122 lb 3.2 oz (  55.4 kg)   SpO2 96%   BMI 23.09 kg/m    Review of Systems denies falls.     Objective:   Physical Exam VITAL SIGNS:  See vs page GENERAL: no distress.  GAIT: normal and steady.   Lab Results  Component Value Date   CREATININE 0.64 03/26/2017   BUN 11 03/26/2017   NA 138 03/26/2017   K 4.0 03/26/2017   CL 101 03/26/2017   CO2 30 03/26/2017   25-OH vit-D=44    Assessment & Plan:  Osteoporosis, due for recheck Vit-D def: well-replaced.  Please continue the same medication.   Patient Instructions  Let's recheck the bone density.   Please continue the same medications.   Blood tests are requested for you today.  We'll let you know about the results.  Please come back for a follow-up appointment in 1 year.

## 2019-04-12 NOTE — Patient Instructions (Signed)
Let's recheck the bone density.   Please continue the same medications.   Blood tests are requested for you today.  We'll let you know about the results.  Please come back for a follow-up appointment in 1 year.

## 2019-04-13 LAB — PTH, INTACT AND CALCIUM
Calcium: 9.9 mg/dL (ref 8.6–10.4)
PTH: 34 pg/mL (ref 14–64)

## 2019-04-13 NOTE — Telephone Encounter (Signed)
routing

## 2019-04-13 NOTE — Telephone Encounter (Signed)
Pt has been submitted to the portal

## 2019-04-26 NOTE — Telephone Encounter (Signed)
SOB has come back  Pt has coverage for prolia  No PA is needed  Will be cheaper for pt to get prolia at pharmacy as there is only a 20% copayment for the administration if she does this.  LMTCB

## 2019-05-07 ENCOUNTER — Encounter: Payer: Self-pay | Admitting: Endocrinology

## 2019-05-11 ENCOUNTER — Encounter: Payer: Self-pay | Admitting: Endocrinology

## 2019-05-11 ENCOUNTER — Other Ambulatory Visit: Payer: Self-pay

## 2019-05-11 DIAGNOSIS — E118 Type 2 diabetes mellitus with unspecified complications: Secondary | ICD-10-CM

## 2019-05-11 DIAGNOSIS — M810 Age-related osteoporosis without current pathological fracture: Secondary | ICD-10-CM

## 2019-05-11 MED ORDER — IBANDRONATE SODIUM 150 MG PO TABS: 150.0000 mg | ORAL_TABLET | ORAL | 3 refills | Status: AC

## 2019-05-17 ENCOUNTER — Ambulatory Visit (INDEPENDENT_AMBULATORY_CARE_PROVIDER_SITE_OTHER)
Admission: RE | Admit: 2019-05-17 | Discharge: 2019-05-17 | Disposition: A | Discharge status: Home / Self Care | Payer: Medicare Other | Source: Ambulatory Visit | Attending: Endocrinology | Admitting: Endocrinology

## 2019-05-17 ENCOUNTER — Other Ambulatory Visit: Payer: Self-pay

## 2019-05-17 DIAGNOSIS — M810 Age-related osteoporosis without current pathological fracture: Secondary | ICD-10-CM

## 2019-05-21 DIAGNOSIS — M810 Age-related osteoporosis without current pathological fracture: Secondary | ICD-10-CM | POA: Diagnosis not present

## 2019-05-22 ENCOUNTER — Telehealth: Payer: Self-pay | Admitting: Neurology

## 2019-05-22 NOTE — Telephone Encounter (Signed)
The primary doctor called, the patient is had worsening problems with memory and gait, she was seen here almost 2 years ago for memory problems, not felt to be related to a true dementia.  The primary doctor will get an MRI of the brain, I will try to get her worked in, she has an appointment on 04 July 2019, but the primary doctor wants her seen earlier than this.

## 2019-06-07 ENCOUNTER — Other Ambulatory Visit: Payer: Self-pay | Admitting: Family Medicine

## 2019-06-07 DIAGNOSIS — R2689 Other abnormalities of gait and mobility: Secondary | ICD-10-CM

## 2019-06-07 DIAGNOSIS — F0390 Unspecified dementia without behavioral disturbance: Secondary | ICD-10-CM

## 2019-06-07 DIAGNOSIS — R413 Other amnesia: Secondary | ICD-10-CM

## 2019-06-07 DIAGNOSIS — W19XXXA Unspecified fall, initial encounter: Secondary | ICD-10-CM

## 2019-06-09 ENCOUNTER — Telehealth: Payer: Self-pay

## 2019-06-09 NOTE — Telephone Encounter (Signed)
Please refer to Dr. Ellison's response 

## 2019-06-09 NOTE — Telephone Encounter (Signed)
Result note from bone density scan completed on 05/17/2019 indicated that patient should continue all medications. Does pt need to continue Reclast infusions as well as Prolia injections? Last Reclast was in 2017.

## 2019-06-09 NOTE — Telephone Encounter (Signed)
No, it was replaced by the pill "ibandronate."

## 2019-06-09 NOTE — Telephone Encounter (Signed)
Noted. Pt will be removed from Reclast infusion list.  

## 2019-06-12 ENCOUNTER — Telehealth: Payer: Self-pay

## 2019-06-12 ENCOUNTER — Ambulatory Visit
Admission: RE | Admit: 2019-06-12 | Discharge: 2019-06-12 | Disposition: A | Discharge status: Home / Self Care | Payer: Medicare Other | Source: Ambulatory Visit | Attending: Family Medicine | Admitting: Family Medicine

## 2019-06-12 ENCOUNTER — Other Ambulatory Visit: Payer: Self-pay

## 2019-06-12 DIAGNOSIS — R413 Other amnesia: Secondary | ICD-10-CM

## 2019-06-12 DIAGNOSIS — R2689 Other abnormalities of gait and mobility: Secondary | ICD-10-CM

## 2019-06-12 DIAGNOSIS — F0390 Unspecified dementia without behavioral disturbance: Secondary | ICD-10-CM

## 2019-06-12 DIAGNOSIS — W19XXXA Unspecified fall, initial encounter: Secondary | ICD-10-CM

## 2019-06-12 MED ORDER — GADOBENATE DIMEGLUMINE 529 MG/ML IV SOLN
11.0000 mL | Freq: Once | INTRAVENOUS | Status: AC | PRN
  Administered 2019-06-12: 11 mL via INTRAVENOUS

## 2019-06-12 NOTE — Telephone Encounter (Signed)
Summary of benefits for patient has come back and indicates that patient is responsible for 20% of cost of medication as well as administration cost. Attempted to contact pt and inform her of this. Pt did not answer, and voicemail was left informing her that she owed 20%. Requested a callback to discuss further with the pt.

## 2019-06-21 NOTE — Telephone Encounter (Signed)
Called pt again and she did not answer. Left another voicemail for pt requesting a call back to inform this office whether she is interested in continuing Prolia.

## 2019-06-28 NOTE — Telephone Encounter (Signed)
Letter has been mailed.

## 2019-06-28 NOTE — Telephone Encounter (Signed)
Please send letter. Thank you!

## 2019-06-28 NOTE — Telephone Encounter (Signed)
Called pt to attempt and schedule Prolia injection. Pt did not answer, and voicemail was left requesting a call back. This is the third attempt in contacting the patient.  Please advise on how you would like to proceed.

## 2019-06-28 NOTE — Telephone Encounter (Signed)
Letter has been printed to mail to pt.

## 2019-06-29 ENCOUNTER — Encounter: Payer: Self-pay | Admitting: Endocrinology

## 2019-07-04 ENCOUNTER — Ambulatory Visit: Payer: Medicare Other | Admitting: Neurology

## 2019-07-04 ENCOUNTER — Other Ambulatory Visit: Payer: Self-pay

## 2019-07-04 ENCOUNTER — Encounter: Payer: Self-pay | Admitting: Neurology

## 2019-07-04 ENCOUNTER — Encounter: Payer: Self-pay | Admitting: Psychology

## 2019-07-04 VITALS — BP 138/67 | HR 89 | Ht 62.0 in | Wt 121.0 lb

## 2019-07-04 DIAGNOSIS — R269 Unspecified abnormalities of gait and mobility: Secondary | ICD-10-CM

## 2019-07-04 DIAGNOSIS — E538 Deficiency of other specified B group vitamins: Secondary | ICD-10-CM | POA: Diagnosis not present

## 2019-07-04 DIAGNOSIS — G3184 Mild cognitive impairment, so stated: Secondary | ICD-10-CM

## 2019-07-04 NOTE — Progress Notes (Signed)
Reason for visit: Mild cognitive impairment  Jill Chandler is a 74 y.o. female  History of present illness:  Jill Chandler is a 74 year old right-handed white female with a history mild cognitive impairment.  The patient does have a prior history of depression and ADD.  She currently is on 3 different antidepressant medications and she takes Adderall.  The patient currently is not working, she lost her job with the Covid pandemic and has never returned.  Over the last 18 months she has noted some increased problems with cognitive decline.  She is having increasing difficulty using technology such as a computer or cell phone, she has difficulty with problem-solving and difficulty with short-term memory.  She will misplace things about the house frequently.  She is still operating a motor vehicle without difficulty, she is able to manage her medications and appointments and do her finances.  She has not given up any activities of daily living because of memory.  She claims that she is sleeping fairly well, she has a good energy level during the day.  She has also noted some changes in balance, she has fallen on several occasions, the last fall was several months ago.  The patient has injured her right shoulder but has not yet seen an orthopedic surgeon for this.  The patient denies any numbness in the feet or burning or stinging in the feet.  She comes to this office for further evaluation.  Past Medical History:  Diagnosis Date  . ADHD (attention deficit hyperactivity disorder)   . Barrett's esophagus 03/01/2013  . 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 Depression   . Folic acid deficiency   . GERD (gastroesophageal reflux disease)   . History of adenomatous polyp of colon    TUBULAR ADENOM'S  . History of cervical dysplasia   . History of ovarian cystectomy    approx. 1981  . History of squamous cell carcinoma in situ    ANAL LESIONS WITH S/P   EXCISIONAL BX 'S--- LOCALIZED--- in doctor office  . 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 Hyperlipidemia   . Iron deficiency anemia   . OA (osteoarthritis)   . Osteoporosis   . Rosacea, acne   . Serous cystadenoma 2018  . Type 2 diabetes mellitus ([place])   . Uterine fibroid     Past Surgical History:  Procedure Laterality Date  . ANTERIOR CERVICAL DECOMP/DISCECTOMY FUSION  11-12-2005  dr Vertell Limber   C3--4  . BREAST REDUCTION SURGERY Bilateral 11-24-2004   dr Georgia Lopes  . CATARACT EXTRACTION W/ INTRAOCULAR LENS  IMPLANT, BILATERAL  early 2000s approx.  . COLONOSCOPY  last one 02-24-2013  . ESOPHAGOGASTRODUODENOSCOPY  12/02/2015  . LAPAROSCOPIC BILATERAL SALPINGO OOPHERECTOMY Bilateral 10/06/2016   Procedure: LAPAROSCOPIC BILATERAL SALPINGO OOPHORECTOMY;  Surgeon: Paula Compton, MD;  Location: [facility];  Service: Gynecology;  Laterality: Bilateral;  . LEEP  YRS AGO   cervical dysplasia  . OVARIAN CYST REMOVAL  1981 approx.  . TRANSTHORACIC ECHOCARDIOGRAM  04/11/2008   ef 44-62%, grade 1 diastolic dysfunction  . VULVECTOMY PARTIAL  1978 approx.    Family History  Problem Relation Age of Onset  . Breast cancer Sister 69  . Diabetes Sister   . Hypertension Sister   . Hyperlipidemia Sister   . Breast cancer  Sister 77  . Heart disease Mother   . Osteoporosis Sister   . Hypertension Father   . Alcohol abuse Father   . Drug abuse Father   . Hypertension Brother   . Cancer Neg Hx   . Early death Neg Hx   . Hearing loss Neg Hx   . Kidney disease Neg Hx   . Learning disabilities Neg Hx   . Stroke Neg Hx   . Colon cancer Neg Hx     Social history:  reports that she quit smoking about 20 years ago. Her smoking use included cigarettes. She quit after 30.00 years of use. She has never used smokeless tobacco. She reports current alcohol use. She reports that she does not use  drugs.  Medications:  Prior to Admission medications   Medication Sig Start Date End Date Taking? Authorizing Provider  amphetamine-dextroamphetamine (ADDERALL) 20 MG tablet Take 20 mg by mouth 2 (two) times daily.   Yes [provider]  blood glucose meter kit and supplies KIT Use to test blood sugar TID. dxE11.8 05/27/17  Yes Janith Lima, MD  buPROPion (WELLBUTRIN XL) 150 MG 24 hr tablet TAKE 1 TABLET BY MOUTH EVERY MORNING 08/19/17  Yes Janith Lima, MD  Calcium Carbonate-Vitamin D (CALCIUM + D PO) Take 1 tablet by mouth daily.    Yes [provider]  ferrous sulfate 324 (65 Fe) MG TBEC Take 1 tablet by mouth daily.    Yes [provider]  fish oil-omega-3 fatty acids 1000 MG capsule Take 1 g by mouth daily.    Yes [provider]  FLUoxetine (PROZAC) 40 MG capsule Take 80 mg by mouth daily.   Yes [provider]  glucose blood (ONETOUCH VERIO) test strip Use to check blood sugar twice a day DX:E11.8 05/31/17  Yes Janith Lima, MD  ibandronate (BONIVA) 150 MG tablet Take 1 tablet (150 mg total) by mouth every 30 (thirty) days. See instructions. 05/11/19  Yes Renato Shin, MD  metFORMIN (GLUCOPHAGE-XR) 500 MG 24 hr tablet Take 500 mg by mouth daily. 01/15/19  Yes [provider]  metroNIDAZOLE (METROGEL) 0.75 % gel Apply 1 application topically 2 (two) times daily. 07/27/17  Yes Janith Lima, MD  omeprazole (PRILOSEC) 20 MG capsule Take 1 capsule (20 mg total) by mouth daily. 05/31/17  Yes Janith Lima, MD  simvastatin (ZOCOR) 20 MG tablet SMARTSIG:1 Tablet(s) By Mouth Every Evening 06/21/19  Yes [provider]  simvastatin (ZOCOR) 40 MG tablet Take 1 tablet (40 mg total) by mouth at bedtime. 06/01/17  Yes Janith Lima, MD  traZODone (DESYREL) 150 MG tablet Take 1 tablet (150 mg total) by mouth at bedtime. TAKE 1 TABLET BY MOUTH AT BEDTIME AS NEEDED FOR SLEEP 05/24/17  Yes Janith Lima, MD     No Known  Allergies  ROS:  Out of a complete 14 system review of symptoms, the patient complains only of the following symptoms, and all other reviewed systems are negative.  Walking difficulty Memory problems Right shoulder pain  Blood pressure 138/67, pulse 89, height '5\' 2"'  (1.575 m), weight 121 lb (54.9 kg).  Physical Exam  General: The patient is alert and cooperative at the time of the examination.  Eyes: Pupils are equal, round, and reactive to light. Discs are flat bilaterally.  Neck: The neck is supple, no carotid bruits are noted.  Respiratory: The respiratory examination is clear.  Cardiovascular: The cardiovascular examination reveals a regular rate and rhythm,  no obvious murmurs or rubs are noted.  Skin: Extremities are without significant edema.  Neurologic Exam  Mental status: The patient is alert and oriented x 3 at the time of the examination. The Mini-Mental status examination done today shows a total score 27/30.  The patient is able to name 10 four-legged animals in 60 seconds..  Cranial nerves: Facial symmetry is present. There is good sensation of the face to pinprick and soft touch bilaterally. The strength of the facial muscles and the muscles to head turning and shoulder shrug are normal bilaterally. Speech is well enunciated, no aphasia or dysarthria is noted. Extraocular movements are full. Visual fields are full. The tongue is midline, and the patient has symmetric elevation of the soft palate. No obvious hearing deficits are noted.  Motor: The motor testing reveals 5 over 5 strength of all 4 extremities. Good symmetric motor tone is noted throughout.  Sensory: Sensory testing is intact to pinprick, soft touch, vibration sensation, and position sense on all 4 extremities, with exception of some decreased position sense in the right foot. No evidence of extinction is noted.  Coordination: Cerebellar testing reveals good finger-nose-finger and heel-to-shin  bilaterally.  Gait and station: Gait is normal. Tandem gait is slightly unsteady. Romberg is negative. No drift is seen.  Reflexes: Deep tendon reflexes are symmetric and normal bilaterally. Toes are downgoing bilaterally.   MRI brain 06/12/19:  IMPRESSION: No acute or reversible finding. Age related volume loss. Mild to moderate chronic small-vessel ischemic changes of the cerebral hemispheric white matter.  * MRI scan images were reviewed online. I agree with the written report.    Assessment/Plan:  1.  Mild cognitive impairment  2.  Depression  3.  ADD  4.  Reported gait disturbance, falls  The patient has noted some change in her cognitive abilities recently.  We will send her back for another neuropsychological testing procedure.  She has had MRI of the brain that shows evidence of mild small vessel disease and minimal atrophy.  We will check blood work today given her reports of some gait instability.  Her ability to ambulate does not appear to be significantly impaired today.  She will follow-up here in 6 months.   Jill Alexanders MD 07/04/2019 8:35 AM  [facility] [address] [place], [place] [address]  Phone [phone] Fax [phone]

## 2019-07-06 LAB — COPPER, SERUM: Copper: 97 ug/dL (ref 80–158)

## 2019-07-06 LAB — VITAMIN B12: Vitamin B-12: 1511 pg/mL — ABNORMAL HIGH (ref 232–1245)

## 2019-07-10 NOTE — Telephone Encounter (Signed)
Dr. Loanne Drilling, please refer to pt's response below on administration of Prolia and advise.  "Thank you so much for your immediate response.  Unfortunately. I will have to wait until my financial situation changes.  It's quite a lot now.  The past years the most I would be responsible for is $200 with a grant.  They are no longer offering the grant.  If any changes, I will be in contact with you.  Again, I appreciate your help.  Jill Chandler"

## 2019-07-21 DIAGNOSIS — M25511 Pain in right shoulder: Secondary | ICD-10-CM | POA: Insufficient documentation

## 2019-07-24 DIAGNOSIS — M7541 Impingement syndrome of right shoulder: Secondary | ICD-10-CM | POA: Insufficient documentation

## 2019-08-15 ENCOUNTER — Ambulatory Visit (INDEPENDENT_AMBULATORY_CARE_PROVIDER_SITE_OTHER): Payer: Medicare Other | Admitting: Psychology

## 2019-08-15 ENCOUNTER — Other Ambulatory Visit: Payer: Self-pay

## 2019-08-15 ENCOUNTER — Encounter: Payer: Self-pay | Admitting: Psychology

## 2019-08-15 ENCOUNTER — Ambulatory Visit: Payer: Medicare Other | Admitting: Psychology

## 2019-08-15 DIAGNOSIS — R41840 Attention and concentration deficit: Secondary | ICD-10-CM

## 2019-08-15 DIAGNOSIS — F411 Generalized anxiety disorder: Secondary | ICD-10-CM | POA: Diagnosis not present

## 2019-08-15 DIAGNOSIS — R4189 Other symptoms and signs involving cognitive functions and awareness: Secondary | ICD-10-CM

## 2019-08-15 DIAGNOSIS — G3184 Mild cognitive impairment, so stated: Secondary | ICD-10-CM

## 2019-08-15 NOTE — Progress Notes (Signed)
   Psychometrician Note   Cognitive testing was administered to Jill Chandler by Milana Kidney, B.S. (psychometrist) under the supervision of Dr. Christia Reading, Ph.D., licensed psychologist on 08/15/19. Jill Chandler did not appear overtly distressed by the testing session per behavioral observation or responses across self-report questionnaires. Dr. Christia Reading, Ph.D. checked in with Jill Chandler as needed to manage any distress related to testing procedures (if applicable). Rest breaks were offered.    The battery of tests administered was selected by Dr. Christia Reading, Ph.D. with consideration to Jill Chandler current level of functioning, the nature of her symptoms, emotional and behavioral responses during interview, level of literacy, observed level of motivation/effort, and the nature of the referral question. This battery was communicated to the psychometrist. Communication between Dr. Christia Reading, Ph.D. and the psychometrist was ongoing throughout the evaluation and Dr. Christia Reading, Ph.D. was immediately accessible at all times. Dr. Christia Reading, Ph.D. provided supervision to the psychometrist on the date of this service to the extent necessary to assure the quality of all services provided.    Jill Chandler will return within approximately 1-2 weeks for an interactive feedback session with Dr. Melvyn Novas at which time her test performances, clinical impressions, and treatment recommendations will be reviewed in detail. Jill Chandler understands she can contact our office should she require our assistance before this time.  A total of 110 minutes of billable time were spent face-to-face with Jill Chandler by the psychometrist. This includes both test administration and scoring time. Billing for these services is reflected in the clinical report generated by Dr. Christia Reading, Ph.D..  This note reflects time spent with the psychometrician and does not include test scores or any clinical interpretations  made by Dr. Melvyn Novas. The full report will follow in a separate note.

## 2019-08-15 NOTE — Progress Notes (Signed)
NEUROPSYCHOLOGICAL EVALUATION [place] Department of Neurology  Date of Evaluation: August 15, 2019  Reason for Referral:   Jill Chandler is a 74 y.o. right-handed Caucasian female referred by Margette Fast, M.D., to characterize her current cognitive functioning and assist with diagnostic clarity and treatment planning in the context of subjective cognitive decline.   Assessment and Plan:   Clinical Impression(s): Jill Chandler pattern of performance is suggestive of primary weaknesses in processing speed, semantic fluency, and retrieval aspects of visual memory. Further performance variability was noted across cognitive flexibility. Performance was largely appropriate across domains of attention/concentration, receptive language, phonemic fluency, confrontation naming, visuospatial abilities, and verbal learning and memory. Jill Chandler denied difficulties completing instrumental activities of daily living (ADLs) independently.  Jill Chandler did fatigue as the evaluation progressed and ultimately discontinued the evaluation prematurely. As such, not all tasks from the previous evaluation were able to be replicated. However, relative to her previous evaluation, many of her scores exhibited stability, including weaknesses across the retrieval of previously learned visual information. Performance declines were seen across domains of processing speed, cognitive flexibility, and semantic fluency. Improvements were seen across confrontation naming.  Regarding etiology, Jill Chandler reported severe levels of acute anxiety across a related questionnaire. She also reported generally feeling overwhelmed and experiencing significant stress in her daily life. It is quite likely that the increases in psychiatric and psychosocial distress exacerbate underlying attentional dysregulation given her history of ADHD. She also reported significant symptoms of fatigue which did appear to impact the  current evaluation. All these factors could explain performance declines, as well as current weaknesses and/or variability across processing speed, executive functioning, and semantic fluency. They could also explain subjective difficulties with verbal memory. Neuroimaging suggesting mild to moderate small vessel ischemic changes could also suggest a vascular contribution to her presentation which would affect similar cognitive domains. While I cannot rule out an underlying neurodegenerative condition, current results do not mimic patterns for more common illnesses such as Alzheimer's disease or Lewy body dementia. 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 However, the neurological root cause required for this diagnosis is uncertain. Continued medical monitoring will be important moving forward.   Recommendations: A repeat neuropsychological evaluation in 18-24 months (or sooner if functional decline is noted) is recommended to assess the trajectory of future cognitive decline should it occur. This will also aid in future efforts towards improved diagnostic clarity. This appointment should be scheduled in the morning to try and avoid afternoon fatigue.   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 She should discuss this referral with Dr. Jannifer Franklin. He could also consider a referral for outpatient physical/balance therapy to further address these concerns.   A combination of medication and psychotherapy has been shown to be most effective at treating symptoms of anxiety and depression. As such, Jill Chandler is encouraged to speak with her prescribing physician regarding medication adjustments to optimally manage these symptoms. Likewise, Jill Chandler is encouraged to consider engaging in short-term psychotherapy to  address symptoms of psychiatric distress. She would benefit from an active and collaborative therapeutic environment, rather than one purely supportive in nature. Recommended treatment modalities include Cognitive Behavioral Therapy (CBT) or Acceptance and Commitment Therapy (ACT).  If interested, there are some activities which have therapeutic value and can be  useful in keeping her cognitively stimulated. For suggestions, Jill Chandler is encouraged to go to the following website: https://www.[facility].org/get-to-know-barrow/centers-programs/neurorehabilitation-center/neuro-rehab-apps-and-games/ which has options, categorized by level of difficulty. It should be noted that these activities should not be viewed as a substitute for therapy.  When learning new information, she would benefit from information being broken up into small, manageable pieces. She may also find it helpful to articulate the material in her own words and in a context to promote encoding at the onset of a new task. This material may need to be repeated multiple times to promote encoding.  Memory can be improved using internal strategies such as rehearsal, repetition, chunking, mnemonics, association, and imagery. External strategies such as written notes in a consistently used memory journal, visual and nonverbal auditory cues such as a calendar on the refrigerator or appointments with alarm, such as on a cell phone, can also help maximize recall.    To address problems with processing speed, she may wish to consider:   -Ensuring that she is alerted when essential material or instructions are being presented   -Adjusting the speed at which new information is presented   -Allowing for more time in comprehending, processing, and responding in conversation  To address problems with fluctuating attention, she may wish to consider:   -Avoiding external distractions when needing to concentrate   -Limiting exposure to fast paced  environments with multiple sensory demands   -Writing down complicated information and using checklists   -Attempting and completing one task at a time (i.e., no multi-tasking)   -Verbalizing aloud each step of a task to maintain focus   -Reducing the amount of information considered at one time  Review of Records:   Jill Chandler completed a comprehensive neuropsychological evaluation Kandis Nab, Psy.D.) on 12/07/2017. While an isolated impairment was exhibited recalling a previously learned complex figure, performance across all other cognitive domains (including verbal memory) were within normal limits. Despite this, Jill Chandler received a diagnosis of mild cognitive impairment. This was in addition to ADHD by history and ongoing depression-related symptoms.   Jill Chandler was seen by [facility] Margette Fast, M.D.) on 07/04/2019 for an evaluation of memory loss. Briefly, over the past 18 months, Jill Chandler reported increased concerns surrounding perceived cognitive decline. She described having increased difficulty using technology such as a computer or cell phone, as well as trouble with short-term memory and problem solving. She further noted misplacing things around her home. Activities of daily living were described as intact. She reported sleeping fairly well and that she has good energy levels during the day. She did describe changes in her balance and has fallen on several occasions. She denied any numbness, burning, or stinging in her feet. Ultimately, Jill Chandler was referred for a repeat neuropsychological evaluation to characterize the presence of cognitive decline and to assist with diagnostic clarity and treatment planning.   Head CT on 07/13/2005 was unremarkable. Head CT on 03/21/2017 did not reveal any acute intracranial abnormalities. Brain MRI on 06/12/2019 revealed age-related volume loss and mild to moderate small vessel ischemic changes.   Past Medical History:    Diagnosis Date  . ADHD (attention deficit hyperactivity disorder)   . Barrett's esophagus 03/01/2013  . 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 Folic acid deficiency   . GERD (gastroesophageal reflux disease)   . History of adenomatous polyp of colon    TUBULAR ADENOM'S  .  History of cervical dysplasia   . History of ovarian cystectomy    approx. 1981  . History of squamous cell carcinoma in situ    ANAL LESIONS WITH S/P  EXCISIONAL BX 'S--- LOCALIZED--- in doctor office  . 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 Hyperlipidemia 03/08/2009  . Iron deficiency anemia   . Major depressive disorder 10/31/2012  . Mild intermittent asthma without complication 93/02/6710  . Mild neurocognitive disorder 12/07/2017  . OA (osteoarthritis)   . 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 Rosacea, acne   . Rotator cuff arthropathy 08/12/2015  . Serous cystadenoma 2018  . Tenosynovitis of fingers 04/27/2018  . Type II diabetes mellitus with manifestations 04/10/2011   c-peptide = 4.9  Estimated Creatinine Clearance: 52.5 mL/min (by C-G formula based on SCr of 0.68 mg/dL).  Marland Kitchen Uterine fibroid     Past Surgical History:  Procedure Laterality Date  . ANTERIOR CERVICAL DECOMP/DISCECTOMY FUSION  11-12-2005  dr Vertell Limber   C3--4  . BREAST REDUCTION SURGERY Bilateral 11-24-2004   dr Georgia Lopes  . CATARACT EXTRACTION W/ INTRAOCULAR LENS  IMPLANT, BILATERAL  early 2000s approx.  . COLONOSCOPY  last one 02-24-2013  . ESOPHAGOGASTRODUODENOSCOPY  12/02/2015  . LAPAROSCOPIC BILATERAL SALPINGO OOPHERECTOMY Bilateral 10/06/2016   Procedure: LAPAROSCOPIC BILATERAL SALPINGO OOPHORECTOMY;  Surgeon: Paula Compton, MD;  Location: [place];  Service: Gynecology;  Laterality: Bilateral;  . LEEP  YRS AGO   cervical dysplasia  .  OVARIAN CYST REMOVAL  1981 approx.  . TRANSTHORACIC ECHOCARDIOGRAM  04/11/2008   ef 45-80%, grade 1 diastolic dysfunction  . VULVECTOMY PARTIAL  1978 approx.    Current Outpatient Medications:  .  amphetamine-dextroamphetamine (ADDERALL) 20 MG tablet, Take 20 mg by mouth 2 (two) times daily., Disp: , Rfl:  .  blood glucose meter kit and supplies KIT, Use to test blood sugar TID. dxE11.8, Disp: 1 each, Rfl: 1 .  buPROPion (WELLBUTRIN XL) 150 MG 24 hr tablet, TAKE 1 TABLET BY MOUTH EVERY MORNING, Disp: 90 tablet, Rfl: 1 .  Calcium Carbonate-Vitamin D (CALCIUM + D PO), Take 1 tablet by mouth daily. , Disp: , Rfl:  .  ferrous sulfate 324 (65 Fe) MG TBEC, Take 1 tablet by mouth daily. , Disp: , Rfl:  .  fish oil-omega-3 fatty acids 1000 MG capsule, Take 1 g by mouth daily. , Disp: , Rfl:  .  FLUoxetine (PROZAC) 40 MG capsule, Take 80 mg by mouth daily., Disp: , Rfl:  .  glucose blood (ONETOUCH VERIO) test strip, Use to check blood sugar twice a day DX:E11.8, Disp: 200 each, Rfl: 1 .  ibandronate (BONIVA) 150 MG tablet, Take 1 tablet (150 mg total) by mouth every 30 (thirty) days. See instructions., Disp: 3 tablet, Rfl: 3 .  metFORMIN (GLUCOPHAGE-XR) 500 MG 24 hr tablet, Take 500 mg by mouth daily., Disp: , Rfl:  .  metroNIDAZOLE (METROGEL) 0.75 % gel, Apply 1 application topically 2 (two) times daily., Disp: 45 g, Rfl: 5 .  omeprazole (PRILOSEC) 20 MG capsule, Take 1 capsule (20 mg total) by mouth daily., Disp: 90 capsule, Rfl: 1 .  simvastatin (ZOCOR) 20 MG tablet, SMARTSIG:1 Tablet(s) By Mouth Every Evening, Disp: , Rfl:  .  simvastatin (ZOCOR) 40 MG tablet, Take 1 tablet (40 mg total) by mouth at bedtime., Disp:  90 tablet, Rfl: 1 .  traZODone (DESYREL) 150 MG tablet, Take 1 tablet (150 mg total) by mouth at bedtime. TAKE 1 TABLET BY MOUTH AT BEDTIME AS NEEDED FOR SLEEP, Disp: 90 tablet, Rfl: 1  Clinical Interview:   Cognitive Symptoms: Decreased short-term memory: Endorsed. She described  instances where she will forget where going while driving, as well as have trouble recalling the names of familiar individuals. She also acknowledged misplacing objects around her home and an increased reliance on note-taking relative to past behaviors. Per her prior neuropsychological evaluation, memory concerns were first noted in 2016 but were said to have worsened following a fall in February 2019 (MMSE in May 2019 was 28/30). During the current appointment, she reported her belief that cognitive abilities have continued to decline over the past two years.  Decreased long-term memory: Denied. Decreased attention/concentration: Endorsed. She reported being diagnosed with ADHD as a young adult and reported longstanding symptoms of both hyperactivity and inattention dating back to childhood. Recently, she described continued difficulties with sustained attention despite being prescribed Adderall. She also reported difficulties with distractibility and poor organization.  Reduced processing speed: Denied. Difficulties with executive functions: Endorsed. As described above, poor organizational abilities were noted to be longstanding in nature. These were said to be quite significant ("it's a killer") and lead to significant increases in stress and instances where she feels overwhelmed. She denied trouble acting impulsivity, making poor decisions, or the presence of any overt personality changes.  Difficulties with emotion regulation: Denied. Difficulties with receptive language: Denied. Difficulties with word finding: Endorsed. Decreased visuoperceptual ability: Denied.  Difficulties completing ADLs: Denied.  Additional Medical History: History of traumatic brain injury/concussion: Endorsed. She reported sustaining a skull fracture when she was approximately six months old. She further reported breaking her front teeth on at least two occasions due to falls. Around 2017, she described falling down a  flight of stairs head first while carrying trash out. A loss in consciousness was denied. In February 2019, she reported falling down her very steep driveway, ultimately hitting her head. Loss in consciousness was again denied. Her friend convinced her to go to the ED the next day; head CT at that time showedmaxillary sinus fracturesbut no acute intracranial abnormality. More recently, she reported an increased frequency of falls, largely attributed to balance concerns. No recent significant injuries were reported.  History of stroke: Denied. History of seizure activity: Denied. History of known exposure to toxins: Denied. Symptoms of chronic pain: Endorsed. She reported the presence of longstanding neck pain which was notably improved following surgery. Records also suggest a history of facial pain stemming from her February 2019 fall.  Experience of frequent headaches/migraines: Denied. Frequent instances of dizziness/vertigo: Endorsed. She reported ongoing difficulties with feeling dizzy and/or lightheaded. She acknowledged that these symptoms likely contribute to ongoing gait instability. However, she was unclear as to what might be causing these symptoms.   Sensory changes: She reported poor visual acuity, noting that she has a history of a detached retina and that things around her seem very dark (e.g., she commented needing to use a flashlight within her home at times). Other sensory changes/difficulties (e.g., hearing, taste, or smell) were denied.  Balance/coordination difficulties: Endorsed. As described above, balance instability is likely multifactorial in nature and represents a significant concern. She denied weakness in her lower extremities but did endorse difficulties feeling dizzy and/or lightheaded. As described above, she described a history of frequent falling behaviors. Currently, she reported believing that these symptoms are  due to inner ear dysfunction. She denied ever being  referred to an ENT or for outpatient physical/balance therapy.  Other motor difficulties: Denied.  Sleep History: Estimated hours obtained each night: 7-8 hours.  Difficulties falling asleep: Endorsed. However, these difficulties are mitigated when using Trazodone.  Difficulties staying asleep: Endorsed. However, she reported generally listening to an audiobook after being awoken which allows her to fall asleep quickly.  Feels rested and refreshed upon awakening: Endorsed. However, she did acknowledge experiencing afternoon fatigue.   History of snoring: Denied. History of waking up gasping for air: Denied. Witnessed breath cessation while asleep: Denied.  History of vivid dreaming: Denied. Excessive movement while asleep: Denied. Instances of acting out her dreams: Denied.  Psychiatric/Behavioral Health History: Depression: She acknowledged a longstanding history of depression dating back to her 37s. Current medication interventions were said to be very helpful at managing these symptoms presently. She acknowledged a very remote history of suicidal ideation without intent or plan. Current symptoms of suicidal ideation, intent, or plan were denied.  Anxiety: She described the presence of ongoing anxiety largely surrounding various situational factors relating to family stressors or those brought on by the ongoing COVID-19 pandemic. These stressors were described as particularly significant and likely exacerbate ADHD-related cognitive difficulties. She reported several times feeling very overwhelmed and like she has too many things which require her attention at any given point in time.  Mania: Denied. Trauma History: Denied. Visual/auditory hallucinations: Denied. Delusional thoughts: Denied.  Tobacco: Denied. She reported quitting around 2000. Alcohol: She denied current alcohol consumption as well as a history of problematic alcohol abuse or dependence.  Recreational drugs:  Denied. Caffeine: One large cup of espresso coffee in the morning.   Family History: Problem Relation Age of Onset  . Breast cancer Sister 67  . Diabetes Sister   . Hypertension Sister   . Hyperlipidemia Sister   . Breast cancer Sister 14  . Heart disease Mother   . Depression Mother   . Osteoporosis Sister   . Hypertension Father   . Alcohol abuse Father   . Drug abuse Father   . Meniere's disease Father   . Hypertension Brother   . Cancer Neg Hx   . Early death Neg Hx   . Hearing loss Neg Hx   . Kidney disease Neg Hx   . Learning disabilities Neg Hx   . Stroke Neg Hx   . Colon cancer Neg Hx    This information was confirmed by Jill Chandler.  Academic/Vocational History: Highest level of educational attainment: 18 years. She earned a Master's degree in counseling and described herself as a strong (A/B) student in academic settings. No relative weaknesses were identified.   History of developmental delay: Denied. History of grade repetition: Denied. Enrollment in special education courses: Denied. History of LD/ADHD: Endorsed. As described above, she was diagnosed with ADHD as a young adult.   Employment: Retired. 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   Evaluation Results:   Behavioral Observations: Jill Chandler was unaccompanied, arrived to her appointment on time, and was appropriately dressed and groomed. She appeared alert and oriented. Observed gait and station were within normal limits. Gross motor functioning appeared intact upon informal observation and no abnormal movements (e.g., tremors) were noted. Her affect was generally relaxed and positive, but did range appropriately given the subject being discussed during the  clinical interview or the task at hand during testing procedures. Spontaneous speech was fluent and word finding  difficulties were not observed during the clinical interview. Thought processes were coherent and normal in content. She appeared disorganized at times and was occasionally tangential in her responses during interview. Insight into her cognitive difficulties appeared adequate. During testing, sustained attention was adequate. Task engagement was generally adequate and she persisted when challenged. However, she did fatigue as the evaluation progressed and ultimately discontinued the evaluation prematurely. As such, not all tasks from the previous evaluation were able to be replicated. Overall, Jill Chandler was cooperative with the clinical interview and a majority of subsequent testing procedures.   Adequacy of Effort: The validity of neuropsychological testing is limited by the extent to which the individual being tested may be assumed to have exerted adequate effort during testing. Jill Chandler expressed her intention to perform to the best of her abilities and exhibited adequate task engagement and persistence. Scores across stand-alone and embedded performance validity measures were within expectation. As such, the results of the current evaluation are believed to be a valid representation of Ms. Waters current cognitive functioning.  Test Results: Ms. Nerio was largely oriented at the time of the current evaluation.  Intellectual abilities based upon educational and vocational attainment were estimated to be in the average range. Premorbid abilities were estimated to be within the well above average range based upon a single-word reading test.   Processing speed was well below average to below average. Basic attention was above average. More complex attention (e.g., working memory) was average to above average. Assessed executive functioning (i.e., cognitive flexibility) was variable, ranging from the well below average to average normative ranges.  While not directly assessed, receptive language abilities  were believed to be intact as Ms. Piscopo did not exhibit any difficulties comprehending task instructions and answered all questions asked of her appropriately. Assessed expressive language was variable. Phonemic fluency was average to above average, semantic fluency was exceptionally low to below average, and confrontation naming was above average.     Assessed visuospatial/visuoconstructional abilities were average.     Learning (i.e., encoding) of novel verbal information was average. Spontaneous delayed recall (i.e., retrieval) of previously learned information was below average to average across a list learning task and well below average across a complex figure drawing task. Retention rates were 55-67% across a list learning task and 25% across a complex figure drawing task. Performance across a list learning recognition task was appropriate, suggesting evidence for information consolidation.   Results of emotional screening instruments suggested that recent symptoms of generalized anxiety were in the severe range, while symptoms of depression were within normal limits. A screening instrument assessing recent sleep quality suggested the presence of minimal sleep dysfunction.  Tables of Scores:   Note: This summary of test scores accompanies the interpretive report and should not be considered in isolation without reference to the appropriate sections in the text. Descriptors are based on appropriate normative data and may be adjusted based on clinical judgment. The terms "impaired" and "within normal limits (WNL)" are used when a more specific level of functioning cannot be determined. Descriptors refer to the current evaluation only.        Effort Testing:    [facility]   November 2019 Current    Dot Counting Test: --- --- --- Within Expectation  WAIS-IV Reliable Digit Span: --- --- --- Within Expectation  CVLT-III Forced Choice Recognition: --- --- --- Within Expectation  Orientation:        Raw Score Raw Score Percentile   NAB Orientation, Form 1 --- 28/29 --- ---        Intellectual Functioning:             Standard Score Standard Score Percentile   Test of Premorbid Functioning: 123 125 95 Well Above Average        Memory:            Wechsler Memory Scale (WMS-IV):                       Raw Score Raw Score Percentile     Logical Memory I 39/53 Not attempted (fatigue) --- ---    Logical Memory II 24/39 --- --- ---    Logical Memory Recognition 23/23 --- --- ---        [place] Verbal Learning Test (CVLT-III) Brief Form: Raw Score Raw Score (Scaled/Standard Score) Percentile     Total Trials 1-4 30/36 24/36 (95) 37 Average    Short-Delay Free Recall 8/9 7/9 (10) 50 Average    Long-Delay Free Recall 8/9 5/9 (7) 16 Below Average    Long-Delay Cued Recall 6/9 6/9 (8) 25 Average    Recognition Hits 8/9 9/9 (13) 84 Above Average    False Positive Errors 0 2 (7) 16 Below Average         Raw Score Raw Score (Scaled Score) Percentile   RBANS Figure Copy: 16/20 16/20 (7) 16 Below Average  RBANS Figure Recall: 2/20 4/20 (4) 2 Well Below Average        Attention/Executive Function:            Trail Making Test (TMT): Raw Score Raw Score (T Score) Percentile     Part A 42 secs.,  0 errors 57 secs.,  1 error (33) 5 Well Below Average    Part B 72 secs.,  0 errors 97 secs.,  0 errors (43) 25 Average          Scaled Score Scaled Score Percentile   WAIS-IV Coding: _0 Below Average         Scaled Score Scaled Score Percentile   WAIS-IV Digit Span: --- 13 84 Above Average    Forward 17 13 84 Above Average    Backward 15 14 91 Above Average    Sequencing --- 10 50 Average        D-KEFS Verbal Fluency Test: Raw Score Raw Score (Scaled Score) Percentile     Letter Total Correct --- 48 (14) 91 Above Average    Category Total Correct --- 22 (6) 9 Below Average    Category Switching Total Correct --- 7 (4) 2 Well Below Average    Category Switching Accuracy  --- 6 (5) 5 Well Below Average      Total Set Loss Errors --- 4 (8) 25 Average      Total Repetition Errors --- 1 (12) 75 Above Average        Language:            Verbal Fluency Test: Raw Score Raw Score (T Score) Percentile     Phonemic Fluency (FAS) 54 48 (52) 58 Average    Animal Fluency 21 11 (26) 1 Exceptionally Low         NAB Language Module, Form 1: T Score T Score Percentile     Naming --- 31/31 (58) 79 Above Average  Raw Score Raw Score Percentile   [place] Naming Test (BNT): 53/60 --- --- ---        Visuospatial/Visuoconstruction:       Raw Score Raw Score Percentile   Clock Drawing: Slightly impaired 10/10 --- Within Normal Limits         Scaled Score Scaled Score Percentile   WAIS-IV Block Design: _0 Average        Mood and Personality:       Raw Score Raw Score Percentile   Geriatric Depression Scale: --- 6 --- Within Normal Limits  Geriatric Anxiety Scale: --- 30 --- Severe    Somatic --- 11 --- Moderate    Cognitive --- 13 --- Severe    Affective --- 6 --- Mild        Additional Questionnaires:       Raw Score Raw Score Percentile   PROMIS Sleep Disturbance Questionnaire: --- 23 --- None to Slight   Informed Consent and Coding/Compliance:   Ms. Slavick was provided with a verbal description of the nature and purpose of the present neuropsychological evaluation. Also reviewed were the foreseeable risks and/or discomforts and benefits of the procedure, limits of confidentiality, and mandatory reporting requirements of this provider. The patient was given the opportunity to ask questions and receive answers about the evaluation. Oral consent to participate was provided by the patient.   This evaluation was conducted by Christia Reading, Ph.D., licensed clinical neuropsychologist. Ms. Rachels completed a comprehensive clinical interview with Dr. Melvyn Novas, billed as one unit [phone], and 110 minutes of cognitive testing and scoring, billed as one unit [phone] and three  additional units [id number]. Psychometrist Milana Kidney, B.S., assisted Dr. Melvyn Novas with test administration and scoring procedures. As a separate and discrete service, Dr. Melvyn Novas spent a total of 160 minutes in interpretation and report writing billed as one unit [phone] and two units [id number].

## 2019-08-16 ENCOUNTER — Encounter: Payer: Self-pay | Admitting: Psychology

## 2019-08-16 ENCOUNTER — Telehealth: Payer: Self-pay | Admitting: Neurology

## 2019-08-16 DIAGNOSIS — F411 Generalized anxiety disorder: Secondary | ICD-10-CM | POA: Insufficient documentation

## 2019-08-16 NOTE — Patient Instructions (Signed)
Clinical Impression(s): Jill Chandler pattern of performance is suggestive of primary weaknesses in processing speed, semantic fluency, and retrieval aspects of visual memory. Further performance variability was noted across cognitive flexibility. Performance was largely appropriate across domains of attention/concentration, receptive language, phonemic fluency, confrontation naming, visuospatial abilities, and verbal learning and memory. Jill Chandler denied difficulties completing instrumental activities of daily living (ADLs) independently.  Jill Chandler did fatigue as the evaluation progressed and ultimately discontinued the evaluation prematurely. As such, not all tasks from the previous evaluation were able to be replicated. However, relative to her previous evaluation, many of her scores exhibited stability, including weaknesses across the retrieval of previously learned visual information. Performance declines were seen across domains of processing speed, cognitive flexibility, and semantic fluency. Improvements were seen across confrontation naming.  Regarding etiology, Jill Chandler reported severe levels of acute anxiety across a related questionnaire. She also reported generally feeling overwhelmed and experiencing significant stress in her daily life. It is quite likely that the increases in psychiatric and psychosocial distress exacerbate underlying attentional dysregulation given her history of ADHD. She also reported significant symptoms of fatigue which did appear to impact the current evaluation. All these factors could explain performance declines, as well as current weaknesses and/or variability across processing speed, executive functioning, and semantic fluency. They could also explain subjective difficulties with verbal memory. Neuroimaging suggesting mild to moderate small vessel ischemic changes could also suggest a vascular contribution to her presentation which would affect similar cognitive  domains. While I cannot rule out an underlying neurodegenerative condition, current results do not mimic patterns for more common illnesses such as Alzheimer's disease or Lewy body dementia. 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 However, the neurological root cause required for this diagnosis is uncertain. Continued medical monitoring will be important moving forward.

## 2019-08-16 NOTE — Telephone Encounter (Signed)
Neuropsychological evaluation suggests mild cognitive impairment, but there appears to be some underlying negative factors such as anxiety, and history of ADHD.   Neuropsychological evaluation results 08/16/19:  Regarding etiology, Jill Chandler reported severe levels of acute anxiety across a related questionnaire. She also reported generally feeling overwhelmed and experiencing significant stress in her daily life. It is quite likely that the increases in psychiatric and psychosocial distress exacerbate underlying attentional dysregulation given her history of ADHD. She also reported significant symptoms of fatigue which did appear to impact the current evaluation. All these factors could explain performance declines, as well as current weaknesses and/or variability across processing speed, executive functioning, and semantic fluency. They could also explain subjective difficulties with verbal memory. Neuroimaging suggesting mild to moderate small vessel ischemic changes could also suggest a vascular contribution to her presentation which would affect similar cognitive domains. While I cannot rule out an underlying neurodegenerative condition, current results do not mimic patterns for more common illnesses such as Alzheimer's disease or Lewy body dementia. 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 However, the neurological root cause required for this diagnosis is uncertain. Continued medical monitoring will be important moving forward.

## 2019-08-21 ENCOUNTER — Telehealth: Payer: Self-pay | Admitting: Neurology

## 2019-08-22 ENCOUNTER — Other Ambulatory Visit: Payer: Self-pay

## 2019-08-22 ENCOUNTER — Ambulatory Visit (INDEPENDENT_AMBULATORY_CARE_PROVIDER_SITE_OTHER): Payer: Medicare Other | Admitting: Psychology

## 2019-08-22 DIAGNOSIS — R2689 Other abnormalities of gait and mobility: Secondary | ICD-10-CM

## 2019-08-22 DIAGNOSIS — F411 Generalized anxiety disorder: Secondary | ICD-10-CM | POA: Diagnosis not present

## 2019-08-22 DIAGNOSIS — F902 Attention-deficit hyperactivity disorder, combined type: Secondary | ICD-10-CM | POA: Diagnosis not present

## 2019-08-22 DIAGNOSIS — G3184 Mild cognitive impairment, so stated: Secondary | ICD-10-CM

## 2019-08-22 NOTE — Patient Instructions (Signed)
Recommendations: A repeat neuropsychological evaluation in 18-24 months (or sooner if functional decline is noted) is recommended to assess the trajectory of future cognitive decline should it occur. This will also aid in future efforts towards improved diagnostic clarity. This appointment should be scheduled in the morning to try and avoid afternoon fatigue.   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 She should discuss this referral with Dr. Jannifer Franklin. He could also consider a referral for outpatient physical/balance therapy to further address these concerns.   A combination of medication and psychotherapy has been shown to be most effective at treating symptoms of anxiety and depression. As such, Jill Chandler is encouraged to speak with her prescribing physician regarding medication adjustments to optimally manage these symptoms. Likewise, Jill Chandler is encouraged to consider engaging in short-term psychotherapy to address symptoms of psychiatric distress. She would benefit from an active and collaborative therapeutic environment, rather than one purely supportive in nature. Recommended treatment modalities include Cognitive Behavioral Therapy (CBT) or Acceptance and Commitment Therapy (ACT).  If interested, there are some activities which have therapeutic value and can be useful in keeping her cognitively stimulated. For suggestions, Jill Chandler is encouraged to go to the following website: https://www.[facility].org/get-to-know-barrow/centers-programs/neurorehabilitation-center/neuro-rehab-apps-and-games/ which has options, categorized by level of difficulty. It should be noted that these activities should not be viewed as a substitute for therapy.  When learning new information, she would benefit from information being broken up into small, manageable pieces. She may also find it helpful to articulate the  material in her own words and in a context to promote encoding at the onset of a new task. This material may need to be repeated multiple times to promote encoding.  Memory can be improved using internal strategies such as rehearsal, repetition, chunking, mnemonics, association, and imagery. External strategies such as written notes in a consistently used memory journal, visual and nonverbal auditory cues such as a calendar on the refrigerator or appointments with alarm, such as on a cell phone, can also help maximize recall.    To address problems with processing speed, she may wish to consider:   -Ensuring that she is alerted when essential material or instructions are being presented   -Adjusting the speed at which new information is presented   -Allowing for more time in comprehending, processing, and responding in conversation  To address problems with fluctuating attention, she may wish to consider:   -Avoiding external distractions when needing to concentrate   -Limiting exposure to fast paced environments with multiple sensory demands   -Writing down complicated information and using checklists   -Attempting and completing one task at a time (i.e., no multi-tasking)   -Verbalizing aloud each step of a task to maintain focus   -Reducing the amount of information considered at one time

## 2019-08-22 NOTE — Progress Notes (Signed)
   Neuropsychology Feedback Session Jill Chandler Department of Neurology  Reason for Referral:   Jill Chandler a 74 y.o. right-handed Caucasian female referred by Margette Fast, M.D.,to characterize hercurrent cognitive functioning and assist with diagnostic clarity and treatment planning in the context of subjective cognitive decline.   Feedback:   Jill Chandler completed a comprehensive neuropsychological evaluation on 08/15/2019. Please refer to that encounter for the full report and recommendations. Briefly, results suggested primary weaknesses in processing speed, semantic fluency, and retrieval aspects of visual memory. Further performance variability was noted across cognitive flexibility. relative to her previous evaluation, many of her scores exhibited stability, including weaknesses across the retrieval of previously learned visual information. Performance declines were seen across domains of processing speed, cognitive flexibility, and semantic fluency. Improvements were seen across confrontation naming. Regarding etiology, Jill Chandler reported severe levels of acute anxiety across a related questionnaire. She also reported generally feeling overwhelmed and experiencing significant stress in her daily life. It is quite likely that the increases in psychiatric and psychosocial distress exacerbate underlying attentional dysregulation given her history of ADHD. She also reported significant symptoms of fatigue which did appear to impact the current evaluation. All these factors could explain performance declines, as well as current weaknesses and/or variability across processing speed, executive functioning, and semantic fluency. They could also explain subjective difficulties with verbal memory.  Jill Chandler was unaccompanied during the current telephone call. She was within her residence while I was within my office. Content of the current session focused on the results of  her neuropsychological evaluation. Jill Chandler was given the opportunity to ask questions and her questions were answered. 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 She was encouraged to reach out should additional questions arise. Her report is available to her on MyChart.      19 minutes were spent conducting the current feedback session with Jill Chandler, billed as one unit [phone].

## 2019-09-25 ENCOUNTER — Ambulatory Visit: Payer: Self-pay | Admitting: Neurology

## 2020-01-02 ENCOUNTER — Ambulatory Visit: Payer: Medicare Other | Admitting: Neurology

## 2020-04-23 ENCOUNTER — Other Ambulatory Visit: Payer: Self-pay

## 2020-04-23 ENCOUNTER — Ambulatory Visit: Payer: Medicare HMO | Admitting: Endocrinology

## 2020-04-23 ENCOUNTER — Telehealth: Payer: Self-pay | Admitting: Endocrinology

## 2020-04-23 DIAGNOSIS — E118 Type 2 diabetes mellitus with unspecified complications: Secondary | ICD-10-CM

## 2020-04-23 DIAGNOSIS — M810 Age-related osteoporosis without current pathological fracture: Secondary | ICD-10-CM

## 2020-04-23 LAB — POCT GLYCOSYLATED HEMOGLOBIN (HGB A1C): Hemoglobin A1C: 6.1 % — AB (ref 4.0–5.6)

## 2020-04-23 LAB — BASIC METABOLIC PANEL
BUN: 19 mg/dL (ref 6–23)
CO2: 28 mEq/L (ref 19–32)
Calcium: 10.1 mg/dL (ref 8.4–10.5)
Chloride: 101 mEq/L (ref 96–112)
Creatinine, Ser: 0.78 mg/dL (ref 0.40–1.20)
GFR: 75.78 mL/min (ref >60.00)
Glucose, Bld: 142 mg/dL — ABNORMAL HIGH (ref 70–99)
Potassium: 4.2 mEq/L (ref 3.5–5.1)
Sodium: 138 mEq/L (ref 135–145)

## 2020-04-23 LAB — VITAMIN D 25 HYDROXY (VIT D DEFICIENCY, FRACTURES): VITD: 45.62 ng/mL (ref 30.00–100.00)

## 2020-04-23 LAB — TSH: TSH: 1.98 u[IU]/mL (ref 0.35–4.50)

## 2020-04-23 NOTE — Patient Instructions (Addendum)
Out office is checking to see if your new insurance will pay for the Prolia.  Please continue the same medications for now.  Blood tests are requested for you today.  We'll let you know about the results.  Please come back for a follow-up appointment in 6 months.       Fall Prevention in the Home, Adult Falls can cause injuries and can affect people from all age groups. There are many simple things that you can do to make your home safe and to help prevent falls. Ask for help when making these changes, if needed. What actions can I take to prevent falls? General instructions  Use good lighting in all rooms. Replace any light bulbs that burn out.  Turn on lights if it is dark. Use night-lights.  Place frequently used items in easy-to-reach places. Lower the shelves around your home if necessary.  Set up furniture so that there are clear paths around it. Avoid moving your furniture around.  Remove throw rugs and other tripping hazards from the floor.  Avoid walking on wet floors.  Fix any uneven floor surfaces.  Add color or contrast paint or tape to grab bars and handrails in your home. Place contrasting color strips on the first and last steps of stairways.  When you use a stepladder, make sure that it is completely opened and that the sides are firmly locked. Have someone hold the ladder while you are using it. Do not climb a closed stepladder.  Be aware of any and all pets. What can I do in the bathroom?  Keep the floor dry. Immediately clean up any water that spills onto the floor.  Remove soap buildup in the tub or shower on a regular basis.  Use non-skid mats or decals on the floor of the tub or shower.  Attach bath mats securely with double-sided, non-slip rug tape.  If you need to sit down while you are in the shower, use a plastic, non-slip stool.  Install grab bars by the toilet and in the tub and shower. Do not use towel bars as grab bars.      What can I do  in the bedroom?  Make sure that a bedside light is easy to reach.  Do not use oversized bedding that drapes onto the floor.  Have a firm chair that has side arms to use for getting dressed. What can I do in the kitchen?  Clean up any spills right away.  If you need to reach for something above you, use a sturdy step stool that has a grab bar.  Keep electrical cables out of the way.  Do not use floor polish or wax that makes floors slippery. If you must use wax, make sure that it is non-skid floor wax. What can I do in the stairways?  Do not leave any items on the stairs.  Make sure that you have a light switch at the top of the stairs and the bottom of the stairs. Have them installed if you do not have them.  Make sure that there are handrails on both sides of the stairs. Fix handrails that are broken or loose. Make sure that handrails are as long as the stairways.  Install non-slip stair treads on all stairs in your home.  Avoid having throw rugs at the top or bottom of stairways, or secure the rugs with carpet tape to prevent them from moving.  Choose a carpet design that does not hide the edge  of steps on the stairway.  Check any carpeting to make sure that it is firmly attached to the stairs. Fix any carpet that is loose or worn. What can I do on the outside of my home?  Use bright outdoor lighting.  Regularly repair the edges of walkways and driveways and fix any cracks.  Remove high doorway thresholds.  Trim any shrubbery on the main path into your home.  Regularly check that handrails are securely fastened and in good repair. Both sides of any steps should have handrails.  Install guardrails along the edges of any raised decks or porches.  Clear walkways of debris and clutter, including tools and rocks.  Have leaves, snow, and ice cleared regularly.  Use sand or salt on walkways during winter months.  In the garage, clean up any spills right away, including  grease or oil spills. What other actions can I take?  Wear closed-toe shoes that fit well and support your feet. Wear shoes that have rubber soles or low heels.  Use mobility aids as needed, such as canes, walkers, scooters, and crutches.  Review your medicines with your health care provider. Some medicines can cause dizziness or changes in blood pressure, which increase your risk of falling. Talk with your health care provider about other ways that you can decrease your risk of falls. This may include working with a physical therapist or trainer to improve your strength, balance, and endurance. Where to find more information  Centers for Disease Control and Prevention, STEADI: WebmailGuide.co.za  [employer] on Aging: BrainJudge.co.uk Contact a health care provider if:  You are afraid of falling at home.  You feel weak, drowsy, or dizzy at home.  You fall at home. Summary  There are many simple things that you can do to make your home safe and to help prevent falls.  Ways to make your home safe include removing tripping hazards and installing grab bars in the bathroom.  Ask for help when making these changes in your home. This information is not intended to replace advice given to you by your health care provider. Make sure you discuss any questions you have with your health care provider. Document Revised: 12/25/2016 Document Reviewed: 08/27/2016 Elsevier Patient Education  2021 [employer].

## 2020-04-23 NOTE — Telephone Encounter (Signed)
Ins hjas changed to [facility].  Can you please check to see what pt's copay for prolia would be on this?  TY

## 2020-04-23 NOTE — Progress Notes (Signed)
Subjective:    Patient ID: Jill Chandler, female    DOB: 1945-05-29, 75 y.o.   MRN: [id number]  HPI Pt returns for f/u of osteoporosis: Dx'ed: 2002 Secondary cause: Vit-D def Fractures: several facial (2019), and ribs (2021), both due to falls.   Past rx: reclast (2017), and Prolia (2019-2021) Current rx: Boniva, since 2018.   Last DEXA result (2021): worst T-score was -2.3 (RFN).   Other: she took high-dose ergocalciferol 2019-2020, but not recently.   Interval hx: She takes Boniva as rx'ed.  pt states she feels well in general, except for intermitt falls.  She stopped Prolia, due to cost.  However, ins has changed to [facility]. She has not recently taken Vit-D.  Past Medical History:  Diagnosis Date  . ADHD (attention deficit hyperactivity disorder)   . Barrett's esophagus 03/01/2013  . 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 Folic acid deficiency   . Generalized anxiety disorder   . GERD (gastroesophageal reflux disease)   . History of adenomatous polyp of colon    TUBULAR ADENOM'S  . History of cervical dysplasia   . History of ovarian cystectomy    approx. 1981  . History of squamous cell carcinoma in situ    ANAL LESIONS WITH S/P  EXCISIONAL BX 'S--- LOCALIZED--- in doctor office  . 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 Hyperlipidemia 03/08/2009  . Iron deficiency anemia   . Major depressive disorder 10/31/2012  . Mild intermittent asthma without complication 93/08/1827  . Mild neurocognitive disorder 12/07/2017  . OA (osteoarthritis)   . 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 Rosacea, acne   . Rotator cuff arthropathy 08/12/2015  . Serous cystadenoma 2018  . Tenosynovitis of fingers 04/27/2018  . Type II diabetes mellitus with manifestations 04/10/2011   c-peptide = 4.9  Estimated  Creatinine Clearance: 52.5 mL/min (by C-G formula based on SCr of 0.68 mg/dL).  Marland Kitchen Uterine fibroid     Past Surgical History:  Procedure Laterality Date  . ANTERIOR CERVICAL DECOMP/DISCECTOMY FUSION  11-12-2005  dr Vertell Limber   C3--4  . BREAST REDUCTION SURGERY Bilateral 11-24-2004   dr Georgia Lopes  . CATARACT EXTRACTION W/ INTRAOCULAR LENS  IMPLANT, BILATERAL  early 2000s approx.  . COLONOSCOPY  last one 02-24-2013  . ESOPHAGOGASTRODUODENOSCOPY  12/02/2015  . LAPAROSCOPIC BILATERAL SALPINGO OOPHERECTOMY Bilateral 10/06/2016   Procedure: LAPAROSCOPIC BILATERAL SALPINGO OOPHORECTOMY;  Surgeon: Paula Compton, MD;  Location: [place];  Service: Gynecology;  Laterality: Bilateral;  . LEEP  YRS AGO   cervical dysplasia  . OVARIAN CYST REMOVAL  1981 approx.  . TRANSTHORACIC ECHOCARDIOGRAM  04/11/2008   ef 93-71%, grade 1 diastolic dysfunction  . VULVECTOMY PARTIAL  1978 approx.    Social History   Socioeconomic History  . Marital status: Single    Spouse name: Not on file  . Number of children: 0  . Years of education: 38  . Highest education level: Master's degree (e.g., MA, MS, MEng, MEd, MSW, MBA)  Occupational History  . Occupation: Retired    Comment: Psychotherapist  Tobacco Use  . Smoking status: Former Smoker    Years: 30.00    Types: Cigarettes    Quit date: 09/07/1998    Years since quitting:  21.6  . Smokeless tobacco: Never Used  Vaping Use  . Vaping Use: Never used  Substance and Sexual Activity  . Alcohol use: Not Currently    Comment: Occasionally  . Drug use: No  . Sexual activity: Yes    Birth control/protection: Post-menopausal  Other Topics Concern  . 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 3 caffeinated beverages daily.   Retired 03/2018      Social Determinants of Radio broadcast assistant Strain: Not on [employer] Insecurity: Not on file   Transportation Needs: Not on file  Physical Activity: Not on file  Stress: Not on file  Social Connections: Not on file  Intimate Partner Violence: Not on file    Current Outpatient Medications on File Prior to Visit  Medication Sig Dispense Refill  . amphetamine-dextroamphetamine (ADDERALL) 20 MG tablet Take 20 mg by mouth 2 (two) times daily.    . blood glucose meter kit and supplies KIT Use to test blood sugar TID. dxE11.8 1 each 1  . buPROPion (WELLBUTRIN XL) 150 MG 24 hr tablet TAKE 1 TABLET BY MOUTH EVERY MORNING 90 tablet 1  . Calcium Carbonate-Vitamin D (CALCIUM + D PO) Take 1 tablet by mouth daily.     . ferrous sulfate 324 (65 Fe) MG TBEC Take 1 tablet by mouth daily.     . fish oil-omega-3 fatty acids 1000 MG capsule Take 1 g by mouth daily.     Marland Kitchen FLUoxetine (PROZAC) 40 MG capsule Take 80 mg by mouth daily.    Marland Kitchen glucose blood (ONETOUCH VERIO) test strip Use to check blood sugar twice a day DX:E11.8 200 each 1  . ibandronate (BONIVA) 150 MG tablet Take 1 tablet (150 mg total) by mouth every 30 (thirty) days. See instructions. 3 tablet 3  . metFORMIN (GLUCOPHAGE-XR) 500 MG 24 hr tablet Take 500 mg by mouth daily.    . metroNIDAZOLE (METROGEL) 0.75 % gel Apply 1 application topically 2 (two) times daily. 45 g 5  . omeprazole (PRILOSEC) 20 MG capsule Take 1 capsule (20 mg total) by mouth daily. 90 capsule 1  . simvastatin (ZOCOR) 20 MG tablet SMARTSIG:1 Tablet(s) By Mouth Every Evening    . simvastatin (ZOCOR) 40 MG tablet Take 1 tablet (40 mg total) by mouth at bedtime. 90 tablet 1  . traZODone (DESYREL) 150 MG tablet Take 1 tablet (150 mg total) by mouth at bedtime. TAKE 1 TABLET BY MOUTH AT BEDTIME AS NEEDED FOR SLEEP 90 tablet 1   No current facility-administered medications on file prior to visit.    No Known Allergies  Family History  Problem Relation Age of Onset  . Breast cancer Sister 48  . Diabetes Sister   . Hypertension Sister   . Hyperlipidemia Sister   .  Breast cancer Sister 67  . Heart disease Mother   . Depression Mother   . Osteoporosis Sister   . Hypertension Father   . Alcohol abuse Father   . Drug abuse Father   . Meniere's disease Father   . Hypertension Brother   . Cancer Neg Hx   . Early death Neg Hx   . Hearing loss Neg Hx   . Kidney disease Neg Hx   . Learning disabilities Neg Hx   . Stroke Neg Hx   . Colon cancer Neg Hx     There were no vitals taken for this  visit.   Review of Systems Denies LOC    Objective:   Physical Exam GENERAL: no distress.   GAIT: normal and steady.   Lab Results  Component Value Date   HGBA1C 6.1 (A) 04/23/2020       Assessment & Plan:  Osteoporosis: uncontrolled.  She should resume Prolia if possible.   Vit-D Def: due to recheck  Patient Instructions   Out office is checking to see if your new insurance will pay for the Prolia.  Please continue the same medications for now.  Blood tests are requested for you today.  We'll let you know about the results.  Please come back for a follow-up appointment in 6 months.       Fall Prevention in the Home, Adult Falls can cause injuries and can affect people from all age groups. There are many simple things that you can do to make your home safe and to help prevent falls. Ask for help when making these changes, if needed. What actions can I take to prevent falls? General instructions  Use good lighting in all rooms. Replace any light bulbs that burn out.  Turn on lights if it is dark. Use night-lights.  Place frequently used items in easy-to-reach places. Lower the shelves around your home if necessary.  Set up furniture so that there are clear paths around it. Avoid moving your furniture around.  Remove throw rugs and other tripping hazards from the floor.  Avoid walking on wet floors.  Fix any uneven floor surfaces.  Add color or contrast paint or tape to grab bars and handrails in your home. Place contrasting color  strips on the first and last steps of stairways.  When you use a stepladder, make sure that it is completely opened and that the sides are firmly locked. Have someone hold the ladder while you are using it. Do not climb a closed stepladder.  Be aware of any and all pets. What can I do in the bathroom?  Keep the floor dry. Immediately clean up any water that spills onto the floor.  Remove soap buildup in the tub or shower on a regular basis.  Use non-skid mats or decals on the floor of the tub or shower.  Attach bath mats securely with double-sided, non-slip rug tape.  If you need to sit down while you are in the shower, use a plastic, non-slip stool.  Install grab bars by the toilet and in the tub and shower. Do not use towel bars as grab bars.      What can I do in the bedroom?  Make sure that a bedside light is easy to reach.  Do not use oversized bedding that drapes onto the floor.  Have a firm chair that has side arms to use for getting dressed. What can I do in the kitchen?  Clean up any spills right away.  If you need to reach for something above you, use a sturdy step stool that has a grab bar.  Keep electrical cables out of the way.  Do not use floor polish or wax that makes floors slippery. If you must use wax, make sure that it is non-skid floor wax. What can I do in the stairways?  Do not leave any items on the stairs.  Make sure that you have a light switch at the top of the stairs and the bottom of the stairs. Have them installed if you do not have them.  Make sure that there are  handrails on both sides of the stairs. Fix handrails that are broken or loose. Make sure that handrails are as long as the stairways.  Install non-slip stair treads on all stairs in your home.  Avoid having throw rugs at the top or bottom of stairways, or secure the rugs with carpet tape to prevent them from moving.  Choose a carpet design that does not hide the edge of steps on  the stairway.  Check any carpeting to make sure that it is firmly attached to the stairs. Fix any carpet that is loose or worn. What can I do on the outside of my home?  Use bright outdoor lighting.  Regularly repair the edges of walkways and driveways and fix any cracks.  Remove high doorway thresholds.  Trim any shrubbery on the main path into your home.  Regularly check that handrails are securely fastened and in good repair. Both sides of any steps should have handrails.  Install guardrails along the edges of any raised decks or porches.  Clear walkways of debris and clutter, including tools and rocks.  Have leaves, snow, and ice cleared regularly.  Use sand or salt on walkways during winter months.  In the garage, clean up any spills right away, including grease or oil spills. What other actions can I take?  Wear closed-toe shoes that fit well and support your feet. Wear shoes that have rubber soles or low heels.  Use mobility aids as needed, such as canes, walkers, scooters, and crutches.  Review your medicines with your health care provider. Some medicines can cause dizziness or changes in blood pressure, which increase your risk of falling. Talk with your health care provider about other ways that you can decrease your risk of falls. This may include working with a physical therapist or trainer to improve your strength, balance, and endurance. Where to find more information  Centers for Disease Control and Prevention, STEADI: WebmailGuide.co.za  [employer] on Aging: BrainJudge.co.uk Contact a health care provider if:  You are afraid of falling at home.  You feel weak, drowsy, or dizzy at home.  You fall at home. Summary  There are many simple things that you can do to make your home safe and to help prevent falls.  Ways to make your home safe include removing tripping hazards and installing grab bars in the bathroom.  Ask for help when  making these changes in your home. This information is not intended to replace advice given to you by your health care provider. Make sure you discuss any questions you have with your health care provider. Document Revised: 12/25/2016 Document Reviewed: 08/27/2016 Elsevier Patient Education  2021 [employer].

## 2020-04-23 NOTE — Telephone Encounter (Signed)
Please F/Uwith this.  Thank you,  Shemeika Starzyk/CMA 

## 2020-04-24 LAB — PTH, INTACT AND CALCIUM
Calcium: 10 mg/dL (ref 8.6–10.4)
PTH: 30 pg/mL (ref 16–77)

## 2020-04-24 NOTE — Telephone Encounter (Signed)
Dx'ed: 2002 Secondary cause: Vit-D def Fractures: several facial (2019), and ribs (2021), both due to falls.   Past rx: reclast (2017), and Prolia (2019-2021) Current rx: Boniva, since 2018.   Last DEXA result (2021): worst T-score was -2.3 (RFN).   Other: she took high-dose ergocalciferol 2019-2020, but not recently.   Interval hx: She takes Boniva as rx'ed.  pt states she feels well in general, except for intermitt falls.  She stopped Prolia, due to cost.  However, ins has changed to [facility]. She has not recently taken Vit-D.

## 2020-04-24 NOTE — Telephone Encounter (Signed)
Prolia VOB initiated via [email or web address] 

## 2020-04-25 NOTE — Telephone Encounter (Signed)
04/24/2020 [place] [address]  [place] [address] Re: Policy Termination Update, [employer] Assist ID [id number] Dear Dr. Renato Shin, In follow up to your request, [place] has researched insurance coverage for therapy with Prolia (denosumab) for your patient, Jill Chandler with DOB 1945-12-30. Your patient's health plan has advised that your patient's policy terminated on 13/64/3837. Please contact [place] to provide updated insurance information if available, and to request an insurance verification for the patient's new insurance. If you have any questions about this letter, please call Amgen Assist at [phone]), Monday through Friday, 9:00 AM to 8:00 PM Eastern Time. Sincerely, Amgen Assist

## 2020-05-03 NOTE — Telephone Encounter (Signed)
Insurance updated to [employer]   VOB re-submitted via [email or web address]

## 2020-05-08 ENCOUNTER — Encounter: Payer: Self-pay | Admitting: Internal Medicine

## 2020-05-15 NOTE — Telephone Encounter (Signed)
[facility] REQUESTED MEDICAL BENEFIT SUMMARY Patient Out-of-Pocket Responsibility Coverage Available Authorization Required Deductible Co-pay/Coinsurance Prolia OOP COST PHYSICIAN FACILITY FEE ADMIN FEE PURCHASE OR REFERRAL: YES YES PA Required PRIMARY No SECONDARY No 20%* No* 20%* SPECIALTY PHARMACY (via Medical Benefit): NO YES No* No* No* *Reflects patient costs once plan deductible is met. Please see Medical Benefit Details for further information regarding patient costs. Patient costs may vary based on services rendered. BENEFITS VERIFIED FOR THE FOLLOWING DIAGNOSIS AND INSURANCE PLANS Verified for Diagnosis M81.0 Site of Care  MD Office   Policy Level: Primary Policy Status: Active Payer Name: [facility] Plan Name: [place] Number: [id number] Employer Name: [employer] Plan Type: Group Number: [id number]  Payer Phone: [phone] PRIMARY MEDICAL BENEFIT DETAILS (PHYSICIAN PURCHASE, OR REFERRAL TO TREATING SITE) COVERAGE AVAILABLE: Yes COVERAGE DETAILS: Prolia is subject to 20.0% coinsurance and $3,900.00 out of pocket max ($0.00 met). Once met, Prolia will then be covered at 100%. The benefits provided on this Verification of Benefits form are Medical Benefits and are the patient's In-Network benefits for Prolia. If you would like Pharmacy Benefits for Prolia, please call [phone].  AUTHORIZATION REQUIRED: Yes PA PROCESS DETAILS: PA is required. PA can be initiated by calling [phone] or online at [email or web address]

## 2020-05-15 NOTE — Telephone Encounter (Signed)
Jill Chandler (Key: [id number])  Your information has been submitted to [facility]. [employer] will review the request and will issue a decision, typically within 3-7 days from your submission. You can check the updated outcome later by reopening this request.  If [employer] has not responded in 3-7 days or if you have any questions about your ePA request, please contact [employer] at [phone]. If you think there may be a problem with your PA request, use our live chat feature at the bottom right.  For [place] requests, please call [phone].

## 2020-05-16 ENCOUNTER — Telehealth: Payer: Self-pay

## 2020-05-16 NOTE — Telephone Encounter (Signed)
PA from [facility] came in stating PA has been approved for Proli from 05/16/2020-01/25/2021  Thank you

## 2020-05-20 NOTE — Telephone Encounter (Signed)
Pt ready for scheduling  Out-of-pocket cost due at time of visit: $280  Prolia co-insurance: 20% ($255) Admin fee co-insurance: 20% ($25)  Deductible does not apply  [facility] Prolia Prior Auth  PA APPROVED PA# [id number]  Valid 05/16/20-01/25/21

## 2020-05-20 NOTE — Telephone Encounter (Signed)
Arora Benedick Key: [id number] - PA Case ID: [id number] Need help? Call us at [phone] Outcome Approvedon April 21 PA Case: [id number], Status: Approved, Coverage Starts on: 05/16/2020 12:00:00 AM, Coverage Ends on: 01/25/2021 12:00:00 AM. Questions? Contact [phone]. Drug Prolia 60MG /ML syringes Form Nurse, adult and Medical Benefit PA Form

## 2020-06-07 NOTE — Telephone Encounter (Signed)
LVM (x2) to schedule Prolia

## 2020-06-13 ENCOUNTER — Encounter: Payer: Self-pay | Admitting: Endocrinology

## 2020-06-19 NOTE — Telephone Encounter (Signed)
NUR visit scheduled for 06/25/20.

## 2020-06-25 ENCOUNTER — Other Ambulatory Visit: Payer: Self-pay

## 2020-06-25 ENCOUNTER — Ambulatory Visit: Payer: Medicare HMO | Admitting: Endocrinology

## 2020-06-25 DIAGNOSIS — M810 Age-related osteoporosis without current pathological fracture: Secondary | ICD-10-CM

## 2020-06-25 MED ORDER — DENOSUMAB 60 MG/ML ~~LOC~~ SOSY
60.0000 mg | PREFILLED_SYRINGE | Freq: Once | SUBCUTANEOUS | Status: AC
  Administered 2020-06-25: 60 mg via SUBCUTANEOUS

## 2020-06-25 NOTE — Progress Notes (Signed)
Pt came to to receive a Prolia injection. Administered in the back of the Lt arm w/o any complaints

## 2020-06-27 ENCOUNTER — Encounter: Payer: Self-pay | Admitting: Endocrinology

## 2020-06-30 NOTE — Telephone Encounter (Signed)
Pt received Prolia inj 06/25/20 Next due 12/26/20

## 2020-10-29 ENCOUNTER — Other Ambulatory Visit: Payer: Self-pay

## 2020-10-29 ENCOUNTER — Ambulatory Visit (INDEPENDENT_AMBULATORY_CARE_PROVIDER_SITE_OTHER): Payer: Medicare HMO | Admitting: Endocrinology

## 2020-10-29 VITALS — BP 120/62 | HR 82 | Ht 62.0 in | Wt 107.4 lb

## 2020-10-29 DIAGNOSIS — E118 Type 2 diabetes mellitus with unspecified complications: Secondary | ICD-10-CM | POA: Diagnosis not present

## 2020-10-29 DIAGNOSIS — M810 Age-related osteoporosis without current pathological fracture: Secondary | ICD-10-CM

## 2020-10-29 NOTE — Progress Notes (Signed)
Subjective:    Patient ID: Jill Chandler, female    DOB: August 13, 1945, 75 y.o.   MRN: [id number]  HPI Pt returns for f/u of osteoporosis: Dx'ed: 2002 Secondary cause: Vit-D def Fractures: several facial (2019), and ribs (2021), both due to falls.   Past rx: reclast (2017), and Prolia (2019-2021, and 2022) Current rx: Boniva, since 2018.   Last DEXA result (2021): worst T-score was -2.3 (RFN).   Other: she took high-dose ergocalciferol 2019-2020, but not recently.   Interval hx: She takes Boniva as rx'ed.  pt states she feels well in general, except for intermitt falls. However, no more fractures.  She has not recently taken Vit-D.  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  s/p  partial vulvectomy--- per pt no issue since   Folic acid deficiency    Generalized anxiety disorder    GERD (gastroesophageal reflux disease)    History of adenomatous polyp of colon    TUBULAR ADENOM'S   History of cervical dysplasia    History of ovarian cystectomy    approx. 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   s/p  removal vulva lesions in office (dr Kathyrn Drown)   Hyperlipidemia 03/08/2009   Iron deficiency anemia    Major depressive disorder 10/31/2012   Mild intermittent asthma without complication 00/03/4915   Mild neurocognitive disorder 12/07/2017   OA (osteoarthritis)    Osteoporosis, senile 11/30/2012   DEXA -2.28 Oct 2012 She does not want to treat this    Rosacea, acne    Rotator cuff arthropathy 08/12/2015   Serous cystadenoma 2018   Tenosynovitis of fingers 04/27/2018   Type II diabetes mellitus with manifestations 04/10/2011   c-peptide = 4.9  Estimated Creatinine Clearance: 52.5 mL/min (by C-G formula based on SCr of 0.68  mg/dL).   Uterine fibroid     Past Surgical History:  Procedure Laterality Date   ANTERIOR CERVICAL DECOMP/DISCECTOMY FUSION  11-12-2005  dr Vertell Limber   C3--4   BREAST REDUCTION SURGERY Bilateral 11-24-2004   dr Georgia Lopes   CATARACT EXTRACTION W/ INTRAOCULAR LENS  IMPLANT, BILATERAL  early 2000s approx.   COLONOSCOPY  last one 02-24-2013   ESOPHAGOGASTRODUODENOSCOPY  12/02/2015   LAPAROSCOPIC BILATERAL SALPINGO OOPHERECTOMY Bilateral 10/06/2016   Procedure: LAPAROSCOPIC BILATERAL SALPINGO OOPHORECTOMY;  Surgeon: Paula Compton, MD;  Location: [facility];  Service: Gynecology;  Laterality: Bilateral;   LEEP  YRS AGO   cervical dysplasia   OVARIAN CYST REMOVAL  1981 approx.   TRANSTHORACIC ECHOCARDIOGRAM  04/11/2008   ef 91-50%, grade 1 diastolic dysfunction   VULVECTOMY PARTIAL  1978 approx.    Social History   Socioeconomic History   Marital status: Single    Spouse name: Not on file   Number of children: 0   Years of education: 30   Highest education level: Master's degree (e.g., MA, MS, MEng, MEd, MSW, MBA)  Occupational History   Occupation: Retired    Comment: Psychotherapist  Tobacco Use   Smoking status: Former    Years: 30.00    Types: Cigarettes    Quit date: 09/07/1998    Years since quitting: 22.1   Smokeless tobacco: Never  Vaping Use  Vaping Use: Never used  Substance and Sexual Activity   Alcohol use: Not Currently    Comment: Occasionally   Drug use: No   Sexual activity: Yes    Birth control/protection: Post-menopausal  Other Topics Concern   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 3 caffeinated beverages daily.   Retired 03/2018      Social Determinants of Radio broadcast assistant Strain: Not on [employer] Insecurity: Not on file  Transportation Needs: Not on file  Physical Activity: Not on file  Stress: Not on file  Social Connections: Not  on file  Intimate Partner Violence: Not on file    Current Outpatient Medications on File Prior to Visit  Medication Sig Dispense Refill   amphetamine-dextroamphetamine (ADDERALL) 20 MG tablet Take 20 mg by mouth 2 (two) times daily.     blood glucose meter kit and supplies KIT Use to test blood sugar TID. dxE11.8 1 each 1   buPROPion (WELLBUTRIN XL) 150 MG 24 hr tablet TAKE 1 TABLET BY MOUTH EVERY MORNING 90 tablet 1   Calcium Carbonate-Vitamin D (CALCIUM + D PO) Take 1 tablet by mouth daily.      ferrous sulfate 324 (65 Fe) MG TBEC Take 1 tablet by mouth daily.      fish oil-omega-3 fatty acids 1000 MG capsule Take 1 g by mouth daily.      FLUoxetine (PROZAC) 40 MG capsule Take 80 mg by mouth daily.     glucose blood (ONETOUCH VERIO) test strip Use to check blood sugar twice a day DX:E11.8 200 each 1   ibandronate (BONIVA) 150 MG tablet Take 1 tablet (150 mg total) by mouth every 30 (thirty) days. See instructions. 3 tablet 3   metFORMIN (GLUCOPHAGE-XR) 500 MG 24 hr tablet Take 500 mg by mouth daily.     metroNIDAZOLE (METROGEL) 0.75 % gel Apply 1 application topically 2 (two) times daily. 45 g 5   omeprazole (PRILOSEC) 20 MG capsule Take 1 capsule (20 mg total) by mouth daily. 90 capsule 1   simvastatin (ZOCOR) 20 MG tablet SMARTSIG:1 Tablet(s) By Mouth Every Evening     simvastatin (ZOCOR) 40 MG tablet Take 1 tablet (40 mg total) by mouth at bedtime. 90 tablet 1   traZODone (DESYREL) 150 MG tablet Take 1 tablet (150 mg total) by mouth at bedtime. TAKE 1 TABLET BY MOUTH AT BEDTIME AS NEEDED FOR SLEEP 90 tablet 1   No current facility-administered medications on file prior to visit.    No Known Allergies  Family History  Problem Relation Age of Onset   Breast cancer Sister 74   Diabetes Sister    Hypertension Sister    Hyperlipidemia Sister    Breast cancer Sister 62   Heart disease Mother    Depression Mother    Osteoporosis Sister    Hypertension Father    Alcohol abuse  Father    Drug abuse Father    Meniere's disease Father    Hypertension Brother    Cancer Neg Hx    Early death Neg Hx    Hearing loss Neg Hx    Kidney disease Neg Hx    Learning disabilities Neg Hx    Stroke Neg Hx    Colon cancer Neg Hx     BP 120/62 (BP Location: Right Arm, Patient Position: Sitting, Cuff Size: Normal)   Pulse 82   Ht  _0  (1.575 m)   Wt 107 lb 6.4 oz (48.7 kg)   SpO2 96%   BMI 19.64 kg/m    Review of Systems Denies LOC    Objective:   Physical Exam VITAL SIGNS:  See vs page.   GENERAL: no distress.   GAIT: normal and steady.        Assessment & Plan:  Osteoporosis, due for recheck.  We discussed.  If BMD is still low, she favors [employer] over [employer].   Patient Instructions  Please call [phone] to schedule a Bone Density (DexaScan) a the [place] office at [place].   Please continue the same medications for now.   Please come back for a follow-up appointment in 6 months.

## 2020-10-29 NOTE — Patient Instructions (Addendum)
Please call [phone] to schedule a Bone Density (DexaScan) a the [place] office at [place].   Please continue the same medications for now.   Please come back for a follow-up appointment in 6 months.

## 2020-11-27 ENCOUNTER — Other Ambulatory Visit: Payer: Self-pay

## 2020-11-27 ENCOUNTER — Ambulatory Visit (INDEPENDENT_AMBULATORY_CARE_PROVIDER_SITE_OTHER)
Admission: RE | Admit: 2020-11-27 | Discharge: 2020-11-27 | Disposition: A | Discharge status: Home / Self Care | Payer: Medicare HMO | Source: Ambulatory Visit | Attending: Endocrinology | Admitting: Endocrinology

## 2020-11-27 DIAGNOSIS — M810 Age-related osteoporosis without current pathological fracture: Secondary | ICD-10-CM | POA: Diagnosis not present

## 2020-11-29 ENCOUNTER — Other Ambulatory Visit: Payer: Self-pay | Admitting: Endocrinology

## 2020-11-29 ENCOUNTER — Telehealth: Payer: Self-pay | Admitting: Endocrinology

## 2020-11-29 MED ORDER — EVENITY 105 MG/1.17ML ~~LOC~~ SOSY: 210.0000 mg | PREFILLED_SYRINGE | SUBCUTANEOUS | 11 refills | Status: DC

## 2020-11-29 NOTE — Telephone Encounter (Signed)
This in an in office procedure so it would require a PA for medical? We would not submit a PA.

## 2020-11-29 NOTE — Telephone Encounter (Signed)
-----   Message from Lauralyn Primes, [place] sent at 11/29/2020 12:05 PM EDT ----- Add it to the pt's med list. ----- Message ----- From: Renato Shin, MD Sent: 11/29/2020   7:43 AM EDT To: Lauralyn Primes, RMA  When you say prescribe it, what do you mean?  To a pharmacy?  ----- Message ----- From: Lauralyn Primes, [place] Sent: 11/29/2020   7:33 AM EDT To: Renato Shin, MD  We give injections here at the office. Similar to Prolia. You just prescribe it and we get a prior authorization completed. Once we have approval she schedules with the office a nurse visit. ----- Message ----- From: Renato Shin, MD Sent: 11/28/2020   5:45 PM EDT To: [facility]  Please call [facility].  How do I schedule Evenity?

## 2020-11-29 NOTE — Telephone Encounter (Signed)
Evenity VOB initiated via [email or web address]  Last OV:  Next OV:  Last Evenity inj: NEW START Next Evenity inj DUE:

## 2020-11-29 NOTE — Telephone Encounter (Signed)
I have added to med list.  Please do PA

## 2020-12-03 NOTE — Telephone Encounter (Signed)
Prior Auth required for [employer]  PA PROCESS DETAILS: PA is required. PA can be initiated by calling [phone] or online at [email or web address]

## 2020-12-09 NOTE — Telephone Encounter (Signed)
Prior Auth initiated for [employer] via [email or web address] Key: [id number]

## 2020-12-19 NOTE — Telephone Encounter (Addendum)
Pt ready for scheduling on or after 12/20/20  Out-of-pocket cost due at time of visit: $390  Primary: [employer] Evenity co-insurance: 20% (approximately $390) Admin fee co-insurance: 0%  Secondary: n/a Evenity co-insurance:  Admin fee co-insurance:   Deductible: does not apply  Prior Auth: APPROVED PA# [id number] Valid: 12/10/20-12/10/21  ** This summary of benefits is an estimation of the patient's out-of-pocket cost. Exact cost may vary based on individual plan coverage.

## 2021-01-09 NOTE — Telephone Encounter (Signed)
Hi Mardene Celeste,  Can you reach out to pt and try to get them scheduled for Evenity inj before the end of the year (if possible).  Thanks!

## 2021-02-05 NOTE — Telephone Encounter (Signed)
VOB initiated for 2023.

## 2021-03-03 NOTE — Telephone Encounter (Signed)
Pt ready for scheduling on or after 02/20/2021   Out-of-pocket cost due at time of visit: $443   Primary: [employer] Evenity co-insurance: 20% (approximately $390) Admin fee co-insurance: 20% (approximately $25)   Secondary: n/a Evenity co-insurance:  Admin fee co-insurance:    Deductible: does not apply   Prior Auth: APPROVED PA# [id number] Valid: 12/10/20-12/10/21   ** This summary of benefits is an estimation of the patient's out-of-pocket cost. Exact cost may vary based on individual plan coverage.

## 2021-03-24 ENCOUNTER — Encounter: Payer: Self-pay | Admitting: Neurology

## 2021-03-24 ENCOUNTER — Ambulatory Visit: Payer: Medicare HMO | Admitting: Neurology

## 2021-03-24 VITALS — BP 125/69 | HR 87 | Ht 62.0 in | Wt 102.5 lb

## 2021-03-24 DIAGNOSIS — F02A0 Dementia in other diseases classified elsewhere, mild, without behavioral disturbance, psychotic disturbance, mood disturbance, and anxiety: Secondary | ICD-10-CM

## 2021-03-24 DIAGNOSIS — G301 Alzheimer's disease with late onset: Secondary | ICD-10-CM | POA: Diagnosis not present

## 2021-03-24 MED ORDER — DONEPEZIL HCL 10 MG PO TABS: 10.0000 mg | ORAL_TABLET | Freq: Every day | ORAL | 3 refills | Status: AC

## 2021-03-24 NOTE — Patient Instructions (Addendum)
Start Aricept, 1/2 tablet for 2 weeks then increase to full tab  Continue your other medications  Return in 1 year     There are well-accepted and sensible ways to reduce risk for Alzheimers disease and other degenerative brain disorders .  Exercise Daily Walk A daily 20 minute walk should be part of your routine. Disease related apathy can be a significant roadblock to exercise and the only way to overcome this is to make it a daily routine and perhaps have a reward at the end (something your loved one loves to eat or drink perhaps) or a personal trainer coming to the home can also be very useful. Most importantly, the patient is much more likely to exercise if the caregiver / spouse does it with him/her. In general a structured, repetitive schedule is best.  General Health: Any diseases which effect your body will effect your brain such as a pneumonia, urinary infection, blood clot, heart attack or stroke. Keep contact with your primary care doctor for regular follow ups.  Sleep. A good nights sleep is healthy for the brain. Seven hours is recommended. If you have insomnia or poor sleep habits we can give you some instructions. If you have sleep apnea wear your mask.  Diet: Eating a heart healthy diet is also a good idea; fish and poultry instead of red meat, nuts (mostly non-peanuts), vegetables, fruits, olive oil or canola oil (instead of butter), minimal salt (use other spices to flavor foods), whole grain rice, bread, cereal and pasta and wine in moderation.Research is now showing that the MIND diet, which is a combination of The Mediterranean diet and the DASH diet, is beneficial for cognitive processing and longevity. Information about this diet can be found in The MIND Diet, a book by Doyne Keel, MS, RDN, and online at [employer]bookDistributors.[email or web address]  Finances, Power of Attorney and Advance Directives: You should consider putting legal safeguards in place with regard to  financial and medical decision making. While the spouse always has power of attorney for medical and financial issues in the absence of any form, you should consider what you want in case the spouse / caregiver is no longer around or capable of making decisions.     Heart-head connection  New research shows there are things we can do to reduce the risk of mild cognitive impairment and dementia.  Several conditions known to increase the risk of cardiovascular disease -- such as high blood pressure, diabetes and high cholesterol -- also increase the risk of developing Alzheimer's. Some autopsy studies show that as many as 69 percent of individuals with Alzheimer's disease also have cardiovascular disease.  A longstanding question is why some people develop hallmark Alzheimer's plaques and tangles but do not develop the symptoms of Alzheimer's. Vascular disease may help researchers eventually find an answer. Some autopsy studies suggest that plaques and tangles may be present in the brain without causing symptoms of cognitive decline unless the brain also shows evidence of vascular disease. More research is needed to better understand the link between vascular health and Alzheimer's.  Physical exercise and diet Regular physical exercise may be a beneficial strategy to lower the risk of Alzheimer's and vascular dementia. Exercise may directly benefit brain cells by increasing blood and oxygen flow in the brain. Because of its known cardiovascular benefits, a medically approved exercise program is a valuable part of any overall wellness plan.  Current evidence suggests that heart-healthy eating may also help protect the brain. Heart-healthy eating includes limiting  the intake of sugar and saturated fats and making sure to eat plenty of fruits, vegetables, and whole grains. No one diet is best. Two diets that have been studied and may be beneficial are the DASH (Dietary Approaches to Stop Hypertension) diet  and the Mediterranean diet. The DASH diet emphasizes vegetables, fruits and fat-free or low-fat dairy products; includes whole grains, fish, poultry, beans, seeds, nuts and vegetable oils; and limits sodium, sweets, sugary beverages and red meats. A Mediterranean diet includes relatively little red meat and emphasizes whole grains, fruits and vegetables, fish and shellfish, and nuts, olive oil and other healthy fats.  Social connections and intellectual activity A number of studies indicate that maintaining strong social connections and keeping mentally active as we age might lower the risk of cognitive decline and Alzheimer's. Experts are not certain about the reason for this association. It may be due to direct mechanisms through which social and mental stimulation strengthen connections between nerve cells in the brain.  Head trauma There appears to be a strong link between future risk of Alzheimer's and serious head trauma, especially when injury involves loss of consciousness. You can help reduce your risk of Alzheimer's by protecting your head.  Wear a seat belt  Use a helmet when participating in sports  "Fall-proof" your home   What you can do now While research is not yet conclusive, certain lifestyle choices, such as physical activity and diet, may help support brain health and prevent Alzheimer's. Many of these lifestyle changes have been shown to lower the risk of other diseases, like heart disease and diabetes, which have been linked to Alzheimer's. With few drawbacks and plenty of known benefits, healthy lifestyle choices can improve your health and possibly protect your brain.  Learn more about brain health. You can help increase our knowledge by considering participation in a clinical study. Our free clinical trial matching services, TrialMatch, can help you find clinical trials in your area that are seeking volunteers.

## 2021-03-24 NOTE — Progress Notes (Signed)
[facility]  PATIENT: Jill Chandler DOB: [birth year]  REQUESTING CLINICIAN: Bernerd Limbo, MD HISTORY FROM: Patient  REASON FOR VISIT: memory decline    HISTORICAL  CHIEF COMPLAINT:  Chief Complaint  Patient presents with   Consult    Room 12 - alone. She is under our care for cognitive decline. Feels memory is much worse. Extreme forgetfulness. Transitioning from Dr. Jannifer Chandler.     HISTORY OF PRESENT ILLNESS:  From Dr Jill Chandler 07/04/2019 Jill Chandler is a 76 year old right-handed white female with a history mild cognitive impairment.  The patient does have a prior history of depression and ADD.  She currently is on 3 different antidepressant medications and she takes Adderall.  The patient currently is not working, she lost her job with the Covid pandemic and has never returned.  Over the last 18 months she has noted some increased problems with cognitive decline.  She is having increasing difficulty using technology such as a computer or cell phone, she has difficulty with problem-solving and difficulty with short-term memory.  She will misplace things about the house frequently.  She is still operating a motor vehicle without difficulty, she is able to manage her medications and appointments and do her finances.  She has not given up any activities of daily living because of memory.  She claims that she is sleeping fairly well, she has a good energy level during the day.  She has also noted some changes in balance, she has fallen on several occasions, the last fall was several months ago.  The patient has injured her right shoulder but has not yet seen an orthopedic surgeon for this.  The patient denies any numbness in the feet or burning or stinging in the feet.  She comes to this office for further evaluation.   INTERVAL HISTORY 03/24/21 Patient presents today for follow-up, last seen was in June 2021 by Dr. Jannifer Chandler.  At that time she was diagnosed with cognitive impairment but  not started on any medication.  Patient reported she lives alone, and she has not been doing well in terms of her memory. She is more forgetful, she is getting lost in familiar places, reported on today's visit she got lost coming here and she could not remember her appointment time, and also she could not find the keys.  She reported her symptoms are getting worse in the past 6 months.  Last month, she lost her car in the parking lot.  She lives alone, reported she is a vegetarian does not do a lot of cooking.  Most of her bills are paid automatically, she it takes a lot of time to perform activities like cleaning.   TBI:  No past history of TBI Stroke:  no past history of stroke Seizures:  no past history of seizures Sleep:  no history of sleep apnea.  Mood:  patient denies anxiety and depression  Functional status: independent in all  ADLs Patient lives with alone  Cooking: Yes, she is vegetarian, simple meal plan  Cleaning: Yes but with difficulty  Shopping: Ok Bathing: patient  Toileting: patient  Driving: yes but getting lost a lot  Bills: No Ever left the stove on by accident?: not using the stove  Forget how to use items around the house?: yes  Getting lost going to familiar places?: yes  Forgetting loved ones names?: Yes sometimes  Word finding difficulty? Yes  Sleep: gets about 6 hrs per night    OTHER MEDICAL CONDITIONS: ADD, Depression,  Diabetes, Hyperlipidemia    REVIEW OF SYSTEMS: Full 14 system review of systems performed and negative with exception of: as noted in the HPI   ALLERGIES: No Known Allergies  HOME MEDICATIONS: Outpatient Medications Prior to Visit  Medication Sig Dispense Refill   amphetamine-dextroamphetamine (ADDERALL) 20 MG tablet Take 20 mg by mouth 2 (two) times daily.     blood glucose meter kit and supplies KIT Use to test blood sugar TID. dxE11.8 1 each 1   buPROPion (WELLBUTRIN XL) 150 MG 24 hr tablet TAKE 1 TABLET BY MOUTH EVERY MORNING 90  tablet 1   Calcium Carbonate-Vitamin D (CALCIUM + D PO) Take 1 tablet by mouth daily.      ferrous sulfate 324 (65 Fe) MG TBEC Take 1 tablet by mouth daily.      fish oil-omega-3 fatty acids 1000 MG capsule Take 1 g by mouth daily.      FLUoxetine (PROZAC) 40 MG capsule Take 80 mg by mouth daily.     glucose blood (ONETOUCH VERIO) test strip Use to check blood sugar twice a day DX:E11.8 200 each 1   ibandronate (BONIVA) 150 MG tablet Take 1 tablet (150 mg total) by mouth every 30 (thirty) days. See instructions. 3 tablet 3   metFORMIN (GLUCOPHAGE-XR) 500 MG 24 hr tablet Take 500 mg by mouth daily.     metroNIDAZOLE (METROGEL) 0.75 % gel Apply 1 application topically 2 (two) times daily. 45 g 5   omeprazole (PRILOSEC) 20 MG capsule Take 1 capsule (20 mg total) by mouth daily. 90 capsule 1   simvastatin (ZOCOR) 40 MG tablet Take 1 tablet (40 mg total) by mouth at bedtime. 90 tablet 1   traZODone (DESYREL) 150 MG tablet Take 1 tablet (150 mg total) by mouth at bedtime. TAKE 1 TABLET BY MOUTH AT BEDTIME AS NEEDED FOR SLEEP 90 tablet 1   Romosozumab-aqqg (EVENITY) 105 MG/1.17ML SOSY injection Inject 210 mg into the skin every 30 (thirty) days. 2.4 mL 11   simvastatin (ZOCOR) 20 MG tablet SMARTSIG:1 Tablet(s) By Mouth Every Evening     No facility-administered medications prior to visit.    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  s/p  partial vulvectomy--- per pt no issue since   Folic acid deficiency    Generalized anxiety disorder    GERD (gastroesophageal reflux disease)    History of adenomatous polyp of colon    TUBULAR ADENOM'S   History of cervical dysplasia    History of ovarian cystectomy    approx. 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   Uterine fibroid     PAST SURGICAL HISTORY: Past Surgical History:  Procedure Laterality Date   ANTERIOR CERVICAL DECOMP/DISCECTOMY FUSION  11-12-2005  dr Vertell Limber   C3--4   BREAST REDUCTION SURGERY Bilateral 11-24-2004   dr Georgia Lopes   CATARACT EXTRACTION W/ INTRAOCULAR LENS  IMPLANT, BILATERAL  early 2000s approx.   COLONOSCOPY  last one 02-24-2013   ESOPHAGOGASTRODUODENOSCOPY  12/02/2015   LAPAROSCOPIC BILATERAL SALPINGO OOPHERECTOMY Bilateral 10/06/2016   Procedure: LAPAROSCOPIC BILATERAL SALPINGO OOPHORECTOMY;  Surgeon: Paula Compton, MD;  Location: [facility];  Service: Gynecology;  Laterality: Bilateral;   LEEP  YRS AGO   cervical dysplasia   OVARIAN CYST REMOVAL  1981 approx.   TRANSTHORACIC ECHOCARDIOGRAM  04/11/2008   ef 10-62%, grade 1 diastolic dysfunction   VULVECTOMY PARTIAL  1978 approx.    FAMILY HISTORY: Family History  Problem Relation Age of Onset   Breast cancer Sister 23   Diabetes Sister    Hypertension Sister    Hyperlipidemia Sister    Breast cancer Sister 56   Heart disease Mother    Depression Mother    Osteoporosis Sister    Hypertension Father    Alcohol abuse Father    Drug abuse Father    Meniere's disease Father    Hypertension Brother     Cancer Neg Hx    Early death Neg Hx    Hearing loss Neg Hx    Kidney disease Neg Hx    Learning disabilities Neg Hx    Stroke Neg Hx    Colon cancer Neg Hx     SOCIAL HISTORY: Social History   Socioeconomic History   Marital status: Single    Spouse name: Not on file   Number of children: 0   Years of education: 18   Highest education level: Master's degree (e.g., MA, MS, MEng, MEd, MSW, MBA)  Occupational History   Occupation: Retired    Comment: Psychotherapist  Tobacco Use   Smoking status: Former    Years: 30.00    Types: Cigarettes    Quit date: 09/07/1998    Years since quitting: 22.5   Smokeless tobacco: Never  Vaping Use   Vaping Use: Never used  Substance and Sexual Activity   Alcohol use: Not Currently    Comment: Occasionally   Drug use: No   Sexual activity: Yes    Birth control/protection: Post-menopausal  Other Topics Concern   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 3 caffeinated beverages daily.   Retired 03/2018   Lives alone.   Right-handed.   Caffeine use: one cup daily.      Social Determinants of Health   Financial Resource Strain: Not on file  Food Insecurity: Not on file  Transportation Needs: Not on file  Physical Activity: Not on file  Stress: Not on file  Social Connections: Not on file  Intimate Partner Violence: Not on file    PHYSICAL EXAM  GENERAL EXAM/CONSTITUTIONAL: Vitals:  Vitals:   03/24/21 1452  BP: 125/69  Pulse: 87  Weight: 102 lb 8 oz (46.5 kg)  Height: _0  (1.575 m)   Body mass index is 18.75 kg/m. Wt Readings from Last 3 Encounters:  03/24/21 102 lb 8 oz (46.5 kg)  10/29/20 107 lb 6.4 oz (48.7 kg)  07/04/19 121 lb (  54.9 kg)   Patient is in no distress; well developed, nourished and groomed; neck is supple  CARDIOVASCULAR: Examination of carotid arteries is normal; no carotid bruits Regular rate and rhythm, no  murmurs Examination of peripheral vascular system by observation and palpation is normal  EYES: Pupils round and reactive to light, Visual fields full to confrontation, Extraocular movements intacts,   MUSCULOSKELETAL: Gait, strength, tone, movements noted in Neurologic exam below  NEUROLOGIC: MENTAL STATUS:  MMSE - [place] Exam 07/04/2019 12/14/2017 05/31/2017  Orientation to time _0 Orientation to Place _1 Registration _2 Attention/ Calculation _3 Recall _4 Language- name 2 objects _5 Language- repeat _6 Language- follow 3 step command _7 Language- read & follow direction _8 Write a sentence 1 1 0  Copy design 1 1 0  Total score _9 awake, alert, oriented to person, place and time normal attention and concentration language fluent, comprehension intact, naming intact fund of knowledge appropriate  Montreal Cognitive Assessment  03/24/2021  Visuospatial/ Executive (0/5) 5  Naming (0/3) 3  Attention: Read list of digits (0/2) 2  Attention: Read list of letters (0/1) 1  Attention: Serial 7 subtraction starting at 100 (0/3) 3  Language: Repeat phrase (0/2) 2  Language : Fluency (0/1) 1  Abstraction (0/2) 2  Delayed Recall (0/5) 0  Orientation (0/6) 6  Total 25  Adjusted Score (based on education) 25    CRANIAL NERVE:  2nd, 3rd, 4th, 6th - pupils equal and reactive to light, visual fields full to confrontation, extraocular muscles intact, no nystagmus 5th - facial sensation symmetric 7th - facial strength symmetric 8th - hearing intact 9th - palate elevates symmetrically, uvula midline 11th - shoulder shrug symmetric 12th - tongue protrusion midline  MOTOR:  normal bulk and tone, full strength in the BUE, BLE  SENSORY:  normal and symmetric to light touch, vibration  COORDINATION:  finger-nose-finger, fine finger movements normal  REFLEXES:  deep tendon reflexes present and symmetric  GAIT/STATION:   normal   DIAGNOSTIC DATA (LABS, IMAGING, TESTING) - I reviewed patient records, labs, notes, testing and imaging myself where available.  Lab Results  Component Value Date   WBC 4.5 03/21/2017   HGB 12.9 03/21/2017   HCT 38.5 03/21/2017   MCV 81.9 03/21/2017   PLT 201 03/21/2017      Component Value Date/Time   NA 138 04/23/2020 1533   K 4.2 04/23/2020 1533   CL 101 04/23/2020 1533   CO2 28 04/23/2020 1533   GLUCOSE 142 (H) 04/23/2020 1533   BUN 19 04/23/2020 1533   CREATININE 0.78 04/23/2020 1533   CALCIUM 10.1 04/23/2020 1533   CALCIUM 10.0 04/23/2020 1533   PROT 7.0 03/26/2017 1448   ALBUMIN 3.7 03/26/2017 1448   AST 16 03/26/2017 1448   ALT 21 03/26/2017 1448   ALKPHOS 62 03/26/2017 1448   BILITOT 0.2 03/26/2017 1448   GFRNONAA >60 03/21/2017 1617   GFRAA >60 03/21/2017 1617   Lab Results  Component Value Date   CHOL 134 09/21/2016   HDL 48.70 09/21/2016   LDLCALC 60 09/21/2016   LDLDIRECT 72.0 11/05/2015   TRIG 128.0 09/21/2016   CHOLHDL 3 09/21/2016   Lab Results  Component Value Date   HGBA1C 6.1 (A) 04/23/2020   Lab Results  Component Value Date   VITAMINB12 1,511 (H)  07/04/2019   Lab Results  Component Value Date   TSH 1.98 04/23/2020    MRI Brain 05/2019 No acute or reversible finding. Age related volume loss. Mild to moderate chronic small-vessel ischemic changes of the cerebral hemispheric white matter.   ASSESSMENT AND PLAN  76 y.o. year old female with ADD, Depression, Diabetes, Hyperlipidemia and dementia who is presenting for follow-up.  She reports trouble of activities of daily living including difficulty performing task, getting lost in familiar places, and being more forgetful.  She also lives alone.  On today's exam she scored 25 out of 30 on the [place] which is consistent with mild impairment.  I have told patient based on the history, I think that she has mild dementia, and I will start her on Aricept 10 mg nightly, initially half  tablet for 2 weeks then increase it to full tab thereafter.  She did have a full neuropsych testing in July 2021 which was consistent with mild neurocognitive disorder.  Also discussed additional ways to reduce the progression of her disease including exercise, good health, good diet and good sleep.  I will see her in 1 year for follow-up.   1. Mild late onset Alzheimer's dementia without behavioral disturbance, psychotic disturbance, mood disturbance, or anxiety ([place])      Patient Instructions  Start Aricept, 1/2 tablet for 2 weeks then increase to full tab  Continue your other medications  Return in 1 year     There are well-accepted and sensible ways to reduce risk for Alzheimers disease and other degenerative brain disorders .  Exercise Daily Walk A daily 20 minute walk should be part of your routine. Disease related apathy can be a significant roadblock to exercise and the only way to overcome this is to make it a daily routine and perhaps have a reward at the end (something your loved one loves to eat or drink perhaps) or a personal trainer coming to the home can also be very useful. Most importantly, the patient is much more likely to exercise if the caregiver / spouse does it with him/her. In general a structured, repetitive schedule is best.  General Health: Any diseases which effect your body will effect your brain such as a pneumonia, urinary infection, blood clot, heart attack or stroke. Keep contact with your primary care doctor for regular follow ups.  Sleep. A good nights sleep is healthy for the brain. Seven hours is recommended. If you have insomnia or poor sleep habits we can give you some instructions. If you have sleep apnea wear your mask.  Diet: Eating a heart healthy diet is also a good idea; fish and poultry instead of red meat, nuts (mostly non-peanuts), vegetables, fruits, olive oil or canola oil (instead of butter), minimal salt (use other spices to flavor foods),  whole grain rice, bread, cereal and pasta and wine in moderation.Research is now showing that the MIND diet, which is a combination of The Mediterranean diet and the DASH diet, is beneficial for cognitive processing and longevity. Information about this diet can be found in The MIND Diet, a book by Doyne Keel, MS, RDN, and online at [employer]book[employer].si  Finances, Power of Attorney and Advance Directives: You should consider putting legal safeguards in place with regard to financial and medical decision making. While the spouse always has power of attorney for medical and financial issues in the absence of any form, you should consider what you want in case the spouse / caregiver is no longer around or  capable of making decisions.     Heart-head connection  New research shows there are things we can do to reduce the risk of mild cognitive impairment and dementia.  Several conditions known to increase the risk of cardiovascular disease -- such as high blood pressure, diabetes and high cholesterol -- also increase the risk of developing Alzheimer's. Some autopsy studies show that as many as 43 percent of individuals with Alzheimer's disease also have cardiovascular disease.  A longstanding question is why some people develop hallmark Alzheimer's plaques and tangles but do not develop the symptoms of Alzheimer's. Vascular disease may help researchers eventually find an answer. Some autopsy studies suggest that plaques and tangles may be present in the brain without causing symptoms of cognitive decline unless the brain also shows evidence of vascular disease. More research is needed to better understand the link between vascular health and Alzheimer's.  Physical exercise and diet Regular physical exercise may be a beneficial strategy to lower the risk of Alzheimer's and vascular dementia. Exercise may directly benefit brain cells by increasing blood and oxygen flow in the  brain. Because of its known cardiovascular benefits, a medically approved exercise program is a valuable part of any overall wellness plan.  Current evidence suggests that heart-healthy eating may also help protect the brain. Heart-healthy eating includes limiting the intake of sugar and saturated fats and making sure to eat plenty of fruits, vegetables, and whole grains. No one diet is best. Two diets that have been studied and may be beneficial are the DASH (Dietary Approaches to Stop Hypertension) diet and the Mediterranean diet. The DASH diet emphasizes vegetables, fruits and fat-free or low-fat dairy products; includes whole grains, fish, poultry, beans, seeds, nuts and vegetable oils; and limits sodium, sweets, sugary beverages and red meats. A Mediterranean diet includes relatively little red meat and emphasizes whole grains, fruits and vegetables, fish and shellfish, and nuts, olive oil and other healthy fats.  Social connections and intellectual activity A number of studies indicate that maintaining strong social connections and keeping mentally active as we age might lower the risk of cognitive decline and Alzheimer's. Experts are not certain about the reason for this association. It may be due to direct mechanisms through which social and mental stimulation strengthen connections between nerve cells in the brain.  Head trauma There appears to be a strong link between future risk of Alzheimer's and serious head trauma, especially when injury involves loss of consciousness. You can help reduce your risk of Alzheimer's by protecting your head.  Wear a seat belt  Use a helmet when participating in sports  "Fall-proof" your home   What you can do now While research is not yet conclusive, certain lifestyle choices, such as physical activity and diet, may help support brain health and prevent Alzheimer's. Many of these lifestyle changes have been shown to lower the risk of other diseases, like  heart disease and diabetes, which have been linked to Alzheimer's. With few drawbacks and plenty of known benefits, healthy lifestyle choices can improve your health and possibly protect your brain.  Learn more about brain health. You can help increase our knowledge by considering participation in a clinical study. Our free clinical trial matching services, TrialMatch, can help you find clinical trials in your area that are seeking volunteers.     No orders of the defined types were placed in this encounter.   Meds ordered this encounter  Medications   donepezil (ARICEPT) 10 MG tablet    Sig:  Take 1 tablet (10 mg total) by mouth at bedtime.    Dispense:  90 tablet    Refill:  3    Return in about 1 year (around 03/24/2022).  I have spent a total of 50 minutes dedicated to this patient today, preparing to see patient, performing a medically appropriate examination and evaluation, ordering tests and/or medications and procedures, and counseling and educating the patient/family/caregiver; independently interpreting result and communicating results to the family/patient/caregiver; and documenting clinical information in the electronic medical record.   Alric Ran, MD 03/24/2021, 3:45 PM  [facility] [address] [place], [place] [address] [phone]

## 2021-05-13 ENCOUNTER — Ambulatory Visit: Payer: Medicare HMO | Admitting: Endocrinology

## 2021-05-13 NOTE — Telephone Encounter (Signed)
Hi Dr. Loanne Drilling,  ?FYI pt has not yet started on Evenity for treatment of osteoporosis.  ? ?Please let me know if treatment plan has changed so that I can archive pt in [facility] portal.  ?

## 2021-05-14 NOTE — Telephone Encounter (Signed)
MyChart message now sent ?

## 2021-06-16 NOTE — Telephone Encounter (Signed)
Pt archived in [email or web address].  Please advise if patient and/or provider wish to proceed with EVENITY therapy.

## 2021-06-28 ENCOUNTER — Encounter: Payer: Self-pay | Admitting: Neurology

## 2021-12-01 ENCOUNTER — Other Ambulatory Visit (INDEPENDENT_AMBULATORY_CARE_PROVIDER_SITE_OTHER): Payer: Medicare PPO

## 2021-12-01 ENCOUNTER — Ambulatory Visit (INDEPENDENT_AMBULATORY_CARE_PROVIDER_SITE_OTHER): Payer: Medicare PPO | Admitting: Physician Assistant

## 2021-12-01 ENCOUNTER — Encounter: Payer: Self-pay | Admitting: Physician Assistant

## 2021-12-01 VITALS — BP 122/64 | HR 82 | Ht 62.0 in | Wt 95.0 lb

## 2021-12-01 DIAGNOSIS — R112 Nausea with vomiting, unspecified: Secondary | ICD-10-CM | POA: Diagnosis not present

## 2021-12-01 DIAGNOSIS — Z8379 Family history of other diseases of the digestive system: Secondary | ICD-10-CM

## 2021-12-01 DIAGNOSIS — R634 Abnormal weight loss: Secondary | ICD-10-CM | POA: Diagnosis not present

## 2021-12-01 LAB — CBC WITH DIFFERENTIAL/PLATELET
Basophils Absolute: 0 10*3/uL (ref 0.0–0.1)
Basophils Relative: 0.7 % (ref 0.0–3.0)
Eosinophils Absolute: 0.1 10*3/uL (ref 0.0–0.7)
Eosinophils Relative: 1.6 % (ref 0.0–5.0)
HCT: 37.7 % (ref 36.0–46.0)
Hemoglobin: 12.3 g/dL (ref 12.0–15.0)
Lymphocytes Relative: 18.7 % (ref 12.0–46.0)
Lymphs Abs: 1.2 10*3/uL (ref 0.7–4.0)
MCHC: 32.6 g/dL (ref 30.0–36.0)
MCV: 81.4 fl (ref 78.0–100.0)
Monocytes Absolute: 0.7 10*3/uL (ref 0.1–1.0)
Monocytes Relative: 10.2 % (ref 3.0–12.0)
Neutro Abs: 4.6 10*3/uL (ref 1.4–7.7)
Neutrophils Relative %: 68.8 % (ref 43.0–77.0)
Platelets: 247 10*3/uL (ref 150.0–400.0)
RBC: 4.63 Mil/uL (ref 3.87–5.11)
RDW: 15 % (ref 11.5–15.5)
WBC: 6.7 10*3/uL (ref 4.0–10.5)

## 2021-12-01 LAB — SEDIMENTATION RATE: Sed Rate: 13 mm/hr (ref 0–30)

## 2021-12-01 LAB — BASIC METABOLIC PANEL
BUN: 12 mg/dL (ref 6–23)
CO2: 29 mEq/L (ref 19–32)
Calcium: 10.1 mg/dL (ref 8.4–10.5)
Chloride: 102 mEq/L (ref 96–112)
Creatinine, Ser: 0.71 mg/dL (ref 0.40–1.20)
GFR: 83.88 mL/min (ref >60.00)
Glucose, Bld: 76 mg/dL (ref 70–99)
Potassium: 3.8 mEq/L (ref 3.5–5.1)
Sodium: 139 mEq/L (ref 135–145)

## 2021-12-01 LAB — TSH: TSH: 1.99 u[IU]/mL (ref 0.35–5.50)

## 2021-12-01 LAB — C-REACTIVE PROTEIN: CRP: <1 mg/dL (ref 0.5–20.0)

## 2021-12-01 NOTE — Patient Instructions (Signed)
_______________________________________________________  If you are age 74 or older, your body mass index should be between 23-30. Your Body mass index is 17.38 kg/m. If this is out of the aforementioned range listed, please consider follow up with your Primary Care Provider.  If you are age 57 or younger, your body mass index should be between 19-25. Your Body mass index is 17.38 kg/m. If this is out of the aformentioned range listed, please consider follow up with your Primary Care Provider.   Your provider has requested that you go to the basement level for lab work before leaving today. Press "B" on the elevator. The lab is located at the first door on the left as you exit the elevator.   Continue Omeprazole 20 mg in the morning  You have been scheduled for a CT scan of the abdomen and pelvis at [facility], 1st floor Radiology. You are scheduled on 11/30/21 at 2:30pm.  Please follow the written instructions below on the day of your exam:   1) Do not eat anything after 12:30pm (4 hours prior to your test)   You may take any medications as prescribed with a small amount of water, if necessary. If you take any of the following medications: METFORMIN, GLUCOPHAGE, GLUCOVANCE, AVANDAMET, RIOMET, FORTAMET, [place] MET, JANUMET, GLUMETZA or METAGLIP, you MAY be asked to HOLD this medication 48 hours AFTER the exam.   The purpose of you drinking the oral contrast is to aid in the visualization of your intestinal tract. The contrast solution may cause some diarrhea. Depending on your individual set of symptoms, you may also receive an intravenous injection of x-ray contrast/dye. Plan on being at [facility] for 45 minutes or longer, depending on the type of exam you are having performed.   If you have any questions regarding your exam or if you need to reschedule, you may call [facility] at [phone] between the hours of 8:00 am and 5:00 pm, Monday-Friday.    The [facility]  providers would like to encourage you to use [facility] to communicate with providers for non-urgent requests or questions.  Due to long hold times on the telephone, sending your provider a message by [facility] may be a faster and more efficient way to get a response.  Please allow 48 business hours for a response.  Please remember that this is for non-urgent requests.   It was a pleasure to see you today!  Thank you for trusting me with your gastrointestinal care!

## 2021-12-01 NOTE — Progress Notes (Signed)
Subjective:    Patient ID: Jill Chandler, female    DOB: 1945-11-22, 76 y.o.   MRN: [id number]  HPI  Jill Chandler is a pleasant 76 year old white female, established with Dr. Carlean Purl, last seen here in 2020 who comes in today with several month history of poor appetite, weight loss of about 20 pounds over the past 8 months, and frequent nausea and vomiting..  She has history of short segment Barrett's esophagus and adenomatous colon polyps, adult onset diabetes mellitus and mild neurocognitive disorder as well as depression/anxiety.  She says that her symptoms started around April 2023 generally just not feeling well, fatigue, spending a lot of time in bed and with no appetite.  After that she started losing weight and then started having problems with frequent nausea and vomiting about 4 months ago.  She denies any heartburn or indigestion, no dysphagia or odynophagia, no abdominal pain.  She says she would just get nauseated frequently and very quickly vomit.  This would occur at all different times of the day, sometimes when she was out shopping etc.  Again she had gradual weight loss of about 20 pounds. She did have upper abdominal ultrasound done by PCP in August 2023 which was negative CBC and c-Met unrevealing August 2023. Prescription for omeprazole but has not been taking that regularly as she did not feel that it was helping any of her symptoms.  She had also been given a prescription for Zofran but says that was not helpful at all for her nausea and vomiting. Fortunately over the past couple of weeks she has been feeling significantly better, still having occasional nausea and vomiting but not occurring on a regular basis and says she has not vomited for the past week and a half.  She has gained about 4 pounds by her scale at home and overall feeling better. She had not been started on any new medications or supplements around the time that symptoms had occurred.  Last EGD November 2017 with a 5  cm hiatal hernia and short segment Barrett's, biopsies showed Barrett's without dysplasia and follow-up in 3 years recommended Last colonoscopy 2015 with 2 diminutive sessile polyps and severe sigmoid diverticulosis.  Biopsies showed 1 tubular adenoma and 1 hyperplastic polyp, 5-year interval follow-up recommended.  Review of Systems Pertinent positive and negative review of systems were noted in the above HPI section.  All other review of systems was otherwise negative.   Outpatient Encounter Medications as of 12/01/2021  Medication Sig   amphetamine-dextroamphetamine (ADDERALL) 20 MG tablet Take 20 mg by mouth 2 (two) times daily.   blood glucose meter kit and supplies KIT Use to test blood sugar TID. dxE11.8   buPROPion (WELLBUTRIN XL) 150 MG 24 hr tablet TAKE 1 TABLET BY MOUTH EVERY MORNING   Calcium Carbonate-Vitamin D (CALCIUM + D PO) Take 1 tablet by mouth daily.    ferrous sulfate 324 (65 Fe) MG TBEC Take 1 tablet by mouth daily.    fish oil-omega-3 fatty acids 1000 MG capsule Take 1 g by mouth daily.    FLUoxetine (PROZAC) 40 MG capsule Take 80 mg by mouth daily.   glucose blood (ONETOUCH VERIO) test strip Use to check blood sugar twice a day DX:E11.8   ibandronate (BONIVA) 150 MG tablet Take 1 tablet (150 mg total) by mouth every 30 (thirty) days. See instructions.   metFORMIN (GLUCOPHAGE-XR) 500 MG 24 hr tablet Take 500 mg by mouth daily.   metroNIDAZOLE (METROGEL) 0.75 % gel Apply 1  application topically 2 (two) times daily.   omeprazole (PRILOSEC) 20 MG capsule Take 1 capsule (20 mg total) by mouth daily.   simvastatin (ZOCOR) 40 MG tablet Take 1 tablet (40 mg total) by mouth at bedtime.   traZODone (DESYREL) 150 MG tablet Take 1 tablet (150 mg total) by mouth at bedtime. TAKE 1 TABLET BY MOUTH AT BEDTIME AS NEEDED FOR SLEEP   donepezil (ARICEPT) 10 MG tablet Take 1 tablet (10 mg total) by mouth at bedtime.   No facility-administered encounter medications on file as of 12/01/2021.    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   Mild intermittent asthma without complication 26/94/8546   Rotator cuff arthropathy 08/12/2015   Barrett's esophagus 03/01/2013   Osteoporosis, senile 11/30/2012   Major depressive disorder 10/31/2012   ADHD (attention deficit hyperactivity disorder) 10/31/2012   Type II diabetes mellitus with manifestations 04/10/2011   Nonspecific abnormal electrocardiogram (ECG) (EKG) 03/12/2011   Hyperlipidemia 03/08/2009   Iron deficiency anemia 03/08/2009   GERD (gastroesophageal reflux disease) 03/08/2009   Social History   Socioeconomic History   Marital status: Single    Spouse name: Not on file   Number of children: 0   Years of education: 18   Highest education level: Master's degree (e.g., MA, MS, MEng, MEd, MSW, MBA)  Occupational History   Occupation: Retired    Comment: Psychotherapist  Tobacco Use   Smoking status: Former    Years: 30.00    Types: Cigarettes    Quit date: 09/07/1998    Years since quitting: 23.2   Smokeless tobacco: Never  Vaping Use   Vaping Use: Never used  Substance and Sexual Activity   Alcohol use: Not Currently    Comment: Occasionally   Drug use: No   Sexual activity: Yes    Birth control/protection: Post-menopausal  Other Topics Concern   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 3 caffeinated beverages daily.    Retired 03/2018   Lives alone.   Right-handed.   Caffeine use: one cup daily.      Social Determinants of Health   Financial Resource Strain: Not on file  Food Insecurity: Not on file  Transportation Needs: Not on file  Physical Activity: Not on file  Stress: Not on file  Social Connections: Not on file  Intimate Partner Violence: Not on file    Ms. Sivils family history includes Alcohol abuse in her father; Breast cancer (age of onset: 39) in her sister and sister; Depression in her mother; Diabetes in her sister; Drug abuse in her father; Heart disease in her mother; Hyperlipidemia in her sister; Hypertension in her brother, father, and sister; Meniere's disease in her father; Osteoporosis in her sister.      Objective:    Vitals:   12/01/21 1458  BP: 122/64  Pulse: 82    Physical Exam Well-developed well-nourished thin frail appearing elderly white female in  no acute distress.  Height, Weight, 95 BMI 17.3  HEENT; nontraumatic normocephalic, EOMI, PE R LA, sclera anicteric. Oropharynx; not examined today Neck; supple, no JVD Cardiovascular; regular rate and rhythm with S1-S2, no murmur rub or gallop Pulmonary; Clear bilaterally Abdomen; soft, nontender, nondistended, no palpable mass or hepatosplenomegaly, bowel sounds are active, no succussion splash Rectal; not done today Skin; benign exam, no jaundice rash or appreciable lesions Extremities; no clubbing cyanosis or edema skin warm and dry Neuro/Psych; alert and oriented x4, grossly nonfocal mood and affect appropriate        Assessment & Plan:   #86 76 year old   female with multiple month history of poor appetite, and weight loss of approximately 20 pounds over 8 months, then onset of frequent nausea and vomiting 3 to 4 months ago. Symptoms have improved over the past 2 to 3 weeks, appetite much better, gaining weight and has not had any vomiting for the past week and a half  Etiology of symptoms is not clear,  she did have negative upper abdominal ultrasound August 2023. She is diabetic, consider diabetic gastroparesis, rule out underlying malignancy, rule out other systemic illness, rule out hyperthyroidism  #2 history of Barrett's esophagus-overdue for surveillance EGD #3 history of adenomatous colon polyps-last colonoscopy 2015 advised to have 5-year interval follow-up. #4 mild neurocognitive disorder #5 adult onset diabetes mellitus  Plan; CBC with differential, be met, TSH, sed rate, CRP Patient will be scheduled for CT of the abdomen and pelvis with contrast CT is unremarkable consider gastric emptying scan Would like her to restart omeprazole 20 mg p.o. every morning Follow-up with Dr. Carlean Purl scheduled for January, I am happy to see her in the interim  Further recommendations pending results of labs and CT.  Nichole Neyer Genia Harold PA-C 12/01/2021   Cc: Bernerd Limbo, MD

## 2021-12-08 ENCOUNTER — Emergency Department (HOSPITAL_COMMUNITY): Payer: Medicare PPO

## 2021-12-08 ENCOUNTER — Encounter (HOSPITAL_COMMUNITY): Payer: Self-pay

## 2021-12-08 ENCOUNTER — Other Ambulatory Visit: Payer: Self-pay

## 2021-12-08 ENCOUNTER — Emergency Department (HOSPITAL_COMMUNITY)
Admission: EM | Admit: 2021-12-08 | Discharge: 2021-12-08 | Disposition: A | Discharge status: Home / Self Care | Payer: Medicare PPO | Attending: Emergency Medicine | Admitting: Emergency Medicine

## 2021-12-08 DIAGNOSIS — Y9301 Activity, walking, marching and hiking: Secondary | ICD-10-CM | POA: Insufficient documentation

## 2021-12-08 DIAGNOSIS — W1781XA Fall down embankment (hill), initial encounter: Secondary | ICD-10-CM | POA: Insufficient documentation

## 2021-12-08 DIAGNOSIS — Z23 Encounter for immunization: Secondary | ICD-10-CM | POA: Diagnosis not present

## 2021-12-08 DIAGNOSIS — S0990XA Unspecified injury of head, initial encounter: Secondary | ICD-10-CM

## 2021-12-08 DIAGNOSIS — R109 Unspecified abdominal pain: Secondary | ICD-10-CM | POA: Diagnosis not present

## 2021-12-08 DIAGNOSIS — S065X0A Traumatic subdural hemorrhage without loss of consciousness, initial encounter: Secondary | ICD-10-CM | POA: Insufficient documentation

## 2021-12-08 DIAGNOSIS — Y92828 Other wilderness area as the place of occurrence of the external cause: Secondary | ICD-10-CM | POA: Insufficient documentation

## 2021-12-08 DIAGNOSIS — S0083XA Contusion of other part of head, initial encounter: Secondary | ICD-10-CM | POA: Insufficient documentation

## 2021-12-08 DIAGNOSIS — E119 Type 2 diabetes mellitus without complications: Secondary | ICD-10-CM | POA: Insufficient documentation

## 2021-12-08 DIAGNOSIS — Z7984 Long term (current) use of oral hypoglycemic drugs: Secondary | ICD-10-CM | POA: Diagnosis not present

## 2021-12-08 DIAGNOSIS — I6200 Nontraumatic subdural hemorrhage, unspecified: Secondary | ICD-10-CM

## 2021-12-08 DIAGNOSIS — W19XXXA Unspecified fall, initial encounter: Secondary | ICD-10-CM

## 2021-12-08 LAB — URINALYSIS, ROUTINE W REFLEX MICROSCOPIC
Bacteria, UA: NONE SEEN
Bilirubin Urine: NEGATIVE
Glucose, UA: NEGATIVE mg/dL
Hgb urine dipstick: NEGATIVE
Ketones, ur: NEGATIVE mg/dL
Leukocytes,Ua: NEGATIVE
Nitrite: NEGATIVE
Protein, ur: 30 mg/dL — AB
Specific Gravity, Urine: >1.046 — ABNORMAL HIGH (ref 1.005–1.030)
pH: 7 (ref 5.0–8.0)

## 2021-12-08 LAB — CBC WITH DIFFERENTIAL/PLATELET
Abs Immature Granulocytes: 0.02 10*3/uL (ref 0.00–0.07)
Basophils Absolute: 0.1 10*3/uL (ref 0.0–0.1)
Basophils Relative: 1 %
Eosinophils Absolute: 0.1 10*3/uL (ref 0.0–0.5)
Eosinophils Relative: 1 %
HCT: 40.8 % (ref 36.0–46.0)
Hemoglobin: 13.2 g/dL (ref 12.0–15.0)
Immature Granulocytes: 0 %
Lymphocytes Relative: 16 %
Lymphs Abs: 1.2 10*3/uL (ref 0.7–4.0)
MCH: 26.8 pg (ref 26.0–34.0)
MCHC: 32.4 g/dL (ref 30.0–36.0)
MCV: 82.9 fL (ref 80.0–100.0)
Monocytes Absolute: 0.8 10*3/uL (ref 0.1–1.0)
Monocytes Relative: 10 %
Neutro Abs: 5.7 10*3/uL (ref 1.7–7.7)
Neutrophils Relative %: 72 %
Platelets: 277 10*3/uL (ref 150–400)
RBC: 4.92 MIL/uL (ref 3.87–5.11)
RDW: 14.1 % (ref 11.5–15.5)
WBC: 7.8 10*3/uL (ref 4.0–10.5)
nRBC: 0 % (ref 0.0–0.2)

## 2021-12-08 LAB — COMPREHENSIVE METABOLIC PANEL
ALT: 23 U/L (ref 0–44)
AST: 30 U/L (ref 15–41)
Albumin: 4.1 g/dL (ref 3.5–5.0)
Alkaline Phosphatase: 77 U/L (ref 38–126)
Anion gap: 14 (ref 5–15)
BUN: 16 mg/dL (ref 8–23)
CO2: 30 mmol/L (ref 22–32)
Calcium: 10.2 mg/dL (ref 8.9–10.3)
Chloride: 99 mmol/L (ref 98–111)
Creatinine, Ser: 0.72 mg/dL (ref 0.44–1.00)
GFR, Estimated: >60 mL/min (ref >60)
Glucose, Bld: 113 mg/dL — ABNORMAL HIGH (ref 70–99)
Potassium: 3.8 mmol/L (ref 3.5–5.1)
Sodium: 143 mmol/L (ref 135–145)
Total Bilirubin: 0.4 mg/dL (ref 0.3–1.2)
Total Protein: 7.2 g/dL (ref 6.5–8.1)

## 2021-12-08 MED ORDER — IOHEXOL 300 MG/ML  SOLN
100.0000 mL | Freq: Once | INTRAMUSCULAR | Status: AC | PRN
  Administered 2021-12-08: 100 mL via INTRAVENOUS

## 2021-12-08 MED ORDER — TETANUS-DIPHTH-ACELL PERTUSSIS 5-2.5-18.5 LF-MCG/0.5 IM SUSY
0.5000 mL | PREFILLED_SYRINGE | Freq: Once | INTRAMUSCULAR | Status: AC
  Administered 2021-12-08: 0.5 mL via INTRAMUSCULAR
  Filled 2021-12-08: qty 0.5

## 2021-12-08 MED ORDER — SODIUM CHLORIDE (PF) 0.9 % IJ SOLN
INTRAMUSCULAR | Status: AC
  Filled 2021-12-08: qty 50

## 2021-12-08 NOTE — ED Provider Triage Note (Cosign Needed Addendum)
Emergency Medicine Provider Triage Evaluation Note  Jill Chandler , a 76 y.o. female  was evaluated in triage.  Pt complains of falling yesterday while walking up a hill. Reports chronic dizziness and abnormal gait yesterday. No LOC. No BT. Reports facial pain and neck pain. Reports intermittent confusion that comes and goes. Unknown last tetanus shot.   Review of Systems  Positive: Abnormal gait, dizziness Negative: N/V  Physical Exam  BP 116/63   Pulse 86   Temp 97.6 F (36.4 C) (Oral)   Resp 17   SpO2 97%  Gen:   Awake, no distress   Resp:  Normal effort  MSK:   Moves extremities without difficulty  Other:  Abrasion to L orbit and above L lip, ttp of left orbit, Cspine  Medical Decision Making  Medically screening exam initiated at 1:04 PM.  Appropriate orders placed.  [name] was informed that the remainder of the evaluation will be completed by another provider, this initial triage assessment does not replace that evaluation, and the importance of remaining in the ED until their evaluation is complete.  Alerted by radiologist for subdural bleed w/midline shift. Alerted triage RN--pt transferred to [facility]. MD in back alerted     [name], PA 12/08/21 1309    [name], [place] 12/08/21 1355

## 2021-12-08 NOTE — ED Notes (Signed)
Brother walks out of room and walks to ED room 16 and starts to open door, this RN ask "sir can I help you". Brother states " well I think my sister left her belongings in this room before she came to current room she is in" explained to brother that he can't go in other rooms because there is other people in them rooms and just to ask staff so we can help, explained that patient was in triage 4 prior to [facility]. Brother is persistent that she was in a room back here prior to [facility] and nurse explained they was in triage. Brother walks off.

## 2021-12-08 NOTE — Discharge Instructions (Signed)
There was a small amount of bleeding today on your brain but it looks to be stable.  If you start having vision problems, worsening of your gait, inability to stay awake or vomiting that is uncontrolled you should return to the emergency room.  Otherwise you need to follow-up with the neurosurgeon in 1 week.  It is very important that you also follow-up with gastroenterology and neurology to treat your more chronic issues.  Make sure you are also using Ensure to increase your calories and protein to not lose any further weight.

## 2021-12-08 NOTE — ED Provider Notes (Signed)
[facility] DEPT Provider Note   CSN: [id number] Arrival date & time: 12/08/21  1225     History  Chief Complaint  Patient presents with   Fall   Head Injury    Jill Chandler is a 76 y.o. female.  Patient is a 76 year old female with a history of Bowen's disease, GERD, Barrett's esophagus, diabetes, mild neurocognitive disorder who is presenting today for evaluation after a fall yesterday.  Patient reports that they were at a park walking the dog and she started going downhill too quickly lost her balance falling forward hitting a hard surface over the left side of her head.  Her brother was present during this and denied any loss of consciousness but he did have to help her up.  She denies taking any anticoagulation has had no change in her vision.  However talking with the patient and her brother for some time now she has had worsening balance, intermittent confusion and has not seemed to be 100%.  Her brother reports he thinks its been over the last 8 months but could have been longer.  He is visiting from [place] and has not seen her in 2 years but does talk to her regularly on the phone.  She reports in the last 3 months she has had a 20 pound weight loss because nothing tastes good and she has had no appetite.  She does have intermittent vomiting as well.  She has followed up with a gastroenterologist this month and had lab work done last week that showed a normal sed rate, CRP, TSH and she is planned to have a CAT scan on the 16th of this month.  She did see neurology at the beginning of the year in February and at that time was diagnosed with mild cognitive delay and was started on Aricept.  She denies any recent medication changes.  She has not had any recent infections.  She reports she has had multiple frequent falls but this is the first when she can remember that she hit her head.  She takes no anticoagulation.  The history is provided by the patient  (Brother).  Fall  Head Injury      Home Medications Prior to Admission medications   Medication Sig Start Date End Date Taking? Authorizing Provider  amphetamine-dextroamphetamine (ADDERALL) 20 MG tablet Take 20 mg by mouth 2 (two) times daily.    [provider]  blood glucose meter kit and supplies KIT Use to test blood sugar TID. 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     [provider]  donepezil (ARICEPT) 10 MG tablet Take 1 tablet (10 mg total) by mouth at bedtime. 03/24/21 06/22/21  Alric Ran, MD  ferrous sulfate 324 (65 Fe) MG TBEC Take 1 tablet by mouth daily.     [provider]  fish oil-omega-3 fatty acids 1000 MG capsule Take 1 g by mouth daily.     [provider]  FLUoxetine (PROZAC) 40 MG capsule Take 80 mg by mouth daily.    [provider]  glucose blood (ONETOUCH VERIO) test strip Use to check blood sugar twice a day DX:E11.8 05/31/17   Janith Lima, MD  ibandronate (BONIVA) 150 MG tablet Take 1 tablet (150 mg total)  by mouth every 30 (thirty) days. See instructions. 05/11/19   Renato Shin, MD  metFORMIN (GLUCOPHAGE-XR) 500 MG 24 hr tablet Take 500 mg by mouth daily. 01/15/19   [provider]  metroNIDAZOLE (METROGEL) 0.75 % gel Apply 1 application topically 2 (two) times daily. 07/27/17   Janith Lima, MD  omeprazole (PRILOSEC) 20 MG capsule Take 1 capsule (20 mg total) by mouth daily. 05/31/17   Janith Lima, MD  simvastatin (ZOCOR) 40 MG tablet Take 1 tablet (40 mg total) by mouth at bedtime. 06/01/17   Janith Lima, MD  traZODone (DESYREL) 150 MG tablet Take 1 tablet (150 mg total) by mouth at bedtime. TAKE 1 TABLET BY MOUTH AT BEDTIME AS NEEDED FOR SLEEP 05/24/17   Janith Lima, MD      Allergies    Patient has no  known allergies.    Review of Systems   Review of Systems  Physical Exam Updated Vital Signs BP (!) 137/92   Pulse 83   Temp 99 F (37.2 C) (Oral)   Resp 18   Ht _0  (1.575 m)   Wt 41.3 kg   SpO2 100%   BMI 16.64 kg/m  Physical Exam Vitals and nursing note reviewed.  Constitutional:      General: She is not in acute distress.    Appearance: She is well-developed.  HENT:     Head: Normocephalic and atraumatic.  Eyes:     Conjunctiva/sclera: Conjunctivae normal.     Pupils: Pupils are equal, round, and reactive to light.  Cardiovascular:     Rate and Rhythm: Normal rate and regular rhythm.     Heart sounds: No murmur heard. Pulmonary:     Effort: Pulmonary effort is normal. No respiratory distress.     Breath sounds: Normal breath sounds. No wheezing or rales.  Abdominal:     General: There is no distension.     Palpations: Abdomen is soft.     Tenderness: There is no abdominal tenderness. There is no guarding or rebound.  Musculoskeletal:        General: No tenderness. Normal range of motion.     Cervical back: Normal range of motion and neck supple.  Skin:    General: Skin is warm and dry.     Findings: No erythema or rash.  Neurological:     Mental Status: She is alert.     Comments: Oriented to person place and day of the week but cannot tell me the date.  No notable pronator drift in all 4 extremities.  5 out of 5 strength in all 4 extremities.  Normal finger-to-nose and heel-to-shin testing  Psychiatric:        Behavior: Behavior normal.     ED Results / Procedures / Treatments   Labs (all labs ordered are listed, but only abnormal results are displayed) Labs Reviewed  COMPREHENSIVE METABOLIC PANEL - Abnormal; Notable for the following components:      Result Value   Glucose, Bld 113 (*)    All other components within normal limits  CBC WITH DIFFERENTIAL/PLATELET  URINALYSIS, ROUTINE W REFLEX MICROSCOPIC    EKG None  Radiology CT ABDOMEN PELVIS  W CONTRAST  Result Date: 12/08/2021 CLINICAL DATA:  Nausea, vomiting, weight loss unintentional. EXAM: CT ABDOMEN AND PELVIS WITH CONTRAST TECHNIQUE: Multidetector CT imaging of the abdomen and pelvis was performed using the standard protocol following bolus administration of intravenous contrast. RADIATION DOSE REDUCTION: This exam was performed according to the  departmental dose-optimization program which includes automated exposure control, adjustment of the mA and/or kV according to patient size and/or use of iterative reconstruction technique. CONTRAST:  187m OMNIPAQUE IOHEXOL 300 MG/ML  SOLN COMPARISON:  CT October 28, 2015. FINDINGS: Lower chest: No acute abnormality. Hepatobiliary: No suspicious hepatic lesion. Gallbladder is unremarkable. No biliary ductal dilation. Pancreas: No pancreatic ductal dilation or evidence of acute inflammation. Spleen: No splenomegaly or focal splenic lesion. Adrenals/Urinary Tract: Bilateral adrenal glands appear normal. No hydronephrosis. Kidneys demonstrate symmetric enhancement. No solid enhancing renal mass. Urinary bladder is unremarkable for degree of distension. Stomach/Bowel: Stomach is distended with ingested material and gas with possible focal wall thickening of the gastric antrum/pylorus. No pathologic dilation small or large bowel. Diffuse wall thickening of the small bowel. Sigmoid colonic diverticulosis without findings of acute diverticulitis. Vascular/Lymphatic: Aortic atherosclerosis. Normal caliber abdominal aorta. No pathologically enlarged abdominal or pelvic lymph nodes. Reproductive: Enhancing nodularity in the uterine fundus, decreased in size from 2017, likely reflects uterine leiomyomas. No suspicious adnexal mass. Other: No significant abdominopelvic free fluid. Musculoskeletal: Multilevel degenerative changes spine. Mild degenerative changes bilateral hips and SI joints. IMPRESSION: 1. Mild diffuse wall thickening of the small bowel, may reflect a  mild infectious or inflammatory enteritis. 2. Stomach is distended with ingested material and gas with possible focal wall thickening of the gastric antrum/pylorus. Consider further evaluation with endoscopy 3. Colonic diverticulosis without findings of acute diverticulitis. 4. Enhancing nodularity in the uterine fundus, decreased in size from 2017, likely reflects uterine leiomyomas. 5.  Aortic Atherosclerosis (ICD10-I70.0). Electronically Signed   By: JDahlia BailiffM.D.   On: 12/08/2021 16:39   CT Cervical Spine Wo Contrast  Result Date: 12/08/2021 CLINICAL DATA:  Dizzy.  Syncopal episode and fall. EXAM: CT CERVICAL SPINE WITHOUT CONTRAST TECHNIQUE: Multidetector CT imaging of the cervical spine was performed without intravenous contrast. Multiplanar CT image reconstructions were also generated. RADIATION DOSE REDUCTION: This exam was performed according to the departmental dose-optimization program which includes automated exposure control, adjustment of the mA and/or kV according to patient size and/or use of iterative reconstruction technique. COMPARISON:  None Available. FINDINGS: Alignment: MRI of the cervical spine 03/01/2005 Skull base and vertebrae: Craniocervical junction demonstrates some degenerative change. Vertebral body heights are maintained. No acute fractures are present. Chronic endplate marrow changes are present at C5-6, left greater than right. Soft tissues and spinal canal: No prevertebral fluid or swelling. No visible canal hematoma. Disc levels: Asymmetric uncovertebral spurring at C5-6 results in moderate left foraminal stenosis. No other focal stenosis is present. Upper chest: Lung apices are clear. The thoracic inlet is within normal limits. IMPRESSION: 1. No acute fracture or traumatic subluxation. 2. Degenerative changes of the cervical spine are most pronounced at C5-6 where there is moderate left foraminal stenosis. Electronically Signed   By: CSan MorelleM.D.   On:  12/08/2021 13:57   CT Maxillofacial Wo Contrast  Result Date: 12/08/2021 CLINICAL DATA:  Fall.  Facial trauma. EXAM: CT MAXILLOFACIAL WITHOUT CONTRAST TECHNIQUE: Multidetector CT imaging of the maxillofacial structures was performed. Multiplanar CT image reconstructions were also generated. RADIATION DOSE REDUCTION: This exam was performed according to the departmental dose-optimization program which includes automated exposure control, adjustment of the mA and/or kV according to patient size and/or use of iterative reconstruction technique. COMPARISON:  Maxillofacial CT 03/21/2017. FINDINGS: Osseous: No acute fractures are present. Mandible is intact and located. Orbits: Left infraorbital soft tissue swelling is present. No underlying fracture or foreign body is present. Bilateral lens replacements  are noted. Globes and orbits are otherwise unremarkable. Sinuses: The paranasal sinuses and mastoid air cells are clear. Soft tissues: No other significant extracranial soft tissue injury is present. Limited intracranial: Small left subdural hematoma is better described on the head CT of the same day. IMPRESSION: 1. Left infraorbital soft tissue swelling without underlying fracture or foreign body. 2. Small left subdural hematoma is better described on the head CT of the same day. Critical Value/emergent results were called by telephone at the time of interpretation on 12/08/2021 at 1:50pm to provider BROOKE SMALL , who verbally acknowledged these results. Electronically Signed   By: San Morelle M.D.   On: 12/08/2021 13:54   CT Head Wo Contrast  Result Date: 12/08/2021 CLINICAL DATA:  Fall today.  Dizzy. EXAM: CT HEAD WITHOUT CONTRAST TECHNIQUE: Contiguous axial images were obtained from the base of the skull through the vertex without intravenous contrast. RADIATION DOSE REDUCTION: This exam was performed according to the departmental dose-optimization program which includes automated exposure  control, adjustment of the mA and/or kV according to patient size and/or use of iterative reconstruction technique. COMPARISON:  CT head without contrast 03/11/2017. MRI of the head 06/12/2019 FINDINGS: Brain: 3.5 mm extra-axial hemorrhage is present over the left convexity, likely subdural. This creates some mass effect with 2 mm of left to right midline shift. Minimal effacement of the posterior horn of the left lateral ventricle noted. No acute parenchymal hemorrhage is present. Mild scattered white matter disease is present. Deep brain nuclei are within normal limits. Ventricles are otherwise within normal limits. No other significant extra-axial fluid collection is present. The brainstem and cerebellum are within normal limits. Vascular: Atherosclerotic calcifications are present within the cavernous internal carotid arteries. No hyperdense vessel is present. Skull: Calvarium is intact. No focal lytic or blastic lesions are present. No significant extracranial soft tissue lesion is present. Sinuses/Orbits: The paranasal sinuses and mastoid air cells are clear. Bilateral lens replacements are noted. Globes and orbits are otherwise unremarkable. IMPRESSION: 1. 3.5 mm extra-axial hemorrhage over the left convexity, likely subdural. This creates some mass effect with 2 mm of left to right midline shift. 2. Minimal effacement of the posterior horn of the left lateral ventricle. 3. Mild scattered white matter disease likely reflects the sequela of chronic microvascular ischemia. Critical Value/emergent results were called by telephone at the time of interpretation on 12/08/2021 at 1:50 pm to provider BROOKE SMALL , who verbally acknowledged these results. Electronically Signed   By: San Morelle M.D.   On: 12/08/2021 13:51   DG Chest 1 View  Result Date: 12/08/2021 CLINICAL DATA:  Provided history: Confusion. Additional history provided: Fall yesterday, dizziness, abnormal gait. EXAM: CHEST  1 VIEW  COMPARISON:  Prior chest radiographs 03/21/2017 and earlier. FINDINGS: Heart size within normal limits. No appreciable airspace consolidation. No evidence of pleural effusion or pneumothorax. No acute bony abnormality identified. IMPRESSION: No evidence of active cardiopulmonary disease. Electronically Signed   By: Kellie Simmering D.O.   On: 12/08/2021 13:29    Procedures Procedures    Medications Ordered in ED Medications  sodium chloride (PF) 0.9 % injection (  Not Given 12/08/21 1638)  Tdap (BOOSTRIX) injection 0.5 mL (0.5 mLs Intramuscular Given 12/08/21 1403)  iohexol (OMNIPAQUE) 300 MG/ML solution 100 mL (100 mLs Intravenous Contrast Given 12/08/21 1608)    ED Course/ Medical Decision Making/ A&P  Medical Decision Making Amount and/or Complexity of Data Reviewed External Data Reviewed: notes. Labs: ordered. Decision-making details documented in ED Course. Radiology: ordered and independent interpretation performed. Decision-making details documented in ED Course.  Risk Prescription drug management.   Pt with multiple medical problems and comorbidities and presenting today with a complaint that caries a high risk for morbidity and mortality.  Here today after a fall and injury to her head.  However over the last 8 months she has had waxing and waning mental status, more frequent falls and disequilibrium.  Her brother is currently here and reports that he has noticed waxing and waning mental status.  Some days she seems very confused and other times she is more lucid.  He has not noticed a significant change since her fall yesterday.  She does not take any anticoagulation.  Neuro exam is nonfocal here.  She has been following up with GI and neurology.  I consulted neurosurgery and they evaluated the patient's images and report that at this time she is cleared per their perspective.  She does not need any further imaging as she is 24 hours after her fall and she  takes no anticoagulation.  Patient recently had labs done by GI with normal TSH ESR and CRP.  She was supposed to have a CT of her abdomen pelvis due to the weight loss and intermittent vomiting.  We will go ahead and get that today.  Patient otherwise appears nontoxic.  I have independently visualized and interpreted pt's images today.  Head CT with evidence of small subdural but no significant shift.  CT of the face negative for fractures and cervical spine is negative for fracture. I independently interpreted patient's labs and CBC, CMP are within normal limits.  CT of the abdomen pelvis showed mild diffuse wall thickening of the small bowel which could be inflammatory enteritis as well as stomach is distended with ingested material and gas with possible focal wall thickening of the gastric antrum and pylorus and recommended endoscopy in the future.  Enhancing nodularity in the fundus which is decreased in size and colonic diverticulosis without diverticulitis.  Patient symptoms of vomiting and decreased oral intake and weight loss have been ongoing now for months.  Do not feel like this is an acute issue that would warrant admission to the hospital.  Sent a message to her GI provider who just saw her last week about follow-up and endoscopy.  Discussed with her in addition to eating also supplementing with Ensure for calories and protein.  Patient will need to follow-up with neurosurgery in 1 week and then was given a new ambulatory referral to neurology as well.  All the findings discussed with the patient and her brother.  They are comfortable with this plan.         Final Clinical Impression(s) / ED Diagnoses Final diagnoses:  Injury of head, initial encounter  Fall, initial encounter  Contusion of face, initial encounter  Subdural hemorrhage ([place])    Rx / DC Orders ED Discharge Orders          Ordered    Ambulatory referral to Neurology       Comments: An appointment is requested in  approximately: 1 week  increased confusion, worsening gait and falls   12/08/21 1717              Blanchie Dessert, MD 12/08/21 1720

## 2021-12-08 NOTE — ED Triage Notes (Signed)
Patient reports that she was walking down a steep hill yesterday at a park and fell. Patient bruising ot the left eye area and upper lip. Patient states she has poor vision and states that it is not any worse.  Patient denies taking blood thinners.

## 2021-12-09 ENCOUNTER — Ambulatory Visit (HOSPITAL_COMMUNITY): Payer: Medicare PPO

## 2021-12-11 ENCOUNTER — Ambulatory Visit: Admission: RE | Admit: 2021-12-11 | Payer: Medicare PPO | Source: Ambulatory Visit

## 2022-01-02 ENCOUNTER — Ambulatory Visit: Payer: Medicare PPO | Admitting: Neurology

## 2022-01-02 ENCOUNTER — Encounter: Payer: Self-pay | Admitting: Neurology

## 2022-01-02 VITALS — BP 126/61 | HR 82 | Ht 62.0 in | Wt 99.3 lb

## 2022-01-02 DIAGNOSIS — G301 Alzheimer's disease with late onset: Secondary | ICD-10-CM

## 2022-01-02 DIAGNOSIS — F02A0 Dementia in other diseases classified elsewhere, mild, without behavioral disturbance, psychotic disturbance, mood disturbance, and anxiety: Secondary | ICD-10-CM | POA: Diagnosis not present

## 2022-01-02 MED ORDER — MEMANTINE HCL 10 MG PO TABS: 10.0000 mg | ORAL_TABLET | Freq: Two times a day (BID) | ORAL | 3 refills | Status: AC

## 2022-01-02 NOTE — Progress Notes (Signed)
[facility]  PATIENT: Jill Chandler DOB: December 18, 1945  REQUESTING CLINICIAN: Blanchie Dessert, MD HISTORY FROM: Patient  REASON FOR VISIT: memory decline    HISTORICAL  CHIEF COMPLAINT:  Chief Complaint  Patient presents with   Follow-up    Rm 12 alone; reports memory has declined since last visit.    INTERVAL HISTORY 01/02/22:  Patient presents today for follow-up, last visit was in February.  At that time, we started her on Aricept.  She reported initially she felt like Aricept was helping but now she feels like her memory is getting worse.  She is extremely forgetful she reported she "just cannot remember".  She also stated that she get lost while going to familiar places and she is trying to moved to [place] near her family for support   INTERVAL HISTORY 03/24/21 Patient presents today for follow-up, last seen was in June 2021 by Dr. Jannifer Franklin.  At that time she was diagnosed with cognitive impairment but not started on any medication.  Patient reported she lives alone, and she has not been doing well in terms of her memory. She is more forgetful, she is getting lost in familiar places, reported on today's visit she got lost coming here and she could not remember her appointment time, and also she could not find the keys.  She reported her symptoms are getting worse in the past 6 months.  Last month, she lost her car in the parking lot.  She lives alone, reported she is a vegetarian does not do a lot of cooking.  Most of her bills are paid automatically, she it takes a lot of time to perform activities like cleaning.   TBI:  No past history of TBI Stroke:  no past history of stroke Seizures:  no past history of seizures Sleep:  no history of sleep apnea.  Mood:  patient denies anxiety and depression  Functional status: independent in all  ADLs Patient lives with alone  Cooking: Yes, she is vegetarian, simple meal plan  Cleaning: Yes but with difficulty   Shopping: Ok Bathing: patient  Toileting: patient  Driving: yes but getting lost a lot  Bills: No Ever left the stove on by accident?: not using the stove  Forget how to use items around the house?: yes  Getting lost going to familiar places?: yes  Forgetting loved ones names?: Yes sometimes  Word finding difficulty? Yes  Sleep: gets about 6 hrs per night     HISTORY OF PRESENT ILLNESS:  From Dr Jannifer Franklin 07/04/2019 Jill Chandler is a 76 year old right-handed white female with a history mild cognitive impairment.  The patient does have a prior history of depression and ADD.  She currently is on 3 different antidepressant medications and she takes Adderall.  The patient currently is not working, she lost her job with the Covid pandemic and has never returned.  Over the last 18 months she has noted some increased problems with cognitive decline.  She is having increasing difficulty using technology such as a computer or cell phone, she has difficulty with problem-solving and difficulty with short-term memory.  She will misplace things about the house frequently.  She is still operating a motor vehicle without difficulty, she is able to manage her medications and appointments and do her finances.  She has not given up any activities of daily living because of memory.  She claims that she is sleeping fairly well, she has a good energy level during the day.  She has  also noted some changes in balance, she has fallen on several occasions, the last fall was several months ago.  The patient has injured her right shoulder but has not yet seen an orthopedic surgeon for this.  The patient denies any numbness in the feet or burning or stinging in the feet.  She comes to this office for further evaluation.  OTHER MEDICAL CONDITIONS: ADD, Depression, Diabetes, Hyperlipidemia    REVIEW OF SYSTEMS: Full 14 system review of systems performed and negative with exception of: as noted in the HPI   ALLERGIES: No Known  Allergies  HOME MEDICATIONS: Outpatient Medications Prior to Visit  Medication Sig Dispense Refill   amphetamine-dextroamphetamine (ADDERALL) 5 MG tablet Take 10 mg by mouth daily.     blood glucose meter kit and supplies KIT Use to test blood sugar TID. dxE11.8 1 each 1   buPROPion (WELLBUTRIN XL) 150 MG 24 hr tablet TAKE 1 TABLET BY MOUTH EVERY MORNING 90 tablet 1   Calcium Carbonate-Vitamin D (CALCIUM + D PO) Take 1 tablet by mouth daily.      fish oil-omega-3 fatty acids 1000 MG capsule Take 1 g by mouth daily.      FLUoxetine (PROZAC) 40 MG capsule Take 80 mg by mouth daily.     glucose blood (ONETOUCH VERIO) test strip Use to check blood sugar twice a day DX:E11.8 200 each 1   ibandronate (BONIVA) 150 MG tablet Take 1 tablet (150 mg total) by mouth every 30 (thirty) days. See instructions. 3 tablet 3   metFORMIN (GLUCOPHAGE-XR) 500 MG 24 hr tablet Take 500 mg by mouth daily.     metroNIDAZOLE (METROGEL) 0.75 % gel Apply 1 application topically 2 (two) times daily. 45 g 5   omeprazole (PRILOSEC) 20 MG capsule Take 1 capsule (20 mg total) by mouth daily. 90 capsule 1   simvastatin (ZOCOR) 40 MG tablet Take 1 tablet (40 mg total) by mouth at bedtime. 90 tablet 1   traZODone (DESYREL) 150 MG tablet Take 1 tablet (150 mg total) by mouth at bedtime. TAKE 1 TABLET BY MOUTH AT BEDTIME AS NEEDED FOR SLEEP 90 tablet 1   donepezil (ARICEPT) 10 MG tablet Take 1 tablet (10 mg total) by mouth at bedtime. 90 tablet 3   amphetamine-dextroamphetamine (ADDERALL) 20 MG tablet Take 20 mg by mouth 2 (two) times daily.     ferrous sulfate 324 (65 Fe) MG TBEC Take 1 tablet by mouth daily.  (Patient not taking: Reported on 01/02/2022)     No facility-administered medications prior to visit.    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  s/p  partial vulvectomy--- per pt  no issue since   Folic acid deficiency    Generalized anxiety disorder    GERD (gastroesophageal reflux disease)    History of adenomatous polyp of colon    TUBULAR ADENOM'S   History of cervical dysplasia    History of ovarian cystectomy    approx. 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   Uterine fibroid     PAST SURGICAL HISTORY: Past Surgical History:  Procedure Laterality Date   ANTERIOR CERVICAL DECOMP/DISCECTOMY FUSION  11-12-2005  dr Vertell Limber   C3--4   BREAST REDUCTION SURGERY Bilateral 11-24-2004   dr Georgia Lopes   CATARACT EXTRACTION W/ INTRAOCULAR LENS  IMPLANT, BILATERAL  early 2000s approx.   COLONOSCOPY  last one 02-24-2013   ESOPHAGOGASTRODUODENOSCOPY  12/02/2015   LAPAROSCOPIC BILATERAL SALPINGO OOPHERECTOMY Bilateral 10/06/2016   Procedure: LAPAROSCOPIC BILATERAL SALPINGO OOPHORECTOMY;  Surgeon: Paula Compton, MD;  Location: [facility];  Service: Gynecology;  Laterality: Bilateral;   LEEP  YRS AGO   cervical dysplasia   OVARIAN CYST REMOVAL  1981 approx.   TRANSTHORACIC ECHOCARDIOGRAM  04/11/2008   ef 44-03%, grade 1 diastolic dysfunction   VULVECTOMY PARTIAL  1978  approx.    FAMILY HISTORY: Family History  Problem Relation Age of Onset   Breast cancer Sister 24   Diabetes Sister    Hypertension Sister    Hyperlipidemia Sister    Breast cancer Sister 46   Heart disease Mother    Depression Mother    Osteoporosis Sister    Hypertension Father    Alcohol abuse Father    Drug abuse Father    Meniere's disease Father    Hypertension Brother    Cancer Neg Hx    Early death Neg Hx    Hearing loss Neg Hx    Kidney disease Neg Hx    Learning disabilities Neg Hx    Stroke Neg Hx    Colon cancer Neg Hx     SOCIAL HISTORY: Social History   Socioeconomic History   Marital status: Single    Spouse name: Not on file   Number of children: 0   Years of education: 18   Highest education level: Master's degree (e.g., MA, MS, MEng, MEd, MSW, MBA)  Occupational History   Occupation: Retired    Comment: Psychotherapist  Tobacco Use   Smoking status: Former    Years: 30.00    Types: Cigarettes    Quit date: 09/07/1998    Years since quitting: 23.3   Smokeless tobacco: Never  Vaping Use   Vaping Use: Never used  Substance and Sexual Activity   Alcohol use: Yes    Comment: Occasionally   Drug use: No   Sexual activity: Yes    Birth control/protection: Post-menopausal  Other Topics Concern   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 3 caffeinated beverages daily.   Retired 03/2018   Lives alone.   Right-handed.   Caffeine use: one cup daily.      Social Determinants of Health   Financial Resource Strain: Not on file  Food Insecurity: Not on file  Transportation Needs: Not on file  Physical Activity: Not on file  Stress: Not on file  Social Connections: Not on file  Intimate Partner Violence: Not on  file    PHYSICAL EXAM  GENERAL EXAM/CONSTITUTIONAL: Vitals:  Vitals:   01/02/22 0800  BP: 126/61  Pulse: 82  Weight: 99 lb 5 oz (45 kg)   Height: _0  (1.575 m)    Body mass index is 18.16 kg/m. Wt Readings from Last 3 Encounters:  01/02/22 99 lb 5 oz (45 kg)  12/08/21 91 lb (41.3 kg)  12/01/21 95 lb (43.1 kg)   Patient is in no distress; well developed, nourished and groomed; neck is supple  EYES: Visual fields full to confrontation, Extraocular movements intacts,   MUSCULOSKELETAL: Gait, strength, tone, movements noted in Neurologic exam below  NEUROLOGIC: MENTAL STATUS:     07/04/2019    8:33 AM 12/14/2017    2:08 PM 05/31/2017    2:44 PM  MMSE - Mini Mental State Exam  Orientation to time _1 Orientation to Place _2 Registration _3 Attention/ Calculation _4 Recall _5 Language- name 2 objects _6 Language- repeat _7 Language- follow 3 step command _8 Language- read & follow direction _9 Write a sentence 1 1 0  Copy design 1 1 0  Total score _10 01/02/2022    8:02 AM 03/24/2021    3:08 PM  Montreal Cognitive Assessment   Visuospatial/ Executive (0/5) 4 5  Naming (0/3) 3 3  Attention: Read list of digits (0/2) 2 2  Attention: Read list of letters (0/1) 1 1  Attention: Serial 7 subtraction starting at 100 (0/3) 2 3  Language: Repeat phrase (0/2) 1 2  Language : Fluency (0/1) 1 1  Abstraction (0/2) 2 2  Delayed Recall (0/5) 0 0  Orientation (0/6) 5 6  Total 21 25  Adjusted Score (based on education)  25    CRANIAL NERVE:  2nd, 3rd, 4th, 6th - visual fields full to confrontation, extraocular muscles intact, no nystagmus 5th - facial sensation symmetric 7th - facial strength symmetric 8th - hearing intact 9th - palate elevates symmetrically, uvula midline 11th - shoulder shrug symmetric 12th - tongue protrusion midline  MOTOR:  normal bulk and tone, full strength in the BUE, BLE  SENSORY:  normal and symmetric to light touch, vibration  COORDINATION:  finger-nose-finger, fine finger movements normal  REFLEXES:  deep tendon reflexes  present and symmetric  GAIT/STATION:  normal   DIAGNOSTIC DATA (LABS, IMAGING, TESTING) - I reviewed patient records, labs, notes, testing and imaging myself where available.  Lab Results  Component Value Date   WBC 7.8 12/08/2021   HGB 13.2 12/08/2021   HCT 40.8 12/08/2021   MCV 82.9 12/08/2021   PLT 277 12/08/2021      Component Value Date/Time   NA 143 12/08/2021 1407   K 3.8 12/08/2021 1407   CL 99 12/08/2021 1407   CO2 30 12/08/2021 1407   GLUCOSE 113 (H) 12/08/2021 1407   BUN 16 12/08/2021 1407   CREATININE 0.72 12/08/2021 1407   CALCIUM 10.2 12/08/2021 1407   PROT 7.2 12/08/2021 1407   ALBUMIN 4.1 12/08/2021 1407   AST 30 12/08/2021 1407   ALT 23 12/08/2021 1407   ALKPHOS 77 12/08/2021 1407   BILITOT 0.4 12/08/2021 1407   GFRNONAA >60 12/08/2021 1407   GFRAA >60 03/21/2017 1617   Lab Results  Component Value Date   CHOL 134 09/21/2016   HDL 48.70  09/21/2016   LDLCALC 60 09/21/2016   LDLDIRECT 72.0 11/05/2015   TRIG 128.0 09/21/2016   CHOLHDL 3 09/21/2016   Lab Results  Component Value Date   HGBA1C 6.1 (A) 04/23/2020   Lab Results  Component Value Date   VITAMINB12 1,511 (H) 07/04/2019   Lab Results  Component Value Date   TSH 1.99 12/01/2021    MRI Brain 05/2019 No acute or reversible finding. Age related volume loss. Mild to moderate chronic small-vessel ischemic changes of the cerebral hemispheric white matter.   ASSESSMENT AND PLAN  76 y.o. year old female with ADD, Depression, Diabetes, Hyperlipidemia and dementia who is presenting for follow-up.  She currently lives alone but feels like her memory getting worse, she is forgetful, reported she just cannot remember.  She also reported getting lost in familiar places.  She is on Aricept 10 mg daily, plan will be to add Namenda and also get ATN profile for confirmation.  She reports planning to move to [place] near family for support.  We discussed additional ways to reduce the progression  of the disease including exercise, physical activity and also mental exercise, doing crossword puzzle and word finding.  We also discussed about diet and sleep.  She is on trazodone for sleep.  Advised patient to contact me if her symptoms get worse otherwise I will see her in a year for follow-up if she has not moved to [place].    1. Mild late onset Alzheimer's dementia without behavioral disturbance, psychotic disturbance, mood disturbance, or anxiety ([place])      Patient Instructions  Will obtain ATN profile Continue with Aricept 10 mg nightly Start Namenda 10 mg twice daily Continue to follow-up with PCP Return in 1 year or sooner if worse.   There are well-accepted and sensible ways to reduce risk for Alzheimers disease and other degenerative brain disorders .  Exercise Daily Walk A daily 20 minute walk should be part of your routine. Disease related apathy can be a significant roadblock to exercise and the only way to overcome this is to make it a daily routine and perhaps have a reward at the end (something your loved one loves to eat or drink perhaps) or a personal trainer coming to the home can also be very useful. Most importantly, the patient is much more likely to exercise if the caregiver / spouse does it with him/her. In general a structured, repetitive schedule is best.  General Health: Any diseases which effect your body will effect your brain such as a pneumonia, urinary infection, blood clot, heart attack or stroke. Keep contact with your primary care doctor for regular follow ups.  Sleep. A good nights sleep is healthy for the brain. Seven hours is recommended. If you have insomnia or poor sleep habits we can give you some instructions. If you have sleep apnea wear your mask.  Diet: Eating a heart healthy diet is also a good idea; fish and poultry instead of red meat, nuts (mostly non-peanuts), vegetables, fruits, olive oil or canola oil (instead of butter), minimal  salt (use other spices to flavor foods), whole grain rice, bread, cereal and pasta and wine in moderation.Research is now showing that the MIND diet, which is a combination of The Mediterranean diet and the DASH diet, is beneficial for cognitive processing and longevity. Information about this diet can be found in The MIND Diet, a book by Doyne Keel, MS, RDN, and online at [employer]book[employer].si  Finances, Power of Attorney and Advance Directives: You  should consider putting legal safeguards in place with regard to financial and medical decision making. While the spouse always has power of attorney for medical and financial issues in the absence of any form, you should consider what you want in case the spouse / caregiver is no longer around or capable of making decisions.  Orders Placed This Encounter  Procedures   ATN PROFILE    Meds ordered this encounter  Medications   memantine (NAMENDA) 10 MG tablet    Sig: Take 1 tablet (10 mg total) by mouth 2 (two) times daily.    Dispense:  180 tablet    Refill:  3    Return in about 1 year (around 01/03/2023).     Alric Ran, MD 01/02/2022, 8:38 AM  [facility] [address], [place], [place] [address] [phone]

## 2022-01-02 NOTE — Patient Instructions (Signed)
Will obtain ATN profile Continue with Aricept 10 mg nightly Start Namenda 10 mg twice daily Continue to follow-up with PCP Return in 1 year or sooner if worse.   There are well-accepted and sensible ways to reduce risk for Alzheimers disease and other degenerative brain disorders .  Exercise Daily Walk A daily 20 minute walk should be part of your routine. Disease related apathy can be a significant roadblock to exercise and the only way to overcome this is to make it a daily routine and perhaps have a reward at the end (something your loved one loves to eat or drink perhaps) or a personal trainer coming to the home can also be very useful. Most importantly, the patient is much more likely to exercise if the caregiver / spouse does it with him/her. In general a structured, repetitive schedule is best.  General Health: Any diseases which effect your body will effect your brain such as a pneumonia, urinary infection, blood clot, heart attack or stroke. Keep contact with your primary care doctor for regular follow ups.  Sleep. A good nights sleep is healthy for the brain. Seven hours is recommended. If you have insomnia or poor sleep habits we can give you some instructions. If you have sleep apnea wear your mask.  Diet: Eating a heart healthy diet is also a good idea; fish and poultry instead of red meat, nuts (mostly non-peanuts), vegetables, fruits, olive oil or canola oil (instead of butter), minimal salt (use other spices to flavor foods), whole grain rice, bread, cereal and pasta and wine in moderation.Research is now showing that the MIND diet, which is a combination of The Mediterranean diet and the DASH diet, is beneficial for cognitive processing and longevity. Information about this diet can be found in The MIND Diet, a book by Doyne Keel, MS, RDN, and online at [employer]  Finances, Power of Attorney and Advance Directives: You should consider putting  legal safeguards in place with regard to financial and medical decision making. While the spouse always has power of attorney for medical and financial issues in the absence of any form, you should consider what you want in case the spouse / caregiver is no longer around or capable of making decisions.

## 2022-01-07 LAB — ATN PROFILE
A -- Beta-amyloid 42/40 Ratio: 0.089 — ABNORMAL LOW (ref >0.102)
Beta-amyloid 40: 139.66 pg/mL
Beta-amyloid 42: 12.4 pg/mL
N -- NfL, Plasma: 10.9 pg/mL — ABNORMAL HIGH (ref 0.00–7.64)
T -- p-tau181: 1.63 pg/mL — ABNORMAL HIGH (ref 0.00–0.97)

## 2022-02-10 ENCOUNTER — Ambulatory Visit: Payer: Medicare PPO | Admitting: Internal Medicine

## 2022-03-24 ENCOUNTER — Ambulatory Visit: Payer: Medicare HMO | Admitting: Neurology
